# Patient Record
Sex: Male | Born: 1960 | Race: Black or African American | Hispanic: No | Marital: Single | Smoking: Former smoker
Health system: Southern US, Community
[De-identification: ages and names within clinical notes are randomized; demographics above are authoritative.]

## PROBLEM LIST (undated history)

## (undated) DIAGNOSIS — N186 End stage renal disease: Secondary | ICD-10-CM

## (undated) DIAGNOSIS — I739 Peripheral vascular disease, unspecified: Secondary | ICD-10-CM

## (undated) DIAGNOSIS — E119 Type 2 diabetes mellitus without complications: Secondary | ICD-10-CM

## (undated) DIAGNOSIS — I10 Essential (primary) hypertension: Secondary | ICD-10-CM

## (undated) DIAGNOSIS — Z992 Dependence on renal dialysis: Secondary | ICD-10-CM

## (undated) HISTORY — DX: Dependence on renal dialysis: Z99.2

## (undated) HISTORY — DX: Peripheral vascular disease, unspecified: I73.9

## (undated) HISTORY — DX: End stage renal disease: N18.6

## (undated) HISTORY — PX: PTCA: SHX146

---

## 2007-07-04 ENCOUNTER — Ambulatory Visit: Payer: Self-pay | Admitting: Family Medicine

## 2007-07-11 ENCOUNTER — Ambulatory Visit: Payer: Self-pay | Admitting: Family Medicine

## 2007-12-26 ENCOUNTER — Ambulatory Visit: Payer: Self-pay | Admitting: Family Medicine

## 2008-01-03 ENCOUNTER — Ambulatory Visit: Payer: Self-pay | Admitting: Family Medicine

## 2008-01-16 ENCOUNTER — Ambulatory Visit: Payer: Self-pay | Admitting: Family Medicine

## 2008-03-11 ENCOUNTER — Emergency Department (HOSPITAL_COMMUNITY): Admission: EM | Admit: 2008-03-11 | Discharge: 2008-03-11 | Payer: Self-pay | Admitting: Emergency Medicine

## 2009-04-30 ENCOUNTER — Ambulatory Visit: Payer: Self-pay | Admitting: Family Medicine

## 2009-11-28 ENCOUNTER — Ambulatory Visit: Payer: Self-pay | Admitting: Family Medicine

## 2011-09-06 LAB — POCT CARDIAC MARKERS: Troponin i, poc: <0.05

## 2011-09-06 LAB — COMPREHENSIVE METABOLIC PANEL
AST: 30
Albumin: 3.7
Alkaline Phosphatase: 48
Chloride: 102
GFR calc Af Amer: >60
Potassium: 3.9
Sodium: 137
Total Bilirubin: 0.9

## 2011-09-06 LAB — URINE CULTURE

## 2011-09-06 LAB — URINALYSIS, ROUTINE W REFLEX MICROSCOPIC
Bilirubin Urine: NEGATIVE
Nitrite: NEGATIVE
Specific Gravity, Urine: 1.019
pH: 6.5

## 2011-09-06 LAB — URINE MICROSCOPIC-ADD ON

## 2011-09-06 LAB — DIFFERENTIAL
Basophils Absolute: 0
Basophils Relative: 0
Eosinophils Relative: 0
Lymphocytes Relative: 20
Monocytes Absolute: 0.2

## 2011-09-06 LAB — CBC
RBC: 4.11 — ABNORMAL LOW
WBC: 11.8 — ABNORMAL HIGH

## 2011-09-06 LAB — CK: Total CK: 896 — ABNORMAL HIGH

## 2012-09-30 ENCOUNTER — Emergency Department (HOSPITAL_COMMUNITY)
Admission: EM | Admit: 2012-09-30 | Discharge: 2012-10-01 | Disposition: A | Discharge status: Home / Self Care | Payer: Self-pay | Attending: Emergency Medicine | Admitting: Emergency Medicine

## 2012-09-30 ENCOUNTER — Encounter (HOSPITAL_COMMUNITY): Payer: Self-pay

## 2012-09-30 DIAGNOSIS — I10 Essential (primary) hypertension: Secondary | ICD-10-CM | POA: Insufficient documentation

## 2012-09-30 DIAGNOSIS — M1711 Unilateral primary osteoarthritis, right knee: Secondary | ICD-10-CM

## 2012-09-30 DIAGNOSIS — F17200 Nicotine dependence, unspecified, uncomplicated: Secondary | ICD-10-CM | POA: Insufficient documentation

## 2012-09-30 DIAGNOSIS — Z79899 Other long term (current) drug therapy: Secondary | ICD-10-CM | POA: Insufficient documentation

## 2012-09-30 DIAGNOSIS — E119 Type 2 diabetes mellitus without complications: Secondary | ICD-10-CM | POA: Insufficient documentation

## 2012-09-30 DIAGNOSIS — M1710 Unilateral primary osteoarthritis, unspecified knee: Secondary | ICD-10-CM | POA: Insufficient documentation

## 2012-09-30 HISTORY — DX: Type 2 diabetes mellitus without complications: E11.9

## 2012-09-30 HISTORY — DX: Essential (primary) hypertension: I10

## 2012-09-30 LAB — GLUCOSE, CAPILLARY: Glucose-Capillary: 148 mg/dL — ABNORMAL HIGH (ref 70–99)

## 2012-09-30 NOTE — ED Notes (Signed)
Pt reports (R) knee pain x1 week, pt denies any injury, pt walking w/a limp in triage.

## 2012-10-01 ENCOUNTER — Emergency Department (HOSPITAL_COMMUNITY): Payer: Self-pay

## 2012-10-01 NOTE — ED Provider Notes (Signed)
History     CSN: [id number]  Arrival date & time 09/30/12  2339   None     Chief Complaint  Patient presents with  . Knee Pain    (Consider location/radiation/quality/duration/timing/severity/associated sxs/prior treatment) HPI History provided by pt.   Pt has had non-traumatic pain in right knee for the past week.  Aggravated by flexion and bearing weight.  Associated w/ edema; no paresthesias, fever, skin changes.  Denies having pain in any other joints.    Past Medical History  Diagnosis Date  . Renal disorder   . Hypertension   . Diabetes mellitus without complication     History reviewed. No pertinent past surgical history.  History reviewed. No pertinent family history.  History  Substance Use Topics  . Smoking status: Current Every Day Smoker  . Smokeless tobacco: Not on file  . Alcohol Use: No      Review of Systems  All other systems reviewed and are negative.    Allergies  Review of patient's allergies indicates no known allergies.  Home Medications   Current Outpatient Rx  Name Route Sig Dispense Refill  . AMLODIPINE BESYLATE 5 MG PO TABS Oral Take 5 mg by mouth daily.    . ASPIRIN EC 81 MG PO TBEC Oral Take 81 mg by mouth daily.    . ENALAPRIL MALEATE 10 MG PO TABS Oral Take 10 mg by mouth daily.    . ERGOCALCIFEROL 50000 UNITS PO CAPS Oral Take 50,000 Units by mouth once a week. Usually on Saturday    . GLYBURIDE 5 MG PO TABS Oral Take 5 mg by mouth daily with breakfast.    . RENA-VITE PO TABS Oral Take 1 tablet by mouth daily.    . TROLAMINE SALICYLATE 10 % EX CREA Topical Apply 1 application topically 3 (three) times daily as needed. arthriitis pain      BP 172/86  Pulse 112  Temp 98.4 F (36.9 C) (Oral)  Resp 18  SpO2 96%  Physical Exam  Nursing note and vitals reviewed. Constitutional: He is oriented to person, place, and time. He appears well-developed and well-nourished. No distress.  HENT:  Head: Normocephalic and atraumatic.    Eyes:       Normal appearance  Neck: Normal range of motion.  Pulmonary/Chest: Effort normal.  Musculoskeletal: Normal range of motion.       Right knee w/out edema, erythema or other skin changes and is not hot to the touch.  Tenderness medial and lateral to patella.  No edema or tenderness of calf.  Full but painful passive flexion.  2+ DP pulse and distal sensation intact.    Neurological: He is alert and oriented to person, place, and time.  Psychiatric: He has a normal mood and affect. His behavior is normal.    ED Course  Procedures (including critical care time)  Labs Reviewed  GLUCOSE, CAPILLARY - Abnormal; Notable for the following:    Glucose-Capillary 148 (*)     All other components within normal limits   Dg Knee 2 Views Right  10/01/2012  *RADIOLOGY REPORT*  Clinical Data: Right knee pain and swelling.  RIGHT KNEE - 1-2 VIEW  Comparison: None.  Findings: Degenerative changes in the right knee with mild medial compartment narrowing.  Osteophytes of the ligamentous insertions on the patella.  No evidence of acute fracture or subluxation.  No focal bone lesion or bone destruction.  Bone cortex and trabecular architecture appear intact.  No significant effusion.  Vascular calcifications.  IMPRESSION: Mild degenerative changes in the right knee.  No acute fractures.   Original Report Authenticated By: Neale Burly, M.D.      1. Arthritis of right knee       MDM  51yo M presents w/ non-traumatic R knee pain.  Xray neg  Other than mild degenerative changes, and no signs of joint infection on exam.  Ortho tech placed in knee sleeve and provided w/ crutches.  Recommended NSAID and RICE.  Referred to ortho for persistent/worsening sx.  Return precautions discussed.         Remer Macho, [place] 10/01/12 [phone]

## 2012-10-01 NOTE — ED Notes (Signed)
Patient currently sitting up in bed; no respiratory or acute distress noted.  Patient updated on plan of care; informed patient that we are currently waiting on disposition from EDP/PA.  Patient denies needs at this time; will continue to monitor.

## 2012-10-01 NOTE — ED Notes (Signed)
Patient currently resting quietly in bed; no respiratory or acute distress noted.  Patient updated on plan of care; informed patient that we are currently waiting for x-ray results to come back.  Patient has no other questions or concerns at this time; will continue to monitor.

## 2012-10-01 NOTE — ED Notes (Signed)
Ortho tech at bedside 

## 2012-10-01 NOTE — ED Notes (Signed)
Patient given copy of discharge paperwork; went over discharge instructions with patient.  Patient instructed to take pain medication (ibuprofen) as directed, to follow up with orthopedist/referral, and to return to the ED for new, worsening, or concerning symptoms.

## 2012-10-01 NOTE — ED Provider Notes (Signed)
Medical screening examination/treatment/procedure(s) were performed by non-physician practitioner and as supervising physician I was immediately available for consultation/collaboration.  Elyn Peers, MD 10/01/12 [phone]

## 2012-10-01 NOTE — ED Notes (Signed)
Patient complaining of right knee pain that started around one week ago; denies any recent injury to knee.  Patient denies swelling and redness.  Limited movement of right knee/leg due to pain.  Patient reports that movement of knee increases pain; currently rates pain 7/10 on the numerical pain scale.  Patient alert and oriented x4; PERRL present.  Will continue to monitor.

## 2012-10-01 NOTE — ED Notes (Signed)
Paged ortho for knee sleeve and crutches (patient states that he is 5'9").  Ortho on way.

## 2016-01-19 ENCOUNTER — Ambulatory Visit (INDEPENDENT_AMBULATORY_CARE_PROVIDER_SITE_OTHER): Payer: Medicaid Other | Admitting: Sports Medicine

## 2016-01-19 ENCOUNTER — Encounter: Payer: Self-pay | Admitting: Sports Medicine

## 2016-01-19 DIAGNOSIS — M79676 Pain in unspecified toe(s): Secondary | ICD-10-CM | POA: Diagnosis not present

## 2016-01-19 DIAGNOSIS — B351 Tinea unguium: Secondary | ICD-10-CM | POA: Diagnosis not present

## 2016-01-19 DIAGNOSIS — E119 Type 2 diabetes mellitus without complications: Secondary | ICD-10-CM

## 2016-01-19 NOTE — Patient Instructions (Addendum)
RECOMMEND O'KEEFFE'S HEALTHY FEET CREAM FOR DRY SKIN  Diabetes and Foot Care Diabetes may cause you to have problems because of poor blood supply (circulation) to your feet and legs. This may cause the skin on your feet to become thinner, break easier, and heal more slowly. Your skin may become dry, and the skin may peel and crack. You may also have nerve damage in your legs and feet causing decreased feeling in them. You may not notice minor injuries to your feet that could lead to infections or more serious problems. Taking care of your feet is one of the most important things you can do for yourself.  HOME CARE INSTRUCTIONS  Wear shoes at all times, even in the house. Do not go barefoot. Bare feet are easily injured.  Check your feet daily for blisters, cuts, and redness. If you cannot see the bottom of your feet, use a mirror or ask someone for help.  Wash your feet with warm water (do not use hot water) and mild soap. Then pat your feet and the areas between your toes until they are completely dry. Do not soak your feet as this can dry your skin.  Apply a moisturizing lotion or petroleum jelly (that does not contain alcohol and is unscented) to the skin on your feet and to dry, brittle toenails. Do not apply lotion between your toes.  Trim your toenails straight across. Do not dig under them or around the cuticle. File the edges of your nails with an emery board or nail file.  Do not cut corns or calluses or try to remove them with medicine.  Wear clean socks or stockings every day. Make sure they are not too tight. Do not wear knee-high stockings since they may decrease blood flow to your legs.  Wear shoes that fit properly and have enough cushioning. To break in new shoes, wear them for just a few hours a day. This prevents you from injuring your feet. Always look in your shoes before you put them on to be sure there are no objects inside.  Do not cross your legs. This may decrease the  blood flow to your feet.  If you find a minor scrape, cut, or break in the skin on your feet, keep it and the skin around it clean and dry. These areas may be cleansed with mild soap and water. Do not cleanse the area with peroxide, alcohol, or iodine.  When you remove an adhesive bandage, be sure not to damage the skin around it.  If you have a wound, look at it several times a day to make sure it is healing.  Do not use heating pads or hot water bottles. They may burn your skin. If you have lost feeling in your feet or legs, you may not know it is happening until it is too late.  Make sure your health care provider performs a complete foot exam at least annually or more often if you have foot problems. Report any cuts, sores, or bruises to your health care provider immediately. SEEK MEDICAL CARE IF:   You have an injury that is not healing.  You have cuts or breaks in the skin.  You have an ingrown nail.  You notice redness on your legs or feet.  You feel burning or tingling in your legs or feet.  You have pain or cramps in your legs and feet.  Your legs or feet are numb.  Your feet always feel cold. SEEK IMMEDIATE  MEDICAL CARE IF:   There is increasing redness, swelling, or pain in or around a wound.  There is a red line that goes up your leg.  Pus is coming from a wound.  You develop a fever or as directed by your health care provider.  You notice a bad smell coming from an ulcer or wound.   This information is not intended to replace advice given to you by your health care provider. Make sure you discuss any questions you have with your health care provider.   Document Released: 11/26/2000 Document Revised: 08/01/2013 Document Reviewed: 05/08/2013 Elsevier Interactive Patient Education [employer].

## 2016-01-19 NOTE — Progress Notes (Signed)
Patient ID: Perry Thomas, male   DOB: March 02, 1961, 55 y.o.   MRN: [id number] Subjective: Perry Thomas is a 55 y.o. male patient with history of type 2 diabetes who presents to office today complaining of long, painful nails  while  ambulating in shoes; unable to trim. Patient states that the glucose reading this morning was 98 mg/dl.  Patient denies any new changes in medication or new problems. Patient denies any new cramping, numbness, burning or tingling in the legs.  There are no active problems to display for this patient.  Current Outpatient Prescriptions on File Prior to Visit  Medication Sig Dispense Refill  . amLODipine (NORVASC) 5 MG tablet Take 5 mg by mouth daily.    Marland Kitchen aspirin EC 81 MG tablet Take 81 mg by mouth daily.    . enalapril (VASOTEC) 10 MG tablet Take 10 mg by mouth daily.    . ergocalciferol (VITAMIN D2) 50000 UNITS capsule Take 50,000 Units by mouth once a week. Usually on Saturday    . glyBURIDE (DIABETA) 5 MG tablet Take 5 mg by mouth daily with breakfast.    . multivitamin (RENA-VIT) TABS tablet Take 1 tablet by mouth daily.    Marland Kitchen trolamine salicylate (ASPERCREME) 10 % cream Apply 1 application topically 3 (three) times daily as needed. arthriitis pain     No current facility-administered medications on file prior to visit.   No Known Allergies   Objective: General: Patient is awake, alert, and oriented x 3 and in no acute distress.  Integument: Skin is warm, dry and supple bilateral. Nails are tender, long, thickened and  dystrophic with minimal subungual debris, 1-5 bilateral. No signs of infection. No open lesions or preulcerative lesions present bilateral. Remaining integument unremarkable.  Vasculature:  Dorsalis Pedis pulse 2/4 bilateral. Posterior Tibial pulse  1/4 bilateral.  Capillary fill time <3 sec 1-5 bilateral. Positive hair growth to the level of the digits. Temperature gradient within normal limits. No varicosities present bilateral. No edema  present bilateral.   Neurology: The patient has intact sensation measured with a 5.07/10g Semmes Weinstein Monofilament at all pedal sites bilateral . Vibratory sensation intact bilateral with tuning fork. No Babinski sign present bilateral.   Musculoskeletal: No gross pedal deformities noted bilateral. Muscular strength 5/5 in all lower extremity muscular groups bilateral without pain or limitation on range of motion . No tenderness with calf compression bilateral.  Assessment and Plan: Problem List Items Addressed This Visit    None    Visit Diagnoses    Pain due to onychomycosis of toenail    -  Primary    Diabetes mellitus without complication ([place])          -Examined patient. -Discussed and educated patient on diabetic foot care, especially with  regards to the vascular, neurological and musculoskeletal systems.  -Stressed the importance of good glycemic control and the detriment of not  controlling glucose levels in relation to the foot. -Mechanically debrided all nails 1-5 bilateral using sterile nail nipper and filed with dremel without incident  -Answered all patient questions -Patient to return in 3 months for at risk foot care -Patient advised to call the office if any problems or questions arise in the meantime.  Landis Martins, DPM

## 2016-01-30 ENCOUNTER — Ambulatory Visit (INDEPENDENT_AMBULATORY_CARE_PROVIDER_SITE_OTHER): Payer: Medicaid Other | Admitting: Internal Medicine

## 2016-01-30 ENCOUNTER — Encounter: Payer: Self-pay | Admitting: Internal Medicine

## 2016-01-30 VITALS — BP 136/67 | HR 84 | Temp 98.1°F | Ht 69.0 in | Wt 229.8 lb

## 2016-01-30 DIAGNOSIS — Z992 Dependence on renal dialysis: Secondary | ICD-10-CM

## 2016-01-30 DIAGNOSIS — E119 Type 2 diabetes mellitus without complications: Secondary | ICD-10-CM

## 2016-01-30 DIAGNOSIS — D649 Anemia, unspecified: Secondary | ICD-10-CM | POA: Diagnosis not present

## 2016-01-30 DIAGNOSIS — E1122 Type 2 diabetes mellitus with diabetic chronic kidney disease: Secondary | ICD-10-CM | POA: Diagnosis not present

## 2016-01-30 DIAGNOSIS — N186 End stage renal disease: Secondary | ICD-10-CM | POA: Diagnosis not present

## 2016-01-30 DIAGNOSIS — E1151 Type 2 diabetes mellitus with diabetic peripheral angiopathy without gangrene: Secondary | ICD-10-CM | POA: Diagnosis not present

## 2016-01-30 DIAGNOSIS — I152 Hypertension secondary to endocrine disorders: Secondary | ICD-10-CM

## 2016-01-30 DIAGNOSIS — I120 Hypertensive chronic kidney disease with stage 5 chronic kidney disease or end stage renal disease: Secondary | ICD-10-CM | POA: Diagnosis not present

## 2016-01-30 DIAGNOSIS — I739 Peripheral vascular disease, unspecified: Secondary | ICD-10-CM | POA: Insufficient documentation

## 2016-01-30 DIAGNOSIS — IMO0002 Reserved for concepts with insufficient information to code with codable children: Secondary | ICD-10-CM

## 2016-01-30 DIAGNOSIS — E1159 Type 2 diabetes mellitus with other circulatory complications: Secondary | ICD-10-CM

## 2016-01-30 DIAGNOSIS — E1165 Type 2 diabetes mellitus with hyperglycemia: Secondary | ICD-10-CM

## 2016-01-30 DIAGNOSIS — I10 Essential (primary) hypertension: Secondary | ICD-10-CM | POA: Insufficient documentation

## 2016-01-30 DIAGNOSIS — Z794 Long term (current) use of insulin: Secondary | ICD-10-CM

## 2016-01-30 HISTORY — DX: End stage renal disease: N18.6

## 2016-01-30 HISTORY — DX: Hypertension secondary to endocrine disorders: I15.2

## 2016-01-30 HISTORY — DX: Dependence on renal dialysis: Z99.2

## 2016-01-30 HISTORY — DX: Type 2 diabetes mellitus with other circulatory complications: E11.59

## 2016-01-30 HISTORY — DX: Type 2 diabetes mellitus without complications: E11.9

## 2016-01-30 LAB — POCT GLYCOSYLATED HEMOGLOBIN (HGB A1C): Hemoglobin A1C: 5.4

## 2016-01-30 LAB — GLUCOSE, CAPILLARY: Glucose-Capillary: 169 mg/dL — ABNORMAL HIGH (ref 65–99)

## 2016-01-30 MED ORDER — CLOPIDOGREL BISULFATE 75 MG PO TABS: 75.0000 mg | ORAL_TABLET | Freq: Every day | ORAL | Status: DC

## 2016-01-30 MED ORDER — CINACALCET HCL 30 MG PO TABS: 30.0000 mg | ORAL_TABLET | Freq: Every day | ORAL | Status: DC

## 2016-01-30 NOTE — Progress Notes (Signed)
   Subjective:   Patient ID: Perry Thomas male   DOB: 1961-07-08 55 y.o.   MRN: [id number]  HPI: [name] is a 55 y.o. male w/ PMHx of HTN ESRD in HD, and DM type II, presents to the clinic today for a new patient visit. Patient has been living in [place] for a little while now and is generally looked after by his kidney doctor. The patient was started on HD roughly ago, since that time he feels like he has been doing quite well. He has lost ~50 lbs over the past 1 year intentionally, has been exercising, and attempting to eat healthy foods. He was recently told he is no longer diabetic. He has no significant complaints. He used to have claudication a while back but states that he had a catheter procedure to help with this and since that time he has been taking Plavix. He does not report any stents being placed at that time. He goes to HD every TTS and tolerates HD very well. Since he started dialysis he has also not required any BP medications.   The patient does not drink alcohol, smoke, or use illicit drugs. He is not aware of his family history.   Past Medical History  Diagnosis Date  . Hypertension   . Diabetes mellitus without complication ([place])   . ESRD on dialysis [facility])     No family history on file.  Current Outpatient Prescriptions  Medication Sig Dispense Refill  . cinacalcet (SENSIPAR) 30 MG tablet Take 1 tablet (30 mg total) by mouth daily. 60 tablet   . clopidogrel (PLAVIX) 75 MG tablet Take 1 tablet (75 mg total) by mouth daily.    . multivitamin (RENA-VIT) TABS tablet Take 1 tablet by mouth daily.     No current facility-administered medications for this visit.    Review of Systems: General: Denies fever, chills, diaphoresis, appetite change and fatigue.  Respiratory: Denies SOB, DOE, cough, and wheezing.   Cardiovascular: Denies chest pain and palpitations.  Gastrointestinal: Denies nausea, vomiting, abdominal pain, and diarrhea.  Genitourinary: Denies  dysuria, increased frequency, and flank pain. Endocrine: Denies hot or cold intolerance, polyuria, and polydipsia. Musculoskeletal: Denies myalgias, back pain, joint swelling, arthralgias and gait problem.  Skin: Denies pallor, rash and wounds.  Neurological: Denies dizziness, seizures, syncope, weakness, lightheadedness, numbness and headaches.  Psychiatric/Behavioral: Denies mood changes, and sleep disturbances.  Objective:   Physical Exam: Filed Vitals:   01/30/16 1428  BP: 136/67  Pulse: 84  Temp: 98.1 F (36.7 C)  TempSrc: Oral  Height: 5\' 9"  (1.753 m)  Weight: 229 lb 12.8 oz (104.237 kg)  SpO2: 99%    General: Overweight [place] male, alert, cooperative, NAD. HEENT: PERRL, EOMI. Moist mucus membranes. Wears glasses.  Neck: Full range of motion without pain, supple, no lymphadenopathy or carotid bruits Lungs: Clear to ascultation bilaterally, normal work of respiration, no wheezes, rales, rhonchi Heart: RRR, no murmurs, gallops, or rubs Abdomen: Soft, non-tender, non-distended, BS + Extremities: No cyanosis, clubbing, or edema. LUE AVF with good thrill/bruit. Neurologic: Alert & oriented X3, cranial nerves II-XII intact, strength grossly intact, sensation intact to light touch   Assessment & Plan:   Please see problem based assessment and plan.

## 2016-01-30 NOTE — Patient Instructions (Signed)
1. Please return for follow up appointment in 6 weeks.   2. Please take all medications as previously prescribed.  I will call you if we need to make any changes to your medications.   3. If you have worsening of your symptoms or new symptoms arise, please call the clinic [phone]), or go to the ER immediately if symptoms are severe.

## 2016-01-31 LAB — CBC WITH DIFFERENTIAL/PLATELET
BASOS ABS: 0 10*3/uL (ref 0.0–0.2)
Basos: 1 %
EOS (ABSOLUTE): 0.2 10*3/uL (ref 0.0–0.4)
EOS: 3 %
HEMATOCRIT: 37.6 % (ref 37.5–51.0)
HEMOGLOBIN: 11.8 g/dL — AB (ref 12.6–17.7)
IMMATURE GRANS (ABS): 0 10*3/uL (ref 0.0–0.1)
IMMATURE GRANULOCYTES: 0 %
LYMPHS: 30 %
Lymphocytes Absolute: 1.7 10*3/uL (ref 0.7–3.1)
MCH: 30.4 pg (ref 26.6–33.0)
MCHC: 31.4 g/dL — AB (ref 31.5–35.7)
MCV: 97 fL (ref 79–97)
MONOCYTES: 7 %
Monocytes Absolute: 0.4 10*3/uL (ref 0.1–0.9)
NEUTROS PCT: 59 %
Neutrophils Absolute: 3.3 10*3/uL (ref 1.4–7.0)
PLATELETS: 246 10*3/uL (ref 150–379)
RBC: 3.88 x10E6/uL — AB (ref 4.14–5.80)
RDW: 14.2 % (ref 12.3–15.4)
WBC: 5.6 10*3/uL (ref 3.4–10.8)

## 2016-01-31 LAB — BMP8+ANION GAP
Anion Gap: 24 mmol/L — ABNORMAL HIGH (ref 10.0–18.0)
BUN / CREAT RATIO: 7 — AB (ref 9–20)
BUN: 65 mg/dL — AB (ref 6–24)
CALCIUM: 8.9 mg/dL (ref 8.7–10.2)
CHLORIDE: 92 mmol/L — AB (ref 96–106)
CO2: 26 mmol/L (ref 18–29)
CREATININE: 9.88 mg/dL — AB (ref 0.76–1.27)
GFR calc Af Amer: 6 mL/min/{1.73_m2} — ABNORMAL LOW (ref >59)
GFR calc non Af Amer: 5 mL/min/{1.73_m2} — ABNORMAL LOW (ref >59)
Glucose: 199 mg/dL — ABNORMAL HIGH (ref 65–99)
Potassium: 5 mmol/L (ref 3.5–5.2)
Sodium: 142 mmol/L (ref 134–144)

## 2016-01-31 LAB — MAGNESIUM: MAGNESIUM: 2.4 mg/dL — AB (ref 1.6–2.3)

## 2016-01-31 LAB — PHOSPHORUS: PHOSPHORUS: 4.6 mg/dL — AB (ref 2.5–4.5)

## 2016-02-02 NOTE — Assessment & Plan Note (Signed)
Goes to HD every TTS. Does not have any issues. CBC with mild normocytic anemia.  -Continue Sensipar, MV

## 2016-02-02 NOTE — Assessment & Plan Note (Signed)
Current HbA1c 5.4. No further intervention at this time.

## 2016-02-02 NOTE — Progress Notes (Signed)
Internal Medicine Clinic Attending  Case discussed with Dr. Jones at the time of the visit.  We reviewed the resident's history and exam and pertinent patient test results.  I agree with the assessment, diagnosis, and plan of care documented in the resident's note.  

## 2016-02-02 NOTE — Assessment & Plan Note (Signed)
Patient normotensive today, states he has not required BP medications since starting HD about 1 year ago.  -Continue to monitor periodically. -BMP with no unexpected abnormalities

## 2016-02-02 NOTE — Assessment & Plan Note (Signed)
No recent claudication. Sounds like he has had angioplasty in the past in both legs, since that time he has been doing well. Takes Plavix daily for this.  -Continue Plavix

## 2016-02-11 LAB — HM DIABETES EYE EXAM

## 2016-03-03 ENCOUNTER — Other Ambulatory Visit: Payer: Self-pay | Admitting: Internal Medicine

## 2016-03-03 ENCOUNTER — Other Ambulatory Visit: Payer: Self-pay | Admitting: *Deleted

## 2016-03-03 DIAGNOSIS — I739 Peripheral vascular disease, unspecified: Secondary | ICD-10-CM

## 2016-03-03 MED ORDER — CLOPIDOGREL BISULFATE 75 MG PO TABS: 75.0000 mg | ORAL_TABLET | Freq: Every day | ORAL | Status: DC

## 2016-03-03 NOTE — Telephone Encounter (Signed)
Last appt 2/17 ; next appt 3/31 with Dr Ronnald Ramp.

## 2016-03-04 LAB — HM DIABETES EYE EXAM

## 2016-03-10 ENCOUNTER — Telehealth: Payer: Self-pay | Admitting: Internal Medicine

## 2016-03-10 NOTE — Telephone Encounter (Signed)
APPT. REMINDER CALL, LMTCB °

## 2016-03-11 LAB — HM DIABETES EYE EXAM

## 2016-03-12 ENCOUNTER — Encounter: Payer: Self-pay | Admitting: Internal Medicine

## 2016-03-12 ENCOUNTER — Ambulatory Visit (INDEPENDENT_AMBULATORY_CARE_PROVIDER_SITE_OTHER): Payer: Medicaid Other | Admitting: Internal Medicine

## 2016-03-12 VITALS — BP 137/72 | HR 90 | Temp 98.3°F | Ht 69.0 in | Wt 230.6 lb

## 2016-03-12 DIAGNOSIS — E1165 Type 2 diabetes mellitus with hyperglycemia: Secondary | ICD-10-CM

## 2016-03-12 DIAGNOSIS — Z0000 Encounter for general adult medical examination without abnormal findings: Secondary | ICD-10-CM | POA: Insufficient documentation

## 2016-03-12 DIAGNOSIS — E1122 Type 2 diabetes mellitus with diabetic chronic kidney disease: Secondary | ICD-10-CM

## 2016-03-12 DIAGNOSIS — I120 Hypertensive chronic kidney disease with stage 5 chronic kidney disease or end stage renal disease: Secondary | ICD-10-CM | POA: Diagnosis not present

## 2016-03-12 DIAGNOSIS — Z992 Dependence on renal dialysis: Secondary | ICD-10-CM

## 2016-03-12 DIAGNOSIS — IMO0002 Reserved for concepts with insufficient information to code with codable children: Secondary | ICD-10-CM

## 2016-03-12 DIAGNOSIS — H0011 Chalazion right upper eyelid: Secondary | ICD-10-CM | POA: Diagnosis not present

## 2016-03-12 DIAGNOSIS — N186 End stage renal disease: Secondary | ICD-10-CM | POA: Diagnosis not present

## 2016-03-12 DIAGNOSIS — I10 Essential (primary) hypertension: Secondary | ICD-10-CM

## 2016-03-12 LAB — GLUCOSE, CAPILLARY: GLUCOSE-CAPILLARY: 114 mg/dL — AB (ref 65–99)

## 2016-03-12 NOTE — Assessment & Plan Note (Signed)
Patient follows with Podiatry, had flu shot this year, and states he had a colonoscopy (normal) in [place] within the past 2 years.  -Obtaining records from previous PCP in [facility]

## 2016-03-12 NOTE — Progress Notes (Signed)
Call to Dr. Jerald Kief office in [place], [place]. Y.  To ask if previous record release had been received.  Doctor does not accept faxes.  Record Release mailed to Dr. Serita Grit office.  Sander Nephew, RN 03/12/2016 11:48 AM

## 2016-03-12 NOTE — Assessment & Plan Note (Addendum)
Painless bump on the right upper eyelid. No discomfort, no drainage. Non-tender.  -Reassurance, continue to monitor -Follows with eye doctor, will make an appointment if it becomes an issue

## 2016-03-12 NOTE — Progress Notes (Signed)
   Subjective:   Patient ID: Perry Thomas male   DOB: 1961/08/27 55 y.o.   MRN: [id number]  HPI: [name] is a 55 y.o. male w/ PMHx of HTN ESRD in HD, and DM type II, presents to the clinic today for a follow up visit regarding his HTN and DM type II. Patient is doing well. Is only complaint is that of a small bump on the left upper eyelid that started about 2 weeks ago. Not painful, not associated with any drainage or discomfort. No current issues with HD, trying to exercise regularly, BP well controlled.   Past Medical History  Diagnosis Date  . Hypertension   . Diabetes mellitus without complication ([place])   . ESRD on dialysis [facility])      Current Outpatient Prescriptions  Medication Sig Dispense Refill  . cinacalcet (SENSIPAR) 30 MG tablet Take 1 tablet (30 mg total) by mouth daily. 60 tablet   . clopidogrel (PLAVIX) 75 MG tablet Take 1 tablet (75 mg total) by mouth daily. 30 tablet 2  . multivitamin (RENA-VIT) TABS tablet Take 1 tablet by mouth daily.     No current facility-administered medications for this visit.    Review of Systems: General: Denies fever, chills, diaphoresis, appetite change and fatigue.  Respiratory: Denies SOB, DOE, cough, and wheezing.   Cardiovascular: Denies chest pain and palpitations.  Gastrointestinal: Denies nausea, vomiting, abdominal pain, and diarrhea.  Genitourinary: Denies dysuria, increased frequency, and flank pain. Endocrine: Denies hot or cold intolerance, polyuria, and polydipsia. Musculoskeletal: Denies myalgias, back pain, joint swelling, arthralgias and gait problem.  Skin: Denies pallor, rash and wounds.  Neurological: Denies dizziness, seizures, syncope, weakness, lightheadedness, numbness and headaches.  Psychiatric/Behavioral: Denies mood changes, and sleep disturbances.  Objective:   Physical Exam: Filed Vitals:   03/12/16 1001  BP: 137/72  Pulse: 90  Temp: 98.3 F (36.8 C)  TempSrc: Oral  Height: 5\' 9"  (1.753 m)    Weight: 230 lb 9.6 oz (104.599 kg)  SpO2: 99%    General: Overweight [place] male, alert, cooperative, NAD. HEENT: PERRL, EOMI. Moist mucus membranes. Wears glasses.  Neck: Full range of motion without pain, supple, no lymphadenopathy or carotid bruits Lungs: Clear to ascultation bilaterally, normal work of respiration, no wheezes, rales, rhonchi Heart: RRR, no murmurs, gallops, or rubs Abdomen: Soft, non-tender, non-distended, BS + Extremities: No cyanosis, clubbing, or edema. LUE AVF with good thrill/bruit. Neurologic: Alert & oriented X3, cranial nerves II-XII intact, strength grossly intact, sensation intact to light touch   Assessment & Plan:   Please see problem based assessment and plan.

## 2016-03-12 NOTE — Assessment & Plan Note (Signed)
Well controlled, not currently on any medications. Discussed healthy diet, low sodium and exercise.

## 2016-03-12 NOTE — Assessment & Plan Note (Signed)
Patient follows with Dr. Carollee Leitz. No issues with HD aside from some mild cramps.  -HD per renal

## 2016-03-12 NOTE — Patient Instructions (Signed)
1. Please make a follow up appointment for 6 months.   2. Please take all medications as previously prescribed.  Make sure you start a regular exercise regimen.   3. If you have worsening of your symptoms or new symptoms arise, please call the clinic [phone]), or go to the ER immediately if symptoms are severe.  You have done a great job in taking all your medications. Please continue to do this.

## 2016-03-12 NOTE — Assessment & Plan Note (Signed)
Most recent HbA1c 5.4. Not currently on any medications. Diet controlled.  -Repeat in 6 months

## 2016-03-16 NOTE — Progress Notes (Signed)
Internal Medicine Clinic Attending  Case discussed with Dr. Jones at the time of the visit.  We reviewed the resident's history and exam and pertinent patient test results.  I agree with the assessment, diagnosis, and plan of care documented in the resident's note.  

## 2016-04-08 LAB — HM DIABETES EYE EXAM

## 2016-04-15 ENCOUNTER — Encounter: Payer: Self-pay | Admitting: *Deleted

## 2016-04-16 ENCOUNTER — Encounter: Payer: Self-pay | Admitting: Podiatry

## 2016-04-16 ENCOUNTER — Ambulatory Visit (INDEPENDENT_AMBULATORY_CARE_PROVIDER_SITE_OTHER): Payer: Medicaid Other | Admitting: Podiatry

## 2016-04-16 DIAGNOSIS — E119 Type 2 diabetes mellitus without complications: Secondary | ICD-10-CM

## 2016-04-16 DIAGNOSIS — M79676 Pain in unspecified toe(s): Secondary | ICD-10-CM

## 2016-04-16 DIAGNOSIS — B351 Tinea unguium: Secondary | ICD-10-CM

## 2016-04-16 NOTE — Progress Notes (Signed)
Patient ID: Perry Thomas, male   DOB: 08/03/1961, 55 y.o.   MRN: [id number] Complaint:  Visit Type: Patient returns to my office for continued preventative foot care services. Complaint: Patient states" my nails have grown long and thick and become painful to walk and wear shoes" Patient has been diagnosed with DM with no foot complications. Diet controlled. The patient presents for preventative foot care services. No changes to ROS  Podiatric Exam: Vascular: dorsalis pedis and posterior tibial pulses are palpable bilateral. Capillary return is immediate. Temperature gradient is WNL. Skin turgor WNL  Sensorium: Normal Semmes Weinstein monofilament test. Normal tactile sensation bilaterally. Nail Exam: Pt has thick disfigured discolored nails with subungual debris noted bilateral entire nail hallux through fifth toenails Ulcer Exam: There is no evidence of ulcer or pre-ulcerative changes or infection. Orthopedic Exam: Muscle tone and strength are WNL. No limitations in general ROM. No crepitus or effusions noted. Foot type and digits show no abnormalities. Bony prominences are unremarkable. Skin: No Porokeratosis. No infection or ulcers  Diagnosis:  Onychomycosis, , Pain in right toe, pain in left toes  Treatment & Plan Procedures and Treatment: Consent by patient was obtained for treatment procedures. The patient understood the discussion of treatment and procedures well. All questions were answered thoroughly reviewed. Debridement of mycotic and hypertrophic toenails, 1 through 5 bilateral and clearing of subungual debris. No ulceration, no infection noted. Recommended lamisil or lotrimin for thick skin in right arch. Return Visit-Office Procedure: Patient instructed to return to the office for a follow up visit 3 months for continued evaluation and treatment.    Shariece Viveiros DPM 

## 2016-04-26 ENCOUNTER — Encounter: Payer: Self-pay | Admitting: Internal Medicine

## 2016-04-26 ENCOUNTER — Ambulatory Visit: Payer: Medicaid Other | Admitting: Sports Medicine

## 2016-04-26 ENCOUNTER — Ambulatory Visit (INDEPENDENT_AMBULATORY_CARE_PROVIDER_SITE_OTHER): Payer: Medicaid Other | Admitting: Internal Medicine

## 2016-04-26 VITALS — BP 159/88 | HR 102 | Temp 98.2°F | Ht 69.0 in | Wt 231.9 lb

## 2016-04-26 DIAGNOSIS — J310 Chronic rhinitis: Secondary | ICD-10-CM | POA: Insufficient documentation

## 2016-04-26 DIAGNOSIS — J329 Chronic sinusitis, unspecified: Secondary | ICD-10-CM | POA: Insufficient documentation

## 2016-04-26 DIAGNOSIS — J309 Allergic rhinitis, unspecified: Secondary | ICD-10-CM

## 2016-04-26 MED ORDER — FLUTICASONE PROPIONATE 50 MCG/ACT NA SUSP: 2.0000 | Freq: Every day | NASAL | Status: DC

## 2016-04-26 MED ORDER — CETIRIZINE HCL 5 MG PO TABS: 5.0000 mg | ORAL_TABLET | Freq: Every day | ORAL | Status: DC

## 2016-04-26 NOTE — Patient Instructions (Signed)
General Instructions: - Start taking Cetirizine (Zyrtec) 5 mg daily  - Start using Flonase 2 sprays each nostril daily - Continue Mucinex and Saline nasal spray as needed - Try drinking hot tea to help with your sore throat. As you use these medicines this will help your post-nasal drip that is causing your sore throat.  Please bring your medicines with you each time you come to clinic.  Medicines may include prescription medications, over-the-counter medications, herbal remedies, eye drops, vitamins, or other pills.   Progress Toward Treatment Goals:  No flowsheet data found.  Self Care Goals & Plans:  Self Care Goal 04/26/2016  Manage my medications take my medicines as prescribed; bring my medications to every visit  Monitor my health keep track of my blood glucose; bring my glucose meter and log to each visit; keep track of my blood pressure  Eat healthy foods eat foods that are low in salt; eat more vegetables; eat baked foods instead of fried foods  Be physically active find an activity I enjoy  Meeting treatment goals -    No flowsheet data found.   Care Management & Community Referrals:  Referral 03/12/2016  Referrals made for care management support [facility] case management program

## 2016-04-26 NOTE — Assessment & Plan Note (Signed)
Recommended for him to start Cetirizine 5 mg daily and Flonase 2 sprays per nostril daily. Advised him to only use Flonase temporarily while symptoms are bad. Also recommended to continue the saline nasal spray. Told him to return to clinic in the next 2 weeks if his symptoms worsen or have not improved.

## 2016-04-26 NOTE — Progress Notes (Signed)
   Subjective:    Patient ID: Perry Thomas, male    DOB: [birth year], 55 y.o.   MRN: [id number]  HPI Perry Thomas is a 55yo man with PMHx of HTN, PVD, ESRD on HD, and type 2 diabetes who presents today for evaluation of sinus congestion.  Reports over the last 1 week he has experienced nasal congestion and sore throat. He describes associated watery eyes, sneezing. He states his dialysis nurse told him to try Mucinex and saline nasal spray. He notes the mucinex has not been helping, but the saline spray has been working. He denies sinus tenderness, cough, headache, and ear pain. He reports he does get seasonal allergies, but never this bad.    Review of Systems General: Denies fever, chills, night sweats, changes in weight, changes in appetite HEENT: Denies changes in vision CV: Denies CP, palpitations, SOB, orthopnea Pulm: Denies SOB, cough, wheezing GI: Denies abdominal pain, nausea, vomiting, diarrhea, constipation, melena, hematochezia GU: Denies dysuria, hematuria, frequency Msk: Denies muscle cramps, joint pains Neuro: Denies weakness, numbness, tingling Skin: Denies rashes, bruising Psych: Denies depression, anxiety, hallucinations    Objective:   Physical Exam General: alert, sitting up, pleasant, NAD HEENT: [place]/AT, EOMI, sclera anicteric, pharynx non-erythematous, right tympanic membrane with mild bulging, left tympanic membrane normal, nares appear normal, no sinus tenderness to palpation Neck: supple, no lymphadenopathy CV: RRR, no m/g/r Pulm: CTA bilaterally, breaths non-labored Neuro: alert and oriented x 3     Assessment & Plan:  Please refer to A&P documentation.

## 2016-04-27 LAB — HM DIABETES EYE EXAM

## 2016-04-27 NOTE — Progress Notes (Signed)
Internal Medicine Clinic Attending  Case discussed with Dr. Rivet at the time of the visit.  We reviewed the resident's history and exam and pertinent patient test results.  I agree with the assessment, diagnosis, and plan of care documented in the resident's note.  

## 2016-05-26 ENCOUNTER — Ambulatory Visit (INDEPENDENT_AMBULATORY_CARE_PROVIDER_SITE_OTHER): Payer: Medicaid Other | Admitting: Internal Medicine

## 2016-05-26 ENCOUNTER — Encounter: Payer: Self-pay | Admitting: Internal Medicine

## 2016-05-26 VITALS — BP 147/71 | HR 100 | Temp 98.8°F | Wt 231.3 lb

## 2016-05-26 DIAGNOSIS — J329 Chronic sinusitis, unspecified: Secondary | ICD-10-CM

## 2016-05-26 DIAGNOSIS — J310 Chronic rhinitis: Secondary | ICD-10-CM

## 2016-05-26 DIAGNOSIS — N186 End stage renal disease: Secondary | ICD-10-CM

## 2016-05-26 DIAGNOSIS — Z992 Dependence on renal dialysis: Secondary | ICD-10-CM | POA: Diagnosis not present

## 2016-05-26 MED ORDER — AMOXICILLIN-POT CLAVULANATE 500-125 MG PO TABS: 1.0000 | ORAL_TABLET | Freq: Every day | ORAL | Status: DC

## 2016-05-26 NOTE — Progress Notes (Signed)
   Patient ID: Perry Thomas male   DOB: 1961-12-09 55 y.o.   MRN: [id number]  Subjective:   HPI: [name] is a 54 y.o. with PMH of HTN, PVD, ESRD on HD, seasonal allergies and type 2 diabetes who presents to [facility] today for evaluation of sinus drainage.   Please see problem-based charting for status of medical issues pertinent to this visit.  Review of Systems: Pertinent items noted in HPI and remainder of comprehensive ROS otherwise negative.  Objective:  Physical Exam: Filed Vitals:   05/26/16 1429  BP: 147/71  Pulse: 100  Temp: 98.8 F (37.1 C)  TempSrc: Oral  Weight: 231 lb 4.8 oz (104.917 kg)  SpO2: 100%   Gen: Well-appearing, alert and oriented to person, place, and time HEENT: Oropharynx clear without erythema or exudate. There is mild bilateral maxillary sinus pressure with palpation. No frontal sinus pressure to palpation nasal mucosa hyperemic without apparent drainage or edema. Neck: No cervical LAD, no thyromegaly or nodules, no JVD noted. CV: Normal rate, regular rhythm, no murmurs, rubs, or gallops Pulmonary: Normal effort, CTA bilaterally, no wheezing, rales, or rhonchi Abdominal: Soft, non-tender, non-distended, without rebound, guarding, or masses Extremities: Distal pulses 2+ in upper and lower extremities bilaterally, no tenderness, erythema or edema Skin: No atypical appearing moles. No rashes  Assessment & Plan:  Please see problem-based charting for assessment and plan.  Blane Ohara, MD Resident Physician, PGY-1 Department of Internal Medicine [facility]

## 2016-05-26 NOTE — Patient Instructions (Signed)
Mr. Merrifield,  Continue to use the nasal saline spray and the Zyrtec for your sinus congestion. These problems sometimes take a few more weeks to clear up totally, so continue to do those things even though it's not helping right away. Also, you can take Tylenol scheduled to help with some of the inflammation in your sinuses that's causing your symptoms. The most important new thing I want you to try is called a Nettie pot. Get that at your local pharmacy. Use cold filtered water like we talked about today in the nostril that is in draining the thick smelly mucus, tilts her head over a sink, and poor water in. It will help wash out and drain your sinuses. I don't want you to use antibiotics if the Nettie pot gets rid of your symptoms because they have been shown to not significantly improved symptoms and have possible side effects. However, if this doesn't work, I will have a prescription sent in for you to finish a course of antibiotics. Call our clinic if you have any other questions.  Thanks,  Blane Ohara

## 2016-05-26 NOTE — Assessment & Plan Note (Signed)
Patient says that he is going to start exercising at the [facility] before a stress test needs to be ordered by his PCP and next visit since he is working on his candidacy for a kidney transplant. He says, per Dr. Vanita Panda, that his PCP also needs to do a prostate exam and order the stress test at his next follow-up in early August. -Provided [employer] locations for patient -At next visit with Dr. Posey Pronto, please perform and record a prostate exam and order a stress test

## 2016-05-26 NOTE — Assessment & Plan Note (Signed)
Patient was evaluated 1 month ago by Dr. Arcelia Jew for bilateral rhinorrhea. He has continued to use the saline spray bilaterally and the cetirizine 5 mg daily. He says that this has helped only mildly over the past month. However, he presents again today because for the past 2 weeks she has noted worsening unilateral rhinorrhea that is now turned from clear initially too thick and yellow and foul smelling. He denies any worsening sinus pressure or congestion, no pain, no subjective fevers, no sore throat, cough or other associated symptoms, simply the worsening drainage unilaterally. He says that a doctor gave him an antibiotic last time and that steroid of his symptoms very quickly when this happened in the past. On exam, he does not have marketed sinus tenderness unilaterally and the rest of exam is unremarkable. However, he has been treated conservatively for a 4 week trial and given his worsening unilateral symptoms, I am at least slightly concerned that he may have a bacterial sinusitis. Regardless, maximizing conservative therapy is much more beneficial than simply adding on antibiotics. -Instructed patient to start using a Neti pot today. Counseled patient on proper use. He says he will pick this up at his pharmacy today -Also start acetaminophen scheduled. Will withhold NSAID S at this time given that he is ESRD -Continue cetirizine and saline nasal spray -Counseled patient that if his symptoms do not improve with the above measures over the next few days, that he may fill a 5 day course of ESRD dosed amoxicillin clavulanate 500 mg by mouth daily after dialysis on dialysis days given that he is at high risk for complications given his diabetes and dialysis status

## 2016-05-27 NOTE — Progress Notes (Signed)
Internal Medicine Clinic Attending  Case discussed with Dr. Kennedy at the time of the visit.  We reviewed the resident's history and exam and pertinent patient test results.  I agree with the assessment, diagnosis, and plan of care documented in the resident's note.  

## 2016-06-07 ENCOUNTER — Other Ambulatory Visit: Payer: Self-pay | Admitting: *Deleted

## 2016-06-07 DIAGNOSIS — I739 Peripheral vascular disease, unspecified: Secondary | ICD-10-CM

## 2016-06-07 MED ORDER — CLOPIDOGREL BISULFATE 75 MG PO TABS: 75.0000 mg | ORAL_TABLET | Freq: Every day | ORAL | Status: DC

## 2016-06-07 NOTE — Telephone Encounter (Signed)
Sept appt (+/- one month) with PCP

## 2016-06-30 LAB — HM DIABETES EYE EXAM

## 2016-07-12 LAB — HM DIABETES EYE EXAM

## 2016-07-14 ENCOUNTER — Ambulatory Visit (INDEPENDENT_AMBULATORY_CARE_PROVIDER_SITE_OTHER): Payer: Medicaid Other | Admitting: Internal Medicine

## 2016-07-14 ENCOUNTER — Encounter (INDEPENDENT_AMBULATORY_CARE_PROVIDER_SITE_OTHER): Payer: Self-pay

## 2016-07-14 ENCOUNTER — Encounter: Payer: Self-pay | Admitting: Internal Medicine

## 2016-07-14 VITALS — BP 125/72 | HR 85 | Temp 98.4°F | Ht 69.0 in | Wt 238.3 lb

## 2016-07-14 DIAGNOSIS — E1165 Type 2 diabetes mellitus with hyperglycemia: Secondary | ICD-10-CM

## 2016-07-14 DIAGNOSIS — N186 End stage renal disease: Secondary | ICD-10-CM | POA: Diagnosis not present

## 2016-07-14 DIAGNOSIS — Z0000 Encounter for general adult medical examination without abnormal findings: Secondary | ICD-10-CM

## 2016-07-14 DIAGNOSIS — Z992 Dependence on renal dialysis: Secondary | ICD-10-CM

## 2016-07-14 DIAGNOSIS — E1122 Type 2 diabetes mellitus with diabetic chronic kidney disease: Secondary | ICD-10-CM | POA: Diagnosis not present

## 2016-07-14 DIAGNOSIS — J3489 Other specified disorders of nose and nasal sinuses: Secondary | ICD-10-CM

## 2016-07-14 DIAGNOSIS — J309 Allergic rhinitis, unspecified: Secondary | ICD-10-CM | POA: Insufficient documentation

## 2016-07-14 DIAGNOSIS — I10 Essential (primary) hypertension: Secondary | ICD-10-CM

## 2016-07-14 DIAGNOSIS — I120 Hypertensive chronic kidney disease with stage 5 chronic kidney disease or end stage renal disease: Secondary | ICD-10-CM

## 2016-07-14 DIAGNOSIS — IMO0002 Reserved for concepts with insufficient information to code with codable children: Secondary | ICD-10-CM

## 2016-07-14 LAB — POCT GLYCOSYLATED HEMOGLOBIN (HGB A1C): Hemoglobin A1C: 5.5

## 2016-07-14 LAB — GLUCOSE, CAPILLARY: Glucose-Capillary: 141 mg/dL — ABNORMAL HIGH (ref 65–99)

## 2016-07-14 NOTE — Assessment & Plan Note (Signed)
Patient reports some continued clear rhinorrhea from his left nares. He was previously taking Cetirizine and Flonase for allergic rhinitis, but he has stopped these. He also was treated for rhinosinusitis with renal dosed Augmentin for 5 day course on dialysis days which he completed with relief. No sign of recurrent infection. Likely continue viral rhinitis vs seasonal allergies. -Resume Flonase 2 sprays in nares daily, decrease to 1 spray when symptoms start to improve

## 2016-07-14 NOTE — Assessment & Plan Note (Signed)
Patient reports he had a normal colonoscopy in [place] 2 years ago. Also reports following with eye specialists every 6 months for eye exams. Scanned document under media tab 01/29/16 shows a negative HCV ab collected on 01/13/16. -Will try to obtain records for colonoscopy and eye exams

## 2016-07-14 NOTE — Patient Instructions (Addendum)
It was a pleasure to meet you [name].  We will try to obtain records from Dr. Posey Thomas in [place] as well as your eye doctors.  I will contact the transplant doctors for any other tests they are requesting including a stress test.  Your Hgb A1c today is 5.5, you are doing a great job with this. Your blood pressure is also in good control.  Please continue your diet and start the exercise program at the [facility] as planned.  You can try using Flonase daily for your nasal allergies.

## 2016-07-14 NOTE — Assessment & Plan Note (Signed)
BP Readings from Last 3 Encounters:  07/14/16 125/72  05/26/16 (!) 147/71  04/26/16 (!) 159/88   BP 125/72 today. Well controlled. -Continue diet/exercise -Continue with plan for [employer] exercise program for dialysis patients

## 2016-07-14 NOTE — Assessment & Plan Note (Signed)
ESRD on TTS HD, tolerating well. He is a transplant candidate and has seen Transplant Specialists at [facility]. He reports that they have asked for a cardiac stress test and prostate exam for pre-transplant workup and evaluation. Per review of care everywhere Transplant Consult note, he has been recommended the following be obtained in [place]:  1. Cardiac Stress Test to risk stratify CAD in pre-transplant. (he wants this done after August). 2. CT abd/pelvis w/o IV contrast vs angiogram to evaluate iliac vasculature calcification in renal transplant patient (will need to clarify whether angiogram is preferred). 3. Annual prostate screening. PSA was 6 at [facility]. Their note states he was referred to [facility] [place]. Will check to see if patient has appointment there.  We will try to clarify and obtain the above as needed. We will also try to obtain his records from his prior PCP in [place], including Colonoscopy results which he states he had 2 years ago with normal results.

## 2016-07-14 NOTE — Progress Notes (Signed)
CC: Diabetes and ESRD  HPI:  [name] is a 55 y.o. male with PMH of ESRD on TTS HD, diet controlled T2DM, HTN, and PVD who presents for follow up management of his T2DM, HTN, and workup for renal transplant candidacy. Please see problem based charting for status of patient's chronic medical issues.  ESRD on TTS HD: Tolerating dialysis well other than occasional cramping. Reports that he has not missed any sessions. Follows with Dr. Carollee Thomas. Started dialysis 2 years ago. He was told his ESRD was due to HTN and DM. He reports that he still makes urine, about a pint per day. He is a transplant candidate and has seen Transplant Specialists at [facility]. He reports that they have asked for a cardiac stress test and prostate exam for pre-transplant workup and evaluation. Per review of care everywhere Transplant Consult note, he has been recommended the following be obtained in [place]:  1. Cardiac Stress Test to risk stratify CAD in pre-transplant. (he wants this done after August). 2. CT abd/pelvis w/o IV contrast vs angiogram to evaluate iliac vasculature calcification in renal transplant patient (will need to clarify whether angiogram is preferred). 3. Annual prostate screening. PSA was 6 at [facility]. Their note states he was referred to [facility] [place]. Will check to see if patient has appointment there.  We will also try to obtain his records from his prior PCP in [place], including Colonoscopy results which he states he had 2 years ago with normal results.  T2DM: Last Hgb A1c was 5.4 on 01/1716. He is diet-controlled, not on any medications for this. He reports previously taking an oral medication which caused him to become hypoglycemic. He reports a history of retinal detachment in his left eye and follows with eye specialists every 6 months. He reports cataract surgery in his left eye on July 5th as well as laser treatment. Will try to obtain records from his eye specialists.  HTN: BP  125/72. He is not on antihypertensive medications. Well controlled with diet. He will try to start an exercise program at the [facility] soon as well.  Rhinorrhea: Patient reports some continued clear rhinorrhea from his left nares. He was previously taking Cetirizine and Flonase for allergic rhinitis, but he has stopped these. He also was treated for rhinosinusitis with renal dosed Augmentin for 5 day course on dialysis days which he completed with relief.    Past Medical History:  Diagnosis Date  . Diabetes mellitus without complication ([place])   . ESRD on dialysis ([place])   . Hypertension     Review of Systems:   Review of Systems  Constitutional: Negative for malaise/fatigue and weight loss.  HENT:       Clear rhinorrhea  Respiratory: Negative for cough and shortness of breath.   Cardiovascular: Negative for chest pain, palpitations and leg swelling.  Gastrointestinal: Negative for abdominal pain.  Genitourinary: Negative for dysuria, hematuria and urgency.       Still makes some urine  Neurological: Negative for headaches.     Physical Exam:  Vitals:   07/14/16 1318  BP: 125/72  Pulse: 85  Temp: 98.4 F (36.9 C)  TempSrc: Oral  SpO2: 98%  Weight: 238 lb 4.8 oz (108.1 kg)  Height: 5\' 9"  (1.753 m)   Physical Exam  Constitutional: He is oriented to person, place, and time. He appears well-developed and well-nourished. No distress.  HENT:  Head: Normocephalic and atraumatic.  Nose: No mucosal edema, rhinorrhea or sinus tenderness. No epistaxis.  Cardiovascular: Normal rate and regular rhythm.  Exam reveals no gallop and no friction rub.   No murmur heard. AVF LUE with palpable thrill  Pulmonary/Chest: Effort normal. No respiratory distress. He has no wheezes. He has no rales.  Genitourinary: Prostate normal. Prostate is not enlarged and not tender.  Genitourinary Comments: No irregular nodules felt on prostate exam.  Musculoskeletal: Normal range of motion. He exhibits no  edema or tenderness.  Neurological: He is alert and oriented to person, place, and time.  Skin: He is not diaphoretic.    Assessment & Plan:   See Encounters Tab for problem based charting.   Patient discussed with Dr. Beryle Beams

## 2016-07-14 NOTE — Assessment & Plan Note (Signed)
Hgb A1C today is 5.5. Has good control off medications, although A1c may be lower than true glycemic control in setting of dialysis. -Continue diet and exercise for diabetic control. -Will try to obtain eye exam records

## 2016-07-15 ENCOUNTER — Telehealth: Payer: Self-pay | Admitting: *Deleted

## 2016-07-15 NOTE — Progress Notes (Signed)
Medicine attending: Medical history, presenting problems, physical findings, and medications, reviewed with resident physician Dr Vishal Patel on the day of the patient visit and I concur with his evaluation and management plan. 

## 2016-07-16 ENCOUNTER — Encounter: Payer: Self-pay | Admitting: Podiatry

## 2016-07-16 ENCOUNTER — Ambulatory Visit (INDEPENDENT_AMBULATORY_CARE_PROVIDER_SITE_OTHER): Payer: Medicaid Other | Admitting: Podiatry

## 2016-07-16 DIAGNOSIS — B351 Tinea unguium: Secondary | ICD-10-CM | POA: Diagnosis not present

## 2016-07-16 DIAGNOSIS — M79676 Pain in unspecified toe(s): Secondary | ICD-10-CM

## 2016-07-16 DIAGNOSIS — E119 Type 2 diabetes mellitus without complications: Secondary | ICD-10-CM

## 2016-07-16 NOTE — Progress Notes (Signed)
Patient ID: Perry Thomas, male   DOB: 11/23/1961, 55 y.o.   MRN: [id number] Complaint:  Visit Type: Patient returns to my office for continued preventative foot care services. Complaint: Patient states" my nails have grown long and thick and become painful to walk and wear shoes" Patient has been diagnosed with DM with no foot complications. Diet controlled. The patient presents for preventative foot care services. No changes to ROS  Podiatric Exam: Vascular: dorsalis pedis and posterior tibial pulses are palpable bilateral. Capillary return is immediate. Temperature gradient is WNL. Skin turgor WNL  Sensorium: Normal Semmes Weinstein monofilament test. Normal tactile sensation bilaterally. Nail Exam: Pt has thick disfigured discolored nails with subungual debris noted bilateral entire nail hallux through fifth toenails Ulcer Exam: There is no evidence of ulcer or pre-ulcerative changes or infection. Orthopedic Exam: Muscle tone and strength are WNL. No limitations in general ROM. No crepitus or effusions noted. Foot type and digits show no abnormalities. Bony prominences are unremarkable. Skin: No Porokeratosis. No infection or ulcers  Diagnosis:  Onychomycosis, , Pain in right toe, pain in left toes  Treatment & Plan Procedures and Treatment: Consent by patient was obtained for treatment procedures. The patient understood the discussion of treatment and procedures well. All questions were answered thoroughly reviewed. Debridement of mycotic and hypertrophic toenails, 1 through 5 bilateral and clearing of subungual debris. No ulceration, no infection noted. Recommended lamisil or lotrimin for thick skin in right arch. Return Visit-Office Procedure: Patient instructed to return to the office for a follow up visit 3 months for continued evaluation and treatment.    Joshawa Dubin DPM 

## 2016-07-21 ENCOUNTER — Other Ambulatory Visit: Payer: Self-pay | Admitting: Internal Medicine

## 2016-07-21 DIAGNOSIS — Z992 Dependence on renal dialysis: Principal | ICD-10-CM

## 2016-07-21 DIAGNOSIS — N186 End stage renal disease: Secondary | ICD-10-CM

## 2016-07-21 NOTE — Progress Notes (Signed)
Patient is ESRD on HD and a transplant candidate seen at [facility]. Received records from [facility] transplant requesting the following for pretransplant evaluation:  Urology referral to [facility] [place]. PSA was 6.0 at [facility]  Will place referral. Will need to send Urology results/records to the [facility].

## 2016-07-26 ENCOUNTER — Encounter: Payer: Self-pay | Admitting: *Deleted

## 2016-08-06 ENCOUNTER — Encounter: Payer: Self-pay | Admitting: *Deleted

## 2016-08-18 ENCOUNTER — Encounter: Payer: Medicaid Other | Admitting: Internal Medicine

## 2016-10-15 ENCOUNTER — Ambulatory Visit (INDEPENDENT_AMBULATORY_CARE_PROVIDER_SITE_OTHER): Payer: Medicaid Other | Admitting: Podiatry

## 2016-10-15 ENCOUNTER — Encounter: Payer: Self-pay | Admitting: Podiatry

## 2016-10-15 VITALS — Ht 69.0 in | Wt 238.0 lb

## 2016-10-15 DIAGNOSIS — M79676 Pain in unspecified toe(s): Secondary | ICD-10-CM | POA: Diagnosis not present

## 2016-10-15 DIAGNOSIS — E119 Type 2 diabetes mellitus without complications: Secondary | ICD-10-CM

## 2016-10-15 DIAGNOSIS — B351 Tinea unguium: Secondary | ICD-10-CM | POA: Diagnosis not present

## 2016-10-15 NOTE — Progress Notes (Signed)
Patient ID: Perry Thomas, male   DOB: Jan 21, 1961, 55 y.o.   MRN: [id number] Complaint:  Visit Type: Patient returns to my office for continued preventative foot care services. Complaint: Patient states" my nails have grown long and thick and become painful to walk and wear shoes" Patient has been diagnosed with DM with no foot complications. Diet controlled. The patient presents for preventative foot care services. No changes to ROS  Podiatric Exam: Vascular: dorsalis pedis and posterior tibial pulses are palpable bilateral. Capillary return is immediate. Temperature gradient is WNL. Skin turgor WNL  Sensorium: Normal Semmes Weinstein monofilament test. Normal tactile sensation bilaterally. Nail Exam: Pt has thick disfigured discolored nails with subungual debris noted bilateral entire nail hallux through fifth toenails Ulcer Exam: There is no evidence of ulcer or pre-ulcerative changes or infection. Orthopedic Exam: Muscle tone and strength are WNL. No limitations in general ROM. No crepitus or effusions noted. Foot type and digits show no abnormalities. Bony prominences are unremarkable. Skin: No Porokeratosis. No infection or ulcers  Diagnosis:  Onychomycosis, , Pain in right toe, pain in left toes  Treatment & Plan Procedures and Treatment: Consent by patient was obtained for treatment procedures. The patient understood the discussion of treatment and procedures well. All questions were answered thoroughly reviewed. Debridement of mycotic and hypertrophic toenails, 1 through 5 bilateral and clearing of subungual debris. No ulceration, no infection noted. Recommended lamisil or lotrimin for thick skin in right arch. Return Visit-Office Procedure: Patient instructed to return to the office for a follow up visit 3 months for continued evaluation and treatment.    Gardiner Barefoot DPM

## 2016-10-27 NOTE — Addendum Note (Signed)
Addended by: Hulan Fray on: 10/27/2016 04:57 PM   Modules accepted: Orders

## 2016-12-22 LAB — HM DIABETES EYE EXAM

## 2017-01-14 ENCOUNTER — Ambulatory Visit: Payer: Medicaid Other | Admitting: Podiatry

## 2017-01-20 ENCOUNTER — Encounter: Payer: Self-pay | Admitting: Podiatry

## 2017-01-20 ENCOUNTER — Ambulatory Visit (INDEPENDENT_AMBULATORY_CARE_PROVIDER_SITE_OTHER): Payer: Medicaid Other | Admitting: Podiatry

## 2017-01-20 VITALS — Ht 69.0 in | Wt 238.0 lb

## 2017-01-20 DIAGNOSIS — M79676 Pain in unspecified toe(s): Secondary | ICD-10-CM | POA: Diagnosis not present

## 2017-01-20 DIAGNOSIS — B351 Tinea unguium: Secondary | ICD-10-CM | POA: Diagnosis not present

## 2017-01-20 DIAGNOSIS — E119 Type 2 diabetes mellitus without complications: Secondary | ICD-10-CM

## 2017-01-20 NOTE — Progress Notes (Addendum)
Patient ID: Perry Thomas, male   DOB: May 09, 1961, 56 y.o.   MRN: [id number] Complaint:  Visit Type: Patient returns to my office for continued preventative foot care services. Complaint: Patient states" my nails have grown long and thick and become painful to walk and wear shoes" Patient has been diagnosed with DM with no foot complications. Diet controlled. The patient presents for preventative foot care services. No changes to ROS  Podiatric Exam: Vascular: dorsalis pedis and posterior tibial pulses are palpable bilateral. Capillary return is immediate. Temperature gradient is WNL. Skin turgor WNL  Sensorium: Normal Semmes Weinstein monofilament test. Normal tactile sensation bilaterally. Nail Exam: Pt has thick disfigured discolored nails with subungual debris noted bilateral entire nail hallux through fifth toenails Ulcer Exam: There is no evidence of ulcer or pre-ulcerative changes or infection. Orthopedic Exam: Muscle tone and strength are WNL. No limitations in general ROM. No crepitus or effusions noted. Foot type and digits show no abnormalities. Bony prominences are unremarkable. Skin: No Porokeratosis. No infection or ulcers  Diagnosis:  Onychomycosis, , Pain in right toe, pain in left toes  Treatment & Plan Procedures and Treatment: Consent by patient was obtained for treatment procedures. The patient understood the discussion of treatment and procedures well. All questions were answered thoroughly reviewed. Debridement of mycotic and hypertrophic toenails, 1 through 5 bilateral and clearing of subungual debris. No ulceration, no infection noted. Return Visit-Office Procedure: Patient instructed to return to the office for a follow up visit 3 months for continued evaluation and treatment.    Gardiner Barefoot DPM

## 2017-01-21 ENCOUNTER — Ambulatory Visit: Payer: Medicaid Other | Admitting: Podiatry

## 2017-01-31 ENCOUNTER — Telehealth: Payer: Self-pay | Admitting: Internal Medicine

## 2017-01-31 NOTE — Telephone Encounter (Signed)
FYI only.  Rec'd fax from [facility].  Patient is sch to have a prostate Biopsy on 03/03/2017.  Patient needs to be off plavix X 7days prior.  [facility] needs a clearance for this procedure.  Spoke with patient and he is sch to come in on 02/08/2017 to be cleared for this procedure since he has not seen you since September of last year.  Placed request in your box until his appointment next week.

## 2017-02-08 ENCOUNTER — Encounter: Payer: Self-pay | Admitting: Internal Medicine

## 2017-02-08 ENCOUNTER — Ambulatory Visit (INDEPENDENT_AMBULATORY_CARE_PROVIDER_SITE_OTHER): Payer: Medicaid Other | Admitting: Internal Medicine

## 2017-02-08 VITALS — BP 136/72 | HR 84 | Temp 97.9°F | Ht 69.0 in | Wt 235.5 lb

## 2017-02-08 DIAGNOSIS — Z7902 Long term (current) use of antithrombotics/antiplatelets: Secondary | ICD-10-CM | POA: Diagnosis not present

## 2017-02-08 DIAGNOSIS — E1165 Type 2 diabetes mellitus with hyperglycemia: Principal | ICD-10-CM

## 2017-02-08 DIAGNOSIS — Z01810 Encounter for preprocedural cardiovascular examination: Secondary | ICD-10-CM | POA: Diagnosis not present

## 2017-02-08 DIAGNOSIS — Z01818 Encounter for other preprocedural examination: Secondary | ICD-10-CM

## 2017-02-08 DIAGNOSIS — J3489 Other specified disorders of nose and nasal sinuses: Secondary | ICD-10-CM | POA: Diagnosis not present

## 2017-02-08 DIAGNOSIS — I120 Hypertensive chronic kidney disease with stage 5 chronic kidney disease or end stage renal disease: Secondary | ICD-10-CM | POA: Diagnosis not present

## 2017-02-08 DIAGNOSIS — E1151 Type 2 diabetes mellitus with diabetic peripheral angiopathy without gangrene: Secondary | ICD-10-CM | POA: Diagnosis not present

## 2017-02-08 DIAGNOSIS — E1122 Type 2 diabetes mellitus with diabetic chronic kidney disease: Secondary | ICD-10-CM

## 2017-02-08 DIAGNOSIS — N186 End stage renal disease: Secondary | ICD-10-CM

## 2017-02-08 DIAGNOSIS — Z992 Dependence on renal dialysis: Secondary | ICD-10-CM

## 2017-02-08 DIAGNOSIS — R972 Elevated prostate specific antigen [PSA]: Secondary | ICD-10-CM

## 2017-02-08 DIAGNOSIS — Z87891 Personal history of nicotine dependence: Secondary | ICD-10-CM | POA: Diagnosis not present

## 2017-02-08 DIAGNOSIS — J302 Other seasonal allergic rhinitis: Secondary | ICD-10-CM

## 2017-02-08 DIAGNOSIS — IMO0002 Reserved for concepts with insufficient information to code with codable children: Secondary | ICD-10-CM

## 2017-02-08 LAB — GLUCOSE, CAPILLARY: GLUCOSE-CAPILLARY: 103 mg/dL — AB (ref 65–99)

## 2017-02-08 LAB — POCT GLYCOSYLATED HEMOGLOBIN (HGB A1C): Hemoglobin A1C: 5.3

## 2017-02-08 NOTE — Progress Notes (Signed)
   CC: follow up of diabetes, cardiac evaluation for surgical risk   HPI: [name] is a 56 y.o. with past medical history ESRD, hypertension, peripheral vascular disease and diabetes who presents to clinic for follow up of diabetes and for pre op cardiac evaluation for prostate biopsy.   He is currently working with the [place] transplant team and was found to have a PSA 6 in 05/2016. He was referred to urology for evaluation about 1 month ago, they scheduled him for prostate biopsy and referred him for preop eval but then found repeat PSA 2.0. He is scheduled for repeat PSA on 3/22 but has come in today for preop evaluation in case he is in need of prostate biopsy. 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 He takes plavix for peripheral vascular disease. He denies symptoms of chest pain, shortness of breath, orthopnea, or leg swelling. He is able to perform all ADLs without assistance, he could walk through the entire grocery store without having difficulty breathing or chest pain. He is a former smoker, quit smoking 8 years ago with a 17 pack year history. He has no history of sleep apnea.   He reports symptoms of sinus congestion with mild sore throat and productive cough in the morning. He denies myalgia or fevers.    Please see problem list for status of the pt's chronic medical problems.  Past Medical History:  Diagnosis Date  . Diabetes mellitus without complication ([place])   . ESRD on dialysis ([place])   . Hypertension     Review of Systems:  Please see each problem below for a pertinent review of systems.  Physical Exam:  Vitals:   02/08/17 0853  BP: 136/72  Pulse: 84  Temp: 97.9 F (36.6 C)  TempSrc: Oral  SpO2: 100%  Weight: 235 lb 8 oz (106.8 kg)  Height: 5\' 9"  (1.753 m)   Physical Exam  Constitutional: He appears well-developed and  well-nourished. No distress.  HENT:  Head: Normocephalic and atraumatic.  No sinus tenderness  Eyes: Conjunctivae are normal. No scleral icterus.  Cardiovascular: Normal rate and regular rhythm.   No murmur heard. Pulmonary/Chest: Effort normal. No respiratory distress. He has no wheezes. He has no rales.  Lymphadenopathy:    He has no cervical adenopathy.  Skin: He is not diaphoretic.   Assessment & Plan:   See Encounters Tab for problem based charting.  Preoperative surgical evaluation for prostate biopsy RCRI score is 1 (crt >2) correlates with 1.0% risk of cardiac death, non fatal MI, and non fatal cardiac arrest . He is at low risk for cardiovascular complications with this low risk prostate biopsy surgery. I have informed him to have his urologist request this office note if his PSA remains elevated and he needs to be taken for biopsy.  Diabetes  A1C 5.3 today with CBG 103 after breakfast. He is maintaining good glycemic control with diet and activity.  - continue management with diet and exercise   Sinus congestion  Symptoms of sinus congestion may be related to seasonal allergies vs.viral URI.   -trial of claritin q48 hours (renal dosing in this ESRD pt)   Patient seen with Dr. Lynnae January

## 2017-02-08 NOTE — Patient Instructions (Addendum)
It was a pleasure to meet you today [name],   We will have all of the documentation ready for the urologist if they decide that you need the prostate biopsy, just let this office know.   Please schedule a follow up appointment to see your primary care doctor in April

## 2017-02-09 DIAGNOSIS — Z01818 Encounter for other preprocedural examination: Secondary | ICD-10-CM | POA: Insufficient documentation

## 2017-02-09 NOTE — Assessment & Plan Note (Signed)
A1C 5.3 today with CBG 103 after breakfast. He is maintaining good glycemic control with diet and activity.  - continue management with diet and exercise

## 2017-02-09 NOTE — Assessment & Plan Note (Addendum)
He is currently working with the [place] transplant team and was found to have a PSA 6 in 05/2016. He was referred to urology for evaluation about 1 month ago, they scheduled him for prostate biopsy and referred him for preop eval but then found repeat PSA 2.0. He is scheduled for repeat PSA on 3/22 but has come in today for preop evaluation in case he is in need of prostate biopsy. 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 He takes plavix for peripheral vascular disease. He denies symptoms of chest pain, shortness of breath, orthopnea, or leg swelling. He is able to perform all ADLs without assistance, he could walk through the entire grocery store without having difficulty breathing or chest pain. He is a former smoker, quit smoking 8 years ago with a 17 pack year history. He has no history of sleep apnea.   RCRI score is 1 (crt >2) correlates with 1.0% risk of cardiac death, non fatal MI, and non fatal cardiac arrest . He is at low risk for cardiovascular complications with this low risk prostate biopsy surgery. I have informed him to have his urologist request this office note if his PSA remains elevated and he needs to be taken for biopsy.

## 2017-02-09 NOTE — Assessment & Plan Note (Signed)
He reports symptoms of sinus congestion with mild sore throat and productive cough in the morning. He denies myalgia or fevers.    Symptoms of sinus congestion may be related to seasonal allergies vs.viral URI.   -trial of claritin q48 hours (renal dosing in this ESRD pt)

## 2017-02-09 NOTE — Addendum Note (Signed)
Addended by: Meryl Dare on: 02/09/2017 10:28 PM   Modules accepted: Level of Service

## 2017-02-10 NOTE — Progress Notes (Signed)
Internal Medicine Clinic Attending  Case discussed with Dr. Molt at the time of the visit.  We reviewed the resident's history and exam and pertinent patient test results.  I agree with the assessment, diagnosis, and plan of care documented in the resident's note. 

## 2017-02-11 NOTE — Telephone Encounter (Signed)
He is taking Plavix for his peripheral vascular disease. It is okay for him to be off of Plavix for 7 days prior to his planned prostate biopsy.

## 2017-02-11 NOTE — Telephone Encounter (Signed)
Second phone call from [facility] patient had his visit on 02/08/2017  with Dr. Hetty Ely for his Clearance. Notes were faxed but, their office is still in need of knowing if the patient needs to be off Plavix X 7 days prior to his Biopsy.  Please advise

## 2017-02-15 NOTE — Telephone Encounter (Signed)
I will send this message to [facility] along with the office visit for the patient.

## 2017-03-30 ENCOUNTER — Encounter (INDEPENDENT_AMBULATORY_CARE_PROVIDER_SITE_OTHER): Payer: Self-pay

## 2017-03-30 ENCOUNTER — Ambulatory Visit (INDEPENDENT_AMBULATORY_CARE_PROVIDER_SITE_OTHER): Payer: Medicaid Other | Admitting: Internal Medicine

## 2017-03-30 ENCOUNTER — Encounter: Payer: Self-pay | Admitting: Internal Medicine

## 2017-03-30 VITALS — BP 121/71 | HR 101 | Temp 97.6°F | Wt 235.0 lb

## 2017-03-30 DIAGNOSIS — N186 End stage renal disease: Secondary | ICD-10-CM | POA: Diagnosis not present

## 2017-03-30 DIAGNOSIS — I152 Hypertension secondary to endocrine disorders: Secondary | ICD-10-CM | POA: Diagnosis not present

## 2017-03-30 DIAGNOSIS — R972 Elevated prostate specific antigen [PSA]: Secondary | ICD-10-CM

## 2017-03-30 DIAGNOSIS — Z992 Dependence on renal dialysis: Secondary | ICD-10-CM

## 2017-03-30 DIAGNOSIS — Z9862 Peripheral vascular angioplasty status: Secondary | ICD-10-CM | POA: Diagnosis not present

## 2017-03-30 DIAGNOSIS — I739 Peripheral vascular disease, unspecified: Secondary | ICD-10-CM

## 2017-03-30 DIAGNOSIS — E1151 Type 2 diabetes mellitus with diabetic peripheral angiopathy without gangrene: Secondary | ICD-10-CM | POA: Diagnosis not present

## 2017-03-30 DIAGNOSIS — Z87891 Personal history of nicotine dependence: Secondary | ICD-10-CM | POA: Diagnosis not present

## 2017-03-30 DIAGNOSIS — Z7902 Long term (current) use of antithrombotics/antiplatelets: Secondary | ICD-10-CM | POA: Diagnosis not present

## 2017-03-30 DIAGNOSIS — Z0000 Encounter for general adult medical examination without abnormal findings: Secondary | ICD-10-CM

## 2017-03-30 DIAGNOSIS — E1159 Type 2 diabetes mellitus with other circulatory complications: Secondary | ICD-10-CM

## 2017-03-30 DIAGNOSIS — E1122 Type 2 diabetes mellitus with diabetic chronic kidney disease: Secondary | ICD-10-CM

## 2017-03-30 DIAGNOSIS — I10 Essential (primary) hypertension: Principal | ICD-10-CM

## 2017-03-30 MED ORDER — CINACALCET HCL 30 MG PO TABS: 30.0000 mg | ORAL_TABLET | ORAL | Status: DC

## 2017-03-30 NOTE — Progress Notes (Signed)
CC: T2DM  HPI:  Perry Thomas is a 56 y.o. male with PMH as listed below including ESRD on MWF HD, diet controlled T2DM, HTN, and PVD (s/p L SFA angioplasty and R SFA angioplasty) who presents for follow up management of his T2DM and HTN. Please see problem based charting for status of patient's chronic medical issues.  T2DM:  Last Hgb A1c was 5.3 on 02/08/17. He is diet-controlled, not on any medications for this. He was previously on oral medications which reportedly made him hypoglycemic. 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  HTN: BP 121/72. He is not on antihypertensive medications. Well controlled with diet. He is planning on increasing the amount of exercise and is interested in joining the [facility]. He plans to start walking to dialysis once the weather is warmer as his HD center is a short distance from his home.  ESRD on MWF HD: He recently switched from TTS to a MWF dialysis schedule as the [place] location is near his home. He has been tolerating dialysis fairly well with occasional cramping sensation in his legs during sessions. His access is at his left wrist. He has been seen at [facility] for pre-transplant evaluation.  Elevated PSA: Patient had a mildly elevated PSA of 6.08 which was checked as part of his pre-transplant evaluation. He was seen by Urology who performed a biopsy of the prostate earlier this month with final pathology results pending. Patient does continue to make some urine while on dialysis but denies any urinary symptoms. He held his Plavix 2 days post-op which he has now resumed.  PVD s/p L & R SFA angioplasty: Patient is s/p L SFA angioplasty and R SFA angioplasty in 2016. He has been taking Plavix daily for this which he has now resumed after holding for his prostate biopsy. He denies any claudication symptoms, only has intermittent leg cramping during dialysis. He is not on a statin.  Healthcare  Maintenance: Patient reports having a colonoscopy about 3 years ago when he was living in [place]. Records were requested, but we have not received them. He thinks he had the pneumonia vaccine at his dialysis center.    Past Medical History:  Diagnosis Date  . Diabetes mellitus without complication ([place])   . ESRD on dialysis ([place])   . Hypertension     Review of Systems:   Review of Systems  Constitutional: Negative for chills and fever.  Respiratory: Negative for shortness of breath.   Cardiovascular: Negative for chest pain, palpitations, claudication and leg swelling.  Gastrointestinal: Negative for abdominal pain, nausea and vomiting.  Genitourinary: Negative for dysuria.  Musculoskeletal: Negative for falls.  Neurological: Negative for dizziness and loss of consciousness.     Physical Exam:  Vitals:   03/30/17 1335  BP: 121/71  Pulse: (!) 101  Temp: 97.6 F (36.4 C)  TempSrc: Oral  SpO2: 97%  Weight: 235 lb (106.6 kg)   Physical Exam  Constitutional: He is oriented to person, place, and time. He appears well-developed and well-nourished. No distress.  HENT:  Head: Normocephalic and atraumatic.  Eyes:  Muddy conjuctiva  Cardiovascular: Normal rate and regular rhythm.   AVF Left wrist with palpable thrill  Pulmonary/Chest: Effort normal. No respiratory distress. He has no wheezes. He has no rales.  Musculoskeletal: He exhibits no edema or tenderness.  Neurological: He is alert and oriented to person, place, and time.  Skin: Skin is warm. He is not  diaphoretic.  Psychiatric: He has a normal mood and affect.     Assessment & Plan:   See Encounters Tab for problem based charting.  Patient discussed with Dr. Lynnae January  Type 2 diabetes mellitus ([place]) His diabetes is well controlled. His A1c may be falsely lower than his actual value secondary to his dialysis, however I would not expect his true A1c to be above 7%. Will continue with current management off  glucose lowering therapy and continue to emphasize healthy eating patterns and exercise.  Hypertension associated with diabetes ([place]) BP Readings from Last 3 Encounters:  03/30/17 121/71  02/08/17 136/72  07/14/16 125/72   His BP is well-controlled off antihypertensives. Will continue to encourage healthy eating patterns and exercise.  ESRD (end stage renal disease) on dialysis [facility]) Patient tolerating HD well. He says all of his pre-transplant workup has been completing pending his prostate biopsy results. I encouraged him to follow up with the [place] transplant team to ensure his workup and candidacy for transplant are on schedule. He will continue HD MWF per Nephrology.  Elevated PSA, less than 10 ng/ml S/p prostate biopsy by Urology pending final path report. He will send a copy of his pathology report to [facility] and our clinic. He will follow up with Urology later this year.  Peripheral vascular disease ([place]) His PVD is well controlled at this time without claudication symptoms. He will continue his Plavix 75 mg daily. Will need to discuss adding on statin therapy on follow up.  Healthcare maintenance We will try to retrieve his colonoscopy report and verify whether or not he received the pneumonia vaccine.

## 2017-03-30 NOTE — Patient Instructions (Signed)
It was a pleasure to see you again [name].  You are doing a great job taking care of your diabetes and blood pressure.  I think getting into an exercise program at the [facility] is a great idea.  We will again try to get your colonoscopy records.  Please follow up with me in 6 months or sooner if needed.

## 2017-03-31 DIAGNOSIS — R972 Elevated prostate specific antigen [PSA]: Secondary | ICD-10-CM | POA: Insufficient documentation

## 2017-03-31 HISTORY — DX: Elevated prostate specific antigen (PSA): R97.20

## 2017-03-31 NOTE — Assessment & Plan Note (Signed)
His diabetes is well controlled. His A1c may be falsely lower than his actual value secondary to his dialysis, however I would not expect his true A1c to be above 7%. Will continue with current management off glucose lowering therapy and continue to emphasize healthy eating patterns and exercise.

## 2017-03-31 NOTE — Assessment & Plan Note (Signed)
S/p prostate biopsy by Urology pending final path report. He will send a copy of his pathology report to [facility] and our clinic. He will follow up with Urology later this year.

## 2017-03-31 NOTE — Assessment & Plan Note (Signed)
Patient tolerating HD well. He says all of his pre-transplant workup has been completing pending his prostate biopsy results. I encouraged him to follow up with the [place] transplant team to ensure his workup and candidacy for transplant are on schedule. He will continue HD MWF per Nephrology.

## 2017-03-31 NOTE — Assessment & Plan Note (Signed)
His PVD is well controlled at this time without claudication symptoms. He will continue his Plavix 75 mg daily. Will need to discuss adding on statin therapy on follow up.

## 2017-03-31 NOTE — Assessment & Plan Note (Signed)
We will try to retrieve his colonoscopy report and verify whether or not he received the pneumonia vaccine.

## 2017-03-31 NOTE — Assessment & Plan Note (Signed)
BP Readings from Last 3 Encounters:  03/30/17 121/71  02/08/17 136/72  07/14/16 125/72   His BP is well-controlled off antihypertensives. Will continue to encourage healthy eating patterns and exercise.

## 2017-03-31 NOTE — Progress Notes (Signed)
Internal Medicine Clinic Attending  Case discussed with Dr. Sherrye Payor at the time of the visit.  We reviewed the resident's history and exam and pertinent patient test results.  I agree with the assessment, diagnosis, and plan of care documented in the resident's note.

## 2017-04-19 ENCOUNTER — Encounter: Payer: Self-pay | Admitting: Podiatry

## 2017-04-19 ENCOUNTER — Ambulatory Visit (INDEPENDENT_AMBULATORY_CARE_PROVIDER_SITE_OTHER): Payer: Medicaid Other | Admitting: Podiatry

## 2017-04-19 DIAGNOSIS — B351 Tinea unguium: Secondary | ICD-10-CM

## 2017-04-19 DIAGNOSIS — M79676 Pain in unspecified toe(s): Secondary | ICD-10-CM | POA: Diagnosis not present

## 2017-04-19 DIAGNOSIS — E119 Type 2 diabetes mellitus without complications: Secondary | ICD-10-CM

## 2017-04-19 NOTE — Progress Notes (Signed)
Patient ID: Perry Thomas, male   DOB: [birth year], 56 y.o.   MRN: [id number] Complaint:  Visit Type: Patient returns to my office for continued preventative foot care services. Complaint: Patient states" my nails have grown long and thick and become painful to walk and wear shoes" Patient has been diagnosed with DM with no foot complications. Diet controlled. The patient presents for preventative foot care services. No changes to ROS  Podiatric Exam: Vascular: dorsalis pedis and posterior tibial pulses are palpable bilateral. Capillary return is immediate. Temperature gradient is WNL. Skin turgor WNL  Sensorium: Normal Semmes Weinstein monofilament test. Normal tactile sensation bilaterally. Nail Exam: Pt has thick disfigured discolored nails with subungual debris noted bilateral entire nail hallux through fifth toenails Ulcer Exam: There is no evidence of ulcer or pre-ulcerative changes or infection. Orthopedic Exam: Muscle tone and strength are WNL. No limitations in general ROM. No crepitus or effusions noted. Foot type and digits show no abnormalities. Bony prominences are unremarkable. Skin: No Porokeratosis. No infection or ulcers  Diagnosis:  Onychomycosis, , Pain in right toe, pain in left toes  Treatment & Plan Procedures and Treatment: Consent by patient was obtained for treatment procedures. The patient understood the discussion of treatment and procedures well. All questions were answered thoroughly reviewed. Debridement of mycotic and hypertrophic toenails, 1 through 5 bilateral and clearing of subungual debris. No ulceration, no infection noted. Return Visit-Office Procedure: Patient instructed to return to the office for a follow up visit 3 months for continued evaluation and treatment.    Gardiner Barefoot DPM

## 2017-04-21 ENCOUNTER — Encounter: Payer: Self-pay | Admitting: *Deleted

## 2017-06-01 ENCOUNTER — Other Ambulatory Visit: Payer: Self-pay | Admitting: Internal Medicine

## 2017-06-01 DIAGNOSIS — I739 Peripheral vascular disease, unspecified: Secondary | ICD-10-CM

## 2017-07-19 ENCOUNTER — Ambulatory Visit: Payer: Medicaid Other | Admitting: Podiatry

## 2017-08-23 ENCOUNTER — Encounter: Payer: Self-pay | Admitting: Podiatry

## 2017-08-23 ENCOUNTER — Ambulatory Visit (INDEPENDENT_AMBULATORY_CARE_PROVIDER_SITE_OTHER): Payer: Medicaid Other | Admitting: Podiatry

## 2017-08-23 DIAGNOSIS — E119 Type 2 diabetes mellitus without complications: Secondary | ICD-10-CM

## 2017-08-23 DIAGNOSIS — M79676 Pain in unspecified toe(s): Secondary | ICD-10-CM | POA: Diagnosis not present

## 2017-08-23 DIAGNOSIS — B351 Tinea unguium: Secondary | ICD-10-CM | POA: Diagnosis not present

## 2017-08-23 NOTE — Progress Notes (Signed)
Patient ID: Perry Thomas, male   DOB: 1961-06-09, 56 y.o.   MRN: [id number] Complaint:  Visit Type: Patient returns to my office for continued preventative foot care services. Complaint: Patient states" my nails have grown long and thick and become painful to walk and wear shoes" Patient has been diagnosed with DM with no foot complications. Diet controlled. The patient presents for preventative foot care services. No changes to ROS  Podiatric Exam: Vascular: dorsalis pedis and posterior tibial pulses are palpable bilateral. Capillary return is immediate. Temperature gradient is WNL. Skin turgor WNL  Sensorium: Normal Semmes Weinstein monofilament test. Normal tactile sensation bilaterally. Nail Exam: Pt has thick disfigured discolored nails with subungual debris noted bilateral entire nail hallux through fifth toenails Ulcer Exam: There is no evidence of ulcer or pre-ulcerative changes or infection. Orthopedic Exam: Muscle tone and strength are WNL. No limitations in general ROM. No crepitus or effusions noted. Foot type and digits show no abnormalities. Bony prominences are unremarkable. Skin: No Porokeratosis. No infection or ulcers  Diagnosis:  Onychomycosis, , Pain in right toe, pain in left toes  Treatment & Plan Procedures and Treatment: Consent by patient was obtained for treatment procedures. The patient understood the discussion of treatment and procedures well. All questions were answered thoroughly reviewed. Debridement of mycotic and hypertrophic toenails, 1 through 5 bilateral and clearing of subungual debris. No ulceration, no infection noted. Return Visit-Office Procedure: Patient instructed to return to the office for a follow up visit 4 months for continued evaluation and treatment.    Gardiner Barefoot DPM

## 2017-09-21 ENCOUNTER — Encounter: Payer: Self-pay | Admitting: Internal Medicine

## 2017-09-21 ENCOUNTER — Ambulatory Visit (INDEPENDENT_AMBULATORY_CARE_PROVIDER_SITE_OTHER): Payer: Medicaid Other | Admitting: Internal Medicine

## 2017-09-21 VITALS — BP 97/59 | HR 110 | Temp 98.2°F | Ht 69.0 in | Wt 238.0 lb

## 2017-09-21 DIAGNOSIS — I10 Essential (primary) hypertension: Secondary | ICD-10-CM

## 2017-09-21 DIAGNOSIS — I120 Hypertensive chronic kidney disease with stage 5 chronic kidney disease or end stage renal disease: Secondary | ICD-10-CM | POA: Diagnosis not present

## 2017-09-21 DIAGNOSIS — N186 End stage renal disease: Secondary | ICD-10-CM

## 2017-09-21 DIAGNOSIS — E1122 Type 2 diabetes mellitus with diabetic chronic kidney disease: Secondary | ICD-10-CM | POA: Diagnosis not present

## 2017-09-21 DIAGNOSIS — I152 Hypertension secondary to endocrine disorders: Secondary | ICD-10-CM

## 2017-09-21 DIAGNOSIS — Z992 Dependence on renal dialysis: Secondary | ICD-10-CM

## 2017-09-21 DIAGNOSIS — E1159 Type 2 diabetes mellitus with other circulatory complications: Secondary | ICD-10-CM | POA: Diagnosis not present

## 2017-09-21 DIAGNOSIS — Z0000 Encounter for general adult medical examination without abnormal findings: Secondary | ICD-10-CM

## 2017-09-21 LAB — GLUCOSE, CAPILLARY: GLUCOSE-CAPILLARY: 198 mg/dL — AB (ref 65–99)

## 2017-09-21 LAB — POCT GLYCOSYLATED HEMOGLOBIN (HGB A1C): Hemoglobin A1C: 5.6

## 2017-09-21 MED ORDER — CINACALCET HCL 30 MG PO TABS: 30.0000 mg | ORAL_TABLET | Freq: Every day | ORAL | Status: DC

## 2017-09-21 NOTE — Progress Notes (Signed)
CC: Diabetes  HPI:  PerryPerry Thomas is a 56 y.o.  male with PMH as listed below including ESRD on MWF HD, diet controlled T2DM, HTN, and PVD (s/p L SFA angioplasty and R SFA angioplasty) who presents for follow up management of his T2DM and HTN. Please see problem based charting for status of patient's chronic medical issues.  T2DM:  Last Hgb A1c was 5.3 on 02/08/17. He is not on medications for his diabetes as he is currently diet controlled. He says his appetite has increased recently and he wants to start exercising to help lose weight. 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  HTN: He is not on antihypertensive medications. Well controlled with diet. His BP this visit is 97/59. He did go to dialysis prior to coming to clinic. He denies any lightheadedness, dizziness, near-syncope, or LOC.   ESRD on MWF HD: He reports tolerating dialysis very well. His access is at his left wrist. He has been seen at [facility] for pre-transplant evaluation but has recently transferred his care to [place] Maintenance: Patient reports receiving his routine immunizations at dialysis.  Past Medical History:  Diagnosis Date  . Diabetes mellitus without complication ([place])   . ESRD on dialysis ([place])   . Hypertension    Review of Systems:   Review of Systems  Constitutional: Negative for chills, diaphoresis, fever and weight loss.  Respiratory: Negative for cough, shortness of breath and wheezing.   Cardiovascular: Negative for chest pain, palpitations and leg swelling.  Gastrointestinal: Negative for abdominal pain, nausea and vomiting.  Musculoskeletal: Negative for falls.  Neurological: Negative for dizziness and loss of consciousness.     Physical Exam:  Vitals:   09/21/17 1349  BP: (!) 97/59  Pulse: (!) 110  Temp: 98.2 F (36.8 C)  TempSrc: Oral  SpO2: 96%  Weight: 238 lb (108 kg)  Height: 5\' 9"  (1.753 m)   Physical  Exam  Constitutional: He is oriented to person, place, and time. He appears well-developed and well-nourished. No distress.  HENT:  Head: Normocephalic and atraumatic.  Cardiovascular: Normal rate and regular rhythm.   No murmur heard. AVF left wrist with palpable thrill  Pulmonary/Chest: Effort normal. No respiratory distress. He has no wheezes. He has no rales.  Abdominal: Soft. There is no tenderness.  Musculoskeletal: Normal range of motion. He exhibits no edema or tenderness.  Neurological: He is alert and oriented to person, place, and time.  Skin: Skin is warm. He is not diaphoretic.  Psychiatric: He has a normal mood and affect.    Assessment & Plan:   See Encounters Tab for problem based charting.  Patient discussed with Dr. Evette Doffing  Type 2 diabetes mellitus ([place]) Repeat A1c is 5.6. He remains well controlled. Encouraged continued dietary modifications and increasing exercise activity as tolerated.  Hypertension associated with diabetes ([place]) BP Readings from Last 3 Encounters:  09/21/17 (!) 97/59  03/30/17 121/71  02/08/17 136/72   BP is a little low just after dialysis, however he is asymptomatic. He is not on any antihypertensive medications. Will continue current management with lifestyle modifications including healthy eating patterns and increasing activity as tolerated.  ESRD (end stage renal disease) on dialysis [facility]) Patient reports adherence and tolerance of dialysis very well. He is now being evaluated at [facility] for possible renal transplant candidacy. He will continue HD MWF per nephrology.  Healthcare maintenance We will try to obtain his immunization records  from dialysis.

## 2017-09-21 NOTE — Patient Instructions (Addendum)
It was a pleasure to see you again [name].  Your A1c was 5.6 today, keep up the great work!  Please continue your current medications and continue to work on diet and exercise.  Please follow up with me in 6 months or sooner if needed.

## 2017-09-22 NOTE — Assessment & Plan Note (Signed)
We will try to obtain his immunization records from dialysis.

## 2017-09-22 NOTE — Assessment & Plan Note (Signed)
Repeat A1c is 5.6. He remains well controlled. Encouraged continued dietary modifications and increasing exercise activity as tolerated.

## 2017-09-22 NOTE — Assessment & Plan Note (Signed)
Patient reports adherence and tolerance of dialysis very well. He is now being evaluated at [facility] for possible renal transplant candidacy. He will continue HD MWF per nephrology.

## 2017-09-22 NOTE — Assessment & Plan Note (Signed)
BP Readings from Last 3 Encounters:  09/21/17 (!) 97/59  03/30/17 121/71  02/08/17 136/72   BP is a little low just after dialysis, however he is asymptomatic. He is not on any antihypertensive medications. Will continue current management with lifestyle modifications including healthy eating patterns and increasing activity as tolerated.

## 2017-09-23 NOTE — Progress Notes (Signed)
Internal Medicine Clinic Attending  Case discussed with Dr. Patel at the time of the visit.  We reviewed the resident's history and exam and pertinent patient test results.  I agree with the assessment, diagnosis, and plan of care documented in the resident's note.  

## 2017-09-28 ENCOUNTER — Encounter: Payer: Medicaid Other | Admitting: Internal Medicine

## 2017-10-06 ENCOUNTER — Encounter: Payer: Self-pay | Admitting: *Deleted

## 2017-10-06 ENCOUNTER — Encounter: Payer: Self-pay | Admitting: Neurology

## 2017-12-27 ENCOUNTER — Ambulatory Visit: Payer: Medicaid Other | Admitting: Podiatry

## 2017-12-27 ENCOUNTER — Encounter: Payer: Self-pay | Admitting: Podiatry

## 2017-12-27 DIAGNOSIS — B351 Tinea unguium: Secondary | ICD-10-CM

## 2017-12-27 DIAGNOSIS — M79676 Pain in unspecified toe(s): Secondary | ICD-10-CM | POA: Diagnosis not present

## 2017-12-27 DIAGNOSIS — E119 Type 2 diabetes mellitus without complications: Secondary | ICD-10-CM

## 2017-12-27 NOTE — Progress Notes (Signed)
Patient ID: Perry Thomas, male   DOB: [birth year], 56 y.o.   MRN: [id number] Complaint:  Visit Type: Patient returns to my office for continued preventative foot care services. Complaint: Patient states" my nails have grown long and thick and become painful to walk and wear shoes" Patient has been diagnosed with DM with no foot complications. Diet controlled. The patient presents for preventative foot care services. No changes to ROS.  Patient is taking plavix.  Podiatric Exam: Vascular: dorsalis pedis and posterior tibial pulses are palpable bilateral. Capillary return is immediate. Temperature gradient is WNL. Skin turgor WNL  Sensorium: Normal Semmes Weinstein monofilament test. Normal tactile sensation bilaterally. Nail Exam: Pt has thick disfigured discolored nails with subungual debris noted bilateral entire nail hallux through fifth toenails Ulcer Exam: There is no evidence of ulcer or pre-ulcerative changes or infection. Orthopedic Exam: Muscle tone and strength are WNL. No limitations in general ROM. No crepitus or effusions noted. Foot type and digits show no abnormalities. Bony prominences are unremarkable. Skin: No Porokeratosis. No infection or ulcers  Diagnosis:  Onychomycosis, , Pain in right toe, pain in left toes  Treatment & Plan Procedures and Treatment: Consent by patient was obtained for treatment procedures. The patient understood the discussion of treatment and procedures well. All questions were answered thoroughly reviewed. Debridement of mycotic and hypertrophic toenails, 1 through 5 bilateral and clearing of subungual debris. No ulceration, no infection noted. Return Visit-Office Procedure: Patient instructed to return to the office for a follow up visit 4 months for continued evaluation and treatment.    Gardiner Barefoot DPM

## 2018-01-05 ENCOUNTER — Encounter: Payer: Self-pay | Admitting: *Deleted

## 2018-01-05 LAB — HM DIABETES EYE EXAM

## 2018-01-16 ENCOUNTER — Ambulatory Visit: Payer: Medicaid Other | Admitting: Neurology

## 2018-02-16 ENCOUNTER — Telehealth: Payer: Self-pay | Admitting: Internal Medicine

## 2018-02-16 NOTE — Telephone Encounter (Signed)
Spoke w/ pt, he states he wants his blood type for possible transplant donors, explained that it is not available, ask him to speak w/ nephrologist, he will see him tomorrow for appt and will do so then

## 2018-02-16 NOTE — Telephone Encounter (Signed)
Patient is calling to check his blood type

## 2018-03-07 LAB — HM DIABETES EYE EXAM

## 2018-03-09 ENCOUNTER — Encounter: Payer: Self-pay | Admitting: *Deleted

## 2018-03-22 ENCOUNTER — Encounter: Payer: Medicaid Other | Admitting: Internal Medicine

## 2018-04-25 ENCOUNTER — Ambulatory Visit: Payer: Medicaid Other | Admitting: Podiatry

## 2018-04-25 ENCOUNTER — Encounter: Payer: Self-pay | Admitting: Podiatry

## 2018-04-25 DIAGNOSIS — E119 Type 2 diabetes mellitus without complications: Secondary | ICD-10-CM

## 2018-04-25 DIAGNOSIS — B351 Tinea unguium: Secondary | ICD-10-CM

## 2018-04-25 DIAGNOSIS — M79676 Pain in unspecified toe(s): Secondary | ICD-10-CM

## 2018-04-25 DIAGNOSIS — D689 Coagulation defect, unspecified: Secondary | ICD-10-CM

## 2018-04-25 DIAGNOSIS — Q828 Other specified congenital malformations of skin: Secondary | ICD-10-CM

## 2018-04-25 NOTE — Progress Notes (Signed)
Patient ID: Perry Thomas, male   DOB: June 01, 1961, 57 y.o.   MRN: [id number] Complaint:  Visit Type: Patient returns to my office for continued preventative foot care services. Complaint: Patient states" my nails have grown long and thick and become painful to walk and wear shoes" Patient has been diagnosed with DM with no foot complications. Diet controlled. The patient presents for preventative foot care services. No changes to ROS.  Patient is taking plavix.  Podiatric Exam: Vascular: dorsalis pedis and posterior tibial pulses are palpable bilateral. Capillary return is immediate. Temperature gradient is WNL. Skin turgor WNL  Sensorium: Normal Semmes Weinstein monofilament test. Normal tactile sensation bilaterally. Nail Exam: Pt has thick disfigured discolored nails with subungual debris noted bilateral entire nail hallux through fifth toenails Ulcer Exam: There is no evidence of ulcer or pre-ulcerative changes or infection. Orthopedic Exam: Muscle tone and strength are WNL. No limitations in general ROM. No crepitus or effusions noted. Foot type and digits show no abnormalities. Bony prominences are unremarkable. Skin:  Porokeratosis sub 5 left.. No infection or ulcers  Diagnosis:  Onychomycosis, , Pain in right toe, pain in left toes  Treatment & Plan Procedures and Treatment: Consent by patient was obtained for treatment procedures. The patient understood the discussion of treatment and procedures well. All questions were answered thoroughly reviewed. Debridement of mycotic and hypertrophic toenails, 1 through 5 bilateral and clearing of subungual debris. No ulceration, no infection noted. Return Visit-Office Procedure: Patient instructed to return to the office for a follow up visit 3 months for continued evaluation and treatment.    Gardiner Barefoot DPM

## 2018-05-03 ENCOUNTER — Ambulatory Visit (INDEPENDENT_AMBULATORY_CARE_PROVIDER_SITE_OTHER): Payer: Medicaid Other | Admitting: Internal Medicine

## 2018-05-03 ENCOUNTER — Encounter: Payer: Self-pay | Admitting: Internal Medicine

## 2018-05-03 ENCOUNTER — Other Ambulatory Visit: Payer: Self-pay

## 2018-05-03 VITALS — BP 105/72 | HR 87 | Temp 97.4°F | Ht 69.0 in | Wt 239.7 lb

## 2018-05-03 DIAGNOSIS — Z9862 Peripheral vascular angioplasty status: Secondary | ICD-10-CM

## 2018-05-03 DIAGNOSIS — Z7902 Long term (current) use of antithrombotics/antiplatelets: Secondary | ICD-10-CM

## 2018-05-03 DIAGNOSIS — E1151 Type 2 diabetes mellitus with diabetic peripheral angiopathy without gangrene: Secondary | ICD-10-CM | POA: Diagnosis not present

## 2018-05-03 DIAGNOSIS — R5383 Other fatigue: Secondary | ICD-10-CM | POA: Diagnosis not present

## 2018-05-03 DIAGNOSIS — I10 Essential (primary) hypertension: Secondary | ICD-10-CM

## 2018-05-03 DIAGNOSIS — N529 Male erectile dysfunction, unspecified: Secondary | ICD-10-CM | POA: Diagnosis not present

## 2018-05-03 DIAGNOSIS — I120 Hypertensive chronic kidney disease with stage 5 chronic kidney disease or end stage renal disease: Secondary | ICD-10-CM

## 2018-05-03 DIAGNOSIS — I152 Hypertension secondary to endocrine disorders: Secondary | ICD-10-CM

## 2018-05-03 DIAGNOSIS — Z79899 Other long term (current) drug therapy: Secondary | ICD-10-CM

## 2018-05-03 DIAGNOSIS — E1122 Type 2 diabetes mellitus with diabetic chronic kidney disease: Secondary | ICD-10-CM | POA: Diagnosis not present

## 2018-05-03 DIAGNOSIS — E1159 Type 2 diabetes mellitus with other circulatory complications: Secondary | ICD-10-CM

## 2018-05-03 DIAGNOSIS — Z992 Dependence on renal dialysis: Secondary | ICD-10-CM | POA: Diagnosis not present

## 2018-05-03 DIAGNOSIS — I739 Peripheral vascular disease, unspecified: Secondary | ICD-10-CM

## 2018-05-03 DIAGNOSIS — N186 End stage renal disease: Secondary | ICD-10-CM | POA: Diagnosis not present

## 2018-05-03 LAB — POCT GLYCOSYLATED HEMOGLOBIN (HGB A1C): HEMOGLOBIN A1C: 5.7 % — AB (ref 4.0–5.6)

## 2018-05-03 LAB — GLUCOSE, CAPILLARY: Glucose-Capillary: 194 mg/dL — ABNORMAL HIGH (ref 65–99)

## 2018-05-03 MED ORDER — ROSUVASTATIN CALCIUM 20 MG PO TABS: 20.0000 mg | ORAL_TABLET | Freq: Every day | ORAL | 3 refills | Status: DC

## 2018-05-03 NOTE — Patient Instructions (Addendum)
It was a pleasure to see you again [name].  We will start Rosuvastatin (Crestor) for your circulation and reduce risk of heart attack or stroke over the next 10 years.  We are checking blood work for possible cause of your low energy.  Please continue exercise as tolerated.  Please follow up with [place] in 6 months or sooner if needed.  Rosuvastatin Tablets What is this medicine? ROSUVASTATIN (roe SOO va sta tin) is known as a HMG-CoA reductase inhibitor or 'statin'. It lowers cholesterol and triglycerides in the blood. This drug may also reduce the risk of heart attack, stroke, or other health problems in patients with risk factors for heart disease. Diet and lifestyle changes are often used with this drug. This medicine may be used for other purposes; ask your health care provider or pharmacist if you have questions. COMMON BRAND NAME(S): Crestor What should I tell my health care provider before I take this medicine? They need to know if you have any of these conditions: -frequently drink alcoholic beverages -kidney disease -liver disease -muscle aches or weakness -other medical condition -an unusual or allergic reaction to rosuvastatin, other medicines, foods, dyes, or preservatives -pregnant or trying to get pregnant -breast-feeding How should I use this medicine? Take this medicine by mouth with a glass of water. Follow the directions on the prescription label. Do not cut, crush or chew this medicine. You can take this medicine with or without food. Take your doses at regular intervals. Do not take your medicine more often than directed. Talk to your pediatrician regarding the use of this medicine in children. While this drug may be prescribed for children as young as 4 years old for selected conditions, precautions do apply. Overdosage: If you think you have taken too much of this medicine contact a poison control center or emergency room at once. NOTE: This medicine is only for  you. Do not share this medicine with others. What if I miss a dose? If you miss a dose, take it as soon as you can. Do not take 2 doses within 12 hours of each other. If there are less than 12 hours until your next dose, take only that dose. Do not take double or extra doses. What may interact with this medicine? Do not take this medicine with any of the following medications: -herbal medicines like red yeast rice This medicine may also interact with the following medications: -alcohol -antacids containing aluminum hydroxide or magnesium hydroxide -cyclosporine -other medicines for high cholesterol -some medicines for HIV infection -warfarin This list may not describe all possible interactions. Give your health care provider a list of all the medicines, herbs, non-prescription drugs, or dietary supplements you use. Also tell them if you smoke, drink alcohol, or use illegal drugs. Some items may interact with your medicine. What should I watch for while using this medicine? Visit your doctor or health care professional for regular check-ups. You may need regular tests to make sure your liver is working properly. Tell your doctor or health care professional right away if you get any unexplained muscle pain, tenderness, or weakness, especially if you also have a fever and tiredness. Your doctor or health care professional may tell you to stop taking this medicine if you develop muscle problems. If your muscle problems do not go away after stopping this medicine, contact your health care professional. This medicine may affect blood sugar levels. If you have diabetes, check with your doctor or health care professional before you change  your diet or the dose of your diabetic medicine. Avoid taking antacids containing aluminum, calcium or magnesium within 2 hours of taking this medicine. This drug is only part of a total heart-health program. Your doctor or a dietician can suggest a low-cholesterol and  low-fat diet to help. Avoid alcohol and smoking, and keep a proper exercise schedule. Do not use this drug if you are pregnant or breast-feeding. Serious side effects to an unborn child or to an infant are possible. Talk to your doctor or pharmacist for more information. What side effects may I notice from receiving this medicine? Side effects that you should report to your doctor or health care professional as soon as possible: -allergic reactions like skin rash, itching or hives, swelling of the face, lips, or tongue -dark urine -fever -joint pain -muscle cramps, pain -redness, blistering, peeling or loosening of the skin, including inside the mouth -trouble passing urine or change in the amount of urine -unusually weak or tired -yellowing of the eyes or skin Side effects that usually do not require medical attention (report to your doctor or health care professional if they continue or are bothersome): -constipation -heartburn -nausea -stomach gas, pain, upset This list may not describe all possible side effects. Call your doctor for medical advice about side effects. You may report side effects to FDA at 1-800-FDA-1088. Where should I keep my medicine? Keep out of the reach of children. Store at room temperature between 20 and 25 degrees C (68 and 77 degrees F). Keep container tightly closed (protect from moisture). Throw away any unused medicine after the expiration date. NOTE: This sheet is a summary. It may not cover all possible information. If you have questions about this medicine, talk to your doctor, pharmacist, or health care provider.  2018 Elsevier/Gold Standard (2015-05-15 13:33:08)

## 2018-05-03 NOTE — Progress Notes (Signed)
CC: Diabetes  HPI:  Perry Thomas is a 57 y.o. with past medical history as listed below including ESRD on MWF HD, diet-controlled type 2 diabetes, hypertension, and PVD (status post left SFA angioplasty and right SFA angioplasty) who presents for follow-up management of his diabetes and hypertension.  Please see problem based charting for status patient's chronic medical issues.  Type 2 diabetes mellitus (HCC) Last A1c was 5.6.  He is not on any medications for his diabetes as it is diet controlled. A/P: Repeat A1c is 5.7 his visit remains well controlled.  He will continue with dietary modifications and exercise as tolerated.  Repeat A1c in 6 months.  Hypertension associated with diabetes [facility]) He has a history of hypertension that is now well controlled off of antihypertensive medications since starting dialysis. A/P: BP Readings from Last 3 Encounters:  05/03/18 105/72  09/21/17 (!) 97/59  03/30/17 121/71  Blood pressure is well controlled.  He denies any hypotension with dialysis.  We will continue to monitor.  ESRD (end stage renal disease) on dialysis [facility]) He is currently attending dialysis on Monday Wednesday Friday and reports tolerance.  He is being evaluated at [facility] for transplant and says that he is now on the renal transplant list. A/P: Currently stable.  Continue dialysis per nephrology.  Continue to follow with [facility] for potential transplant.  Peripheral vascular disease ([place]) He is currently taking Plavix 75 mg daily for his history of peripheral vascular disease status post left SFA angioplasty in 07/2015 and right SFA angioplasty 04/2015.  He denies any claudication. A/P: Currently stable.  We will continue Plavix 75 mg daily and start rosuvastatin 20 mg daily.   Low energy He reports feeling like he has low energy for the last few months.  He has begun exercising about 1 month ago and notices that he becomes fatigued after about 2 minutes of  activity which includes stationary biking, treadmill.  He denies any obvious bleeding.  He has no personal history of thyroid dysfunction.  He denies any depression. A/P: He has recently developed low energy which is more pronounced than starting an exercise routine.  This may be due to conditioning.  Lab work this visit does not show any anemia and TSH is within normal limits.  We will continue to monitor.  Can consider checking an a.m. testosterone level if this persists given his report of erectile dysfunction.  Erectile dysfunction Patient reports about a 2-year history of erectile dysfunction.  He reports having a normal libido but is unable to obtain or maintain an erection.  He says he does not experience morning erections.  He denies any symptoms of depression. A/P: Patient with reported erectile dysfunction.  He does have risk factors including diabetes and hypertension which are well controlled.  He also has history of peripheral vascular disease which can be contributing.  Of note he has had a prostate biopsy in April 2018 however he says his symptoms were occurring prior to that procedure.  His blood pressures have been borderline low, so we will withhold any medications at this time such as sildenafil.  He does state that he does not want to be on any medications for this issue at this time.  I recommended he continue to exercise as tolerated and we will add a high intensity statin given his history of peripheral vascular disease.   Past Medical History:  Diagnosis Date  . Diabetes mellitus without complication ([place])   . ESRD on dialysis ([place])   .  Hypertension    Review of Systems:   Review of Systems  Constitutional: Positive for malaise/fatigue. Negative for diaphoresis and fever.  Respiratory: Negative for shortness of breath.   Cardiovascular: Negative for chest pain and leg swelling.  Neurological: Negative for weakness.  Psychiatric/Behavioral: Negative for depression.      Physical Exam:  Vitals:   05/03/18 1409  BP: 105/72  Pulse: 87  Temp: (!) 97.4 F (36.3 C)  TempSrc: Oral  SpO2: 100%  Weight: 239 lb 11.2 oz (108.7 kg)  Height: 5\' 9"  (1.753 m)   Physical Exam  Constitutional: He is oriented to person, place, and time. He appears well-developed and well-nourished. No distress.  HENT:  Head: Normocephalic and atraumatic.  Cardiovascular: Normal rate and regular rhythm.  AVF L forearm with palpable thrill  Musculoskeletal: He exhibits no edema.  Neurological: He is alert and oriented to person, place, and time.  Skin: He is not diaphoretic.     Assessment & Plan:   See Encounters Tab for problem based charting.  Patient discussed with Dr. Dareen Piano

## 2018-05-04 LAB — CBC
HEMOGLOBIN: 13.8 g/dL (ref 13.0–17.7)
Hematocrit: 44 % (ref 37.5–51.0)
MCH: 31 pg (ref 26.6–33.0)
MCHC: 31.4 g/dL — AB (ref 31.5–35.7)
MCV: 99 fL — ABNORMAL HIGH (ref 79–97)
Platelets: 314 10*3/uL (ref 150–450)
RBC: 4.45 x10E6/uL (ref 4.14–5.80)
RDW: 13.6 % (ref 12.3–15.4)
WBC: 6.4 10*3/uL (ref 3.4–10.8)

## 2018-05-04 LAB — TSH: TSH: 0.962 u[IU]/mL (ref 0.450–4.500)

## 2018-05-04 NOTE — Assessment & Plan Note (Signed)
He is currently taking Plavix 75 mg daily for his history of peripheral vascular disease status post left SFA angioplasty in 07/2015 and right SFA angioplasty 04/2015.  He denies any claudication. A/P: Currently stable.  We will continue Plavix 75 mg daily and start rosuvastatin 20 mg daily.

## 2018-05-04 NOTE — Assessment & Plan Note (Signed)
He is currently attending dialysis on Monday Wednesday Friday and reports tolerance.  He is being evaluated at [facility] for transplant and says that he is now on the renal transplant list. A/P: Currently stable.  Continue dialysis per nephrology.  Continue to follow with [facility] for potential transplant.

## 2018-05-04 NOTE — Assessment & Plan Note (Signed)
Last A1c was 5.6.  He is not on any medications for his diabetes as it is diet controlled. A/P: Repeat A1c is 5.7 his visit remains well controlled.  He will continue with dietary modifications and exercise as tolerated.  Repeat A1c in 6 months.

## 2018-05-04 NOTE — Assessment & Plan Note (Signed)
He has a history of hypertension that is now well controlled off of antihypertensive medications since starting dialysis. A/P: BP Readings from Last 3 Encounters:  05/03/18 105/72  09/21/17 (!) 97/59  03/30/17 121/71  Blood pressure is well controlled.  He denies any hypotension with dialysis.  We will continue to monitor.

## 2018-05-04 NOTE — Assessment & Plan Note (Signed)
Patient reports about a 2-year history of erectile dysfunction.  He reports having a normal libido but is unable to obtain or maintain an erection.  He says he does not experience morning erections.  He denies any symptoms of depression. A/P: Patient with reported erectile dysfunction.  He does have risk factors including diabetes and hypertension which are well controlled.  He also has history of peripheral vascular disease which can be contributing.  Of note he has had a prostate biopsy in April 2018 however he says his symptoms were occurring prior to that procedure.  His blood pressures have been borderline low, so we will withhold any medications at this time such as sildenafil.  He does state that he does not want to be on any medications for this issue at this time.  I recommended he continue to exercise as tolerated and we will add a high intensity statin given his history of peripheral vascular disease.

## 2018-05-04 NOTE — Assessment & Plan Note (Signed)
He reports feeling like he has low energy for the last few months.  He has begun exercising about 1 month ago and notices that he becomes fatigued after about 2 minutes of activity which includes stationary biking, treadmill.  He denies any obvious bleeding.  He has no personal history of thyroid dysfunction.  He denies any depression. A/P: He has recently developed low energy which is more pronounced than starting an exercise routine.  This may be due to conditioning.  Lab work this visit does not show any anemia and TSH is within normal limits.  We will continue to monitor.  Can consider checking an a.m. testosterone level if this persists given his report of erectile dysfunction.

## 2018-05-05 NOTE — Progress Notes (Signed)
Internal Medicine Clinic Attending  Case discussed with Dr. Patel at the time of the visit.  We reviewed the resident's history and exam and pertinent patient test results.  I agree with the assessment, diagnosis, and plan of care documented in the resident's note.  

## 2018-05-15 ENCOUNTER — Ambulatory Visit (INDEPENDENT_AMBULATORY_CARE_PROVIDER_SITE_OTHER): Payer: Medicaid Other | Admitting: Neurology

## 2018-05-15 ENCOUNTER — Other Ambulatory Visit (INDEPENDENT_AMBULATORY_CARE_PROVIDER_SITE_OTHER): Payer: Medicaid Other

## 2018-05-15 ENCOUNTER — Encounter: Payer: Self-pay | Admitting: Neurology

## 2018-05-15 VITALS — BP 120/70 | HR 116 | Ht 69.0 in | Wt 237.1 lb

## 2018-05-15 DIAGNOSIS — I739 Peripheral vascular disease, unspecified: Secondary | ICD-10-CM | POA: Diagnosis not present

## 2018-05-15 DIAGNOSIS — G63 Polyneuropathy in diseases classified elsewhere: Secondary | ICD-10-CM

## 2018-05-15 NOTE — Progress Notes (Signed)
Perry Thomas   Date: 05/15/18  Perry Thomas MRN: [id number] DOB: 1961/01/28   Dear Dr. Moshe Thomas:  Thank you for your kind referral of Perry Thomas for consultation of bilateral leg pain. Although his history is well known to you, please allow [place] to reiterate it for the purpose of our medical record. The patient was accompanied to the clinic by self.    History of Present Illness: Perry Thomas is a 57 y.o. right-handed Perry Thomas male with diabetes mellitus, ESRD on hemodialysis (MWF), hypertension, PAD s/p bilateral SFA angioplasty presenting for evaluation of bilateral leg pain.    Starting around early 2018, he began experiencing bilateral calf pain which is always triggered by walking or climbing.  He can walk upto 5 minutes comfortably, but beyond this, he has to sit to rest or stand to have relief.  Pain is described as achy, throbbing, and sensation of weakness in the lower legs.  He denies similar symptoms in the thighs or associated low back pain.  He sometimes has right flank and low back pain which is achy, but no associated with his leg discomfort.  He has peripheral vascular disease and had angioplasty to bilateral SFA several years ago.  He has not had any surveillance imaging.   For the past few years, he has some numbness and tingling of the feet, worse on the left.  Numbness does not extend up his legs.  He does not use a cane and has not suffered any falls.     Out-side paper records, electronic medical record, and images have been reviewed where available and summarized as:  Lab Results  Component Value Date   HGBA1C 5.7 (A) 05/03/2018   Lab Results  Component Value Date   TSH 0.962 05/03/2018   CT head wo contrast 03/11/2008:  No acute intracranial abnormality  Past Medical History:  Diagnosis Date  . Diabetes mellitus without complication ([place])   . ESRD on dialysis ([place])   . Hypertension   . PAD  (peripheral artery disease) ([place])     Past Surgical History:  Procedure Laterality Date  . PTCA     peripheral artery disease     Medications:  Outpatient Encounter Medications as of 05/15/2018  Medication Sig  . calcium acetate (PHOSLO) 667 MG capsule TAKE 3 CAPSULES BY MOUTH THREE TIMES DAILY WITH MEALS  . cinacalcet (SENSIPAR) 30 MG tablet Take 1 tablet (30 mg total) by mouth daily with supper.  . clopidogrel (PLAVIX) 75 MG tablet TAKE 1 TABLET BY MOUTH DAILY.  . multivitamin (RENA-VIT) TABS tablet Take 1 tablet by mouth daily.  . rosuvastatin (CRESTOR) 20 MG tablet Take 1 tablet (20 mg total) by mouth at bedtime.  Marland Kitchen SIMBRINZA 1-0.2 % SUSP INSTILL 1 DROP INTO LEFT EYE TWICE A DAY  . [DISCONTINUED] cetirizine (ZYRTEC) 5 MG tablet Take 1 tablet (5 mg total) by mouth daily.  . [DISCONTINUED] fluticasone (FLONASE) 50 MCG/ACT nasal spray Place 2 sprays into both nostrils daily.   No facility-administered encounter medications on file as of 05/15/2018.      Allergies: No Known Allergies  Family History: Family History  Problem Relation Age of Onset  . Healthy Mother   . Liver cancer Father   . Kidney disease Sister     Social History: Social History   Tobacco Use  . Smoking status: Former Smoker    Last attempt to quit: 12/13/2009    Years since quitting: 8.4  . Smokeless tobacco:  Never Used  Substance Use Topics  . Alcohol use: No    Alcohol/week: 0.0 oz  . Drug use: No   Social History   Social History Narrative   Lives with niece in an apartment on the first floor.  On disability for dialysis.      Review of Systems:  CONSTITUTIONAL: No fevers, chills, night sweats, or weight loss.   EYES: No visual changes or eye pain ENT: No hearing changes.  No history of nose bleeds.   RESPIRATORY: No cough, wheezing and shortness of breath.   CARDIOVASCULAR: Negative for chest pain, and palpitations.   GI: Negative for abdominal discomfort, blood in stools or black stools.   No recent change in bowel habits.   GU:  No history of incontinence.   MUSCLOSKELETAL: +history of joint pain or swelling.  No myalgias.   SKIN: Negative for lesions, rash, and itching.  + left forearm fistula HEMATOLOGY/ONCOLOGY: Negative for prolonged bleeding, bruising easily, and swollen nodes.  No history of cancer.   ENDOCRINE: Negative for cold or heat intolerance, polydipsia or goiter.   PSYCH:  No depression or anxiety symptoms.   NEURO: As Above.   Vital Signs:  BP 120/70   Pulse (!) 116   Ht 5\' 9"  (1.753 m)   Wt 237 lb 2 oz (107.6 kg)   SpO2 97%   BMI 35.02 kg/m    General Medical Exam:   General:  Well appearing, comfortable.   Eyes/ENT: see cranial nerve examination.   Neck: No masses appreciated.  Full range of motion without tenderness.  No carotid bruits. Respiratory:  Clear to auscultation, good air entry bilaterally.   Cardiac:  Regular rate and rhythm, no murmur.   Extremities:  Left distal forearm fistula.   Skin:  No rashes or lesions.  Neurological Exam: MENTAL STATUS including orientation to time, place, person, recent and remote memory, attention span and concentration, language, and fund of knowledge is normal.  Speech is not dysarthric.  CRANIAL NERVES: II:  No visual field defects.  Unremarkable fundi.  Dense lens opacity on the right.   III-IV-VI: Pupils equal round and reactive to light.  Normal conjugate, extra-ocular eye movements in all directions of gaze.  No nystagmus.  No ptosis.   V:  Normal facial sensation.   VII:  Normal facial symmetry and movements.   VIII:  Normal hearing and vestibular function.   IX-X:  Normal palatal movement.   XI:  Normal shoulder shrug and head rotation.   XII:  Normal tongue strength and range of motion, no deviation or fasciculation.  MOTOR:  No atrophy, fasciculations or abnormal movements.  No pronator drift.  Tone is normal.    Right Upper Extremity:    Left Upper Extremity:    Deltoid  5/5   Deltoid   5/5   Biceps  5/5   Biceps  5/5   Triceps  5/5   Triceps  5/5   Wrist extensors  5/5   Wrist extensors  5/5   Wrist flexors  5/5   Wrist flexors  5/5   Finger extensors  5/5   Finger extensors  5/5   Finger flexors  5/5   Finger flexors  5/5   Dorsal interossei  5/5   Dorsal interossei  5/5   Abductor pollicis  5/5   Abductor pollicis  5/5   Tone (Ashworth scale)  0  Tone (Ashworth scale)  0   Right Lower Extremity:    Left Lower Extremity:  Hip flexors  5/5   Hip flexors  5/5   Hip extensors  5/5   Hip extensors  5/5   Knee flexors  5/5   Knee flexors  5/5   Knee extensors  5/5   Knee extensors  5/5   Dorsiflexors  5/5   Dorsiflexors  5/5   Plantarflexors  5/5   Plantarflexors  5/5   Toe extensors  5/5   Toe extensors  5/5   Toe flexors  5/5   Toe flexors  5/5   Tone (Ashworth scale)  0  Tone (Ashworth scale)  0   MSRs:  Right                                                                 Left brachioradialis 2+  brachioradialis 2+  biceps 2+  biceps 2+  triceps 2+  triceps 2+  patellar 1+  Patellar 1+  ankle jerk 0  ankle jerk 0  Hoffman no  Hoffman no  plantar response down  plantar response down   SENSORY:  Vibration is absent at the left ankle and great toe, intact on the right.  Temperature and pin prick is mildly reduced distally.  Romberg's sign absent.   COORDINATION/GAIT: Normal finger-to- nose-finger.  Intact rapid alternating movements bilaterally.  Able to rise from a chair without using arms.  Gait narrow based and stable.   IMPRESSION: Mr. Grounds is a delightful 57 year-old man referred for evaluation of bilateral leg pain.  His symptoms are symptoms are not classic for neurogenic claudication and more concerning for vascular claudication.  He would benefit from arterial studies of the legs and I will kindly defer to his PCP to help coordinate evaluation for this.  There are no signs of myelopathy on his exam making spinal stenosis less likely.   He also has  distal sensory loss, worse on the left, which is consistent with peripheral neuropathy most likely contributed by peripheral arterial disease, end-stage renal disease, and less likely diabetes, which is well-controlled. To be complete, I will check for treatable causes of neuropathy including vitamin B12, folate, and copper.    Thank you for allowing me to participate in patient's care.  If I can answer any additional questions, I would be pleased to do so.    Sincerely,    Donika K. Posey Pronto, DO

## 2018-05-15 NOTE — Patient Instructions (Signed)
Check labs  I will reach out to your primary care provider about checking your legs for arterial disease

## 2018-05-16 LAB — VITAMIN B12: VITAMIN B 12: 1104 pg/mL — AB (ref 211–911)

## 2018-05-16 LAB — FOLATE: Folate: >24.1 ng/mL (ref >5.9)

## 2018-05-17 ENCOUNTER — Other Ambulatory Visit: Payer: Self-pay | Admitting: *Deleted

## 2018-05-17 ENCOUNTER — Other Ambulatory Visit: Payer: Self-pay

## 2018-05-17 ENCOUNTER — Telehealth: Payer: Self-pay | Admitting: *Deleted

## 2018-05-17 DIAGNOSIS — I739 Peripheral vascular disease, unspecified: Secondary | ICD-10-CM

## 2018-05-17 DIAGNOSIS — G63 Polyneuropathy in diseases classified elsewhere: Secondary | ICD-10-CM

## 2018-05-17 LAB — COPPER, SERUM: Copper: 119 ug/dL (ref 70–175)

## 2018-05-17 NOTE — Telephone Encounter (Signed)
-----   Message from Alda Berthold, DO sent at 05/17/2018  8:10 AM EDT ----- Please notify patient lab are within normal limits and let him know that I think it's best that he see vascular specialist for his claudication and peripheral vascular disease, so they can better evaluate his symptoms of leg pain.  Refer to vascular surgery. Thank you.

## 2018-05-17 NOTE — Telephone Encounter (Signed)
Patient given results and instructions.  Referral sent to [facility].

## 2018-07-04 ENCOUNTER — Ambulatory Visit (INDEPENDENT_AMBULATORY_CARE_PROVIDER_SITE_OTHER): Payer: Medicaid Other | Admitting: Vascular Surgery

## 2018-07-04 ENCOUNTER — Encounter: Payer: Self-pay | Admitting: *Deleted

## 2018-07-04 ENCOUNTER — Ambulatory Visit (HOSPITAL_COMMUNITY)
Admission: RE | Admit: 2018-07-04 | Discharge: 2018-07-04 | Disposition: A | Discharge status: Home / Self Care | Payer: Medicaid Other | Source: Ambulatory Visit | Attending: Vascular Surgery | Admitting: Vascular Surgery

## 2018-07-04 ENCOUNTER — Encounter: Payer: Self-pay | Admitting: Vascular Surgery

## 2018-07-04 VITALS — BP 130/78 | HR 83 | Resp 20 | Ht 69.0 in | Wt 241.5 lb

## 2018-07-04 DIAGNOSIS — I739 Peripheral vascular disease, unspecified: Secondary | ICD-10-CM

## 2018-07-04 NOTE — Progress Notes (Signed)
[facility]  Patient name: Perry Thomas MRN: [id number] DOB: [birth year] Sex: male  REASON FOR CONSULT: Bilateral lower extremity claudication  HPI: Perry Thomas is a 57 y.o. male, who is her today for evaluation of bilateral lower extremity claudication.  He is a very gentleman who reports discomfort in his calf with prolonged walking.  He does do a great deal of walking for exercise and is mildly limited by this if he walks more than usual.  He reports that he can walk several miles before he has difficulty.  This is not limiting to him.  He does deny any prior lower extremity tissue loss reports that he heals up simple cuts and scrapes on his feet quite easily.  Apparently he had episode of acute renal failure while in [place] approximately 5 years ago and was quite ill and had initiation of hemodialysis that time.  He initially had a catheter and then had a left radiocephalic fistula created there and has had very nice result of this since that time.  Apparently he was having claudication symptoms at that time as well and underwent bilateral endovascular treatment of superficial femoral artery disease at [facility] in [place].  He reports that he had initial improvement in his claudication in his calves but now has had some recurrence which is not as severe as it had been prior to this.  He denies any cardiac disease and denies any history of stroke  Past Medical History:  Diagnosis Date  . Diabetes mellitus without complication ([place])   . ESRD on dialysis ([place])   . Hypertension   . PAD (peripheral artery disease) ([facility])     Family History  Problem Relation Age of Onset  . Healthy Mother   . Liver cancer Father   . Kidney disease Sister     SOCIAL HISTORY: Social History   Socioeconomic History  . Marital status: Single    Spouse name: Not on file  . Number of children: 1  . Years of  education: 18  . Highest education level: Associate degree: occupational, Hotel manager, or vocational program  Occupational History  . Occupation: disability    Comment: retired Arboriculturist  . Financial resource strain: Not on file  . Food insecurity:    Worry: Not on file    Inability: Not on file  . Transportation needs:    Medical: Not on file    Non-medical: Not on file  Tobacco Use  . Smoking status: Former Smoker    Last attempt to quit: 12/13/2009    Years since quitting: 8.5  . Smokeless tobacco: Never Used  Substance and Sexual Activity  . Alcohol use: No    Alcohol/week: 0.0 oz  . Drug use: No  . Sexual activity: Not on file  Lifestyle  . Physical activity:    Days per week: Not on file    Minutes per session: Not on file  . Stress: Not on file  Relationships  . Social connections:    Talks on phone: Not on file    Gets together: Not on file    Attends religious service: Not on file    Active member of club or organization: Not on file    Attends meetings of clubs or organizations: Not on file    Relationship status: Not on file  . Intimate partner violence:    Fear of current or ex partner: Not on file  Emotionally abused: Not on file    Physically abused: Not on file    Forced sexual activity: Not on file  Other Topics Concern  . Not on file  Social History Narrative   Lives with niece in an apartment on the first floor.  On disability for dialysis.      No Known Allergies  Current Outpatient Medications  Medication Sig Dispense Refill  . calcium acetate (PHOSLO) 667 MG capsule TAKE 3 CAPSULES BY MOUTH THREE TIMES DAILY WITH MEALS  3  . cinacalcet (SENSIPAR) 30 MG tablet Take 1 tablet (30 mg total) by mouth daily with supper. 60 tablet   . clopidogrel (PLAVIX) 75 MG tablet TAKE 1 TABLET BY MOUTH DAILY. 90 tablet 3  . multivitamin (RENA-VIT) TABS tablet Take 1 tablet by mouth daily.    . rosuvastatin (CRESTOR) 20 MG tablet Take 1 tablet (20  mg total) by mouth at bedtime. 90 tablet 3  . SIMBRINZA 1-0.2 % SUSP INSTILL 1 DROP INTO LEFT EYE TWICE A DAY  4   No current facility-administered medications for this visit.     REVIEW OF SYSTEMS:  [X]  denotes positive finding, [ ]  denotes negative finding Cardiac  Comments:  Chest pain or chest pressure:    Shortness of breath upon exertion:    Short of breath when lying flat:    Irregular heart rhythm:        Vascular    Pain in calf, thigh, or hip brought on by ambulation: x   Pain in feet at night that wakes you up from your sleep:  x   Blood clot in your veins:    Leg swelling:         Pulmonary    Oxygen at home:    Productive cough:     Wheezing:         Neurologic    Sudden weakness in arms or legs:     Sudden numbness in arms or legs:     Sudden onset of difficulty speaking or slurred speech:    Temporary loss of vision in one eye:     Problems with dizziness:         Gastrointestinal    Blood in stool:     Vomited blood:         Genitourinary    Burning when urinating:     Blood in urine:        Psychiatric    Major depression:         Hematologic    Bleeding problems:    Problems with blood clotting too easily:        Skin    Rashes or ulcers:        Constitutional    Fever or chills:      PHYSICAL EXAM: Vitals:   07/04/18 1310  BP: 130/78  Pulse: 83  Resp: 20  SpO2: 99%  Weight: 241 lb 8 oz (109.5 kg)  Height: 5\' 9"  (1.753 m)    GENERAL: The patient is a well-nourished male, in no acute distress. The vital signs are documented above. CARDIOVASCULAR: Carotid arteries without bruits bilaterally.  2+ right radial pulse.  He has a well-developed radiocephalic fistula in the left with an excellent thrill.  He has 2+ femoral pulses and 1+ right posterior tibial pulse in 1-2+ left dorsalis pedis pulse PULMONARY: There is good air exchange  ABDOMEN: Soft and non-tender  MUSCULOSKELETAL: There are no major deformities or cyanosis. NEUROLOGIC:  No focal  weakness or paresthesias are detected. SKIN: There are no ulcers or rashes noted. PSYCHIATRIC: The patient has a normal affect.  DATA:  Noninvasive vascular studies revealed calcified vessels which were noncompressible.  He did have biphasic pedal flow bilaterally  MEDICAL ISSUES: Long discussion with patient regarding his symptoms.  I explained that this is not limb threatening and he certainly is not limited regarding his ability to function.  He does enjoy walking program and does quite a great deal of walking.  I have encouraged him to continue this.  He does not smoke cigarettes.  I did caution him that if he developed progressive symptoms or any nonhealing ulceration that we would need to proceed with further evaluation and treatment.  He is comfortable with this discussion will see [place] again on an as-needed basis   Rosetta Posner, MD [facility] Tel [phone] Pager [phone]

## 2018-07-22 ENCOUNTER — Other Ambulatory Visit: Payer: Self-pay | Admitting: Internal Medicine

## 2018-07-22 DIAGNOSIS — I739 Peripheral vascular disease, unspecified: Secondary | ICD-10-CM

## 2018-07-25 NOTE — Telephone Encounter (Signed)
Pls sch Nov ish appt PCP DM F/U

## 2018-08-01 ENCOUNTER — Encounter: Payer: Self-pay | Admitting: Podiatry

## 2018-08-01 ENCOUNTER — Ambulatory Visit: Payer: Medicaid Other | Admitting: Podiatry

## 2018-08-01 DIAGNOSIS — B351 Tinea unguium: Secondary | ICD-10-CM

## 2018-08-01 DIAGNOSIS — M79676 Pain in unspecified toe(s): Secondary | ICD-10-CM | POA: Diagnosis not present

## 2018-08-01 DIAGNOSIS — D689 Coagulation defect, unspecified: Secondary | ICD-10-CM

## 2018-08-01 DIAGNOSIS — E119 Type 2 diabetes mellitus without complications: Secondary | ICD-10-CM

## 2018-08-01 DIAGNOSIS — Q828 Other specified congenital malformations of skin: Secondary | ICD-10-CM

## 2018-08-01 NOTE — Progress Notes (Signed)
Patient ID: Perry Thomas, male   DOB: [birth year], 57 y.o.   MRN: [id number] Complaint:  Visit Type: Patient returns to my office for continued preventative foot care services. Complaint: Patient states" my nails have grown long and thick and become painful to walk and wear shoes" Patient has been diagnosed with DM with no foot complications. Diet controlled. The patient presents for preventative foot care services. No changes to ROS.  Patient is taking plavix.  Podiatric Exam: Vascular: dorsalis pedis and posterior tibial pulses are palpable bilateral. Capillary return is immediate. Temperature gradient is WNL. Skin turgor WNL  Sensorium: Normal Semmes Weinstein monofilament test. Normal tactile sensation bilaterally. Nail Exam: Pt has thick disfigured discolored nails with subungual debris noted bilateral entire nail hallux through fifth toenails Ulcer Exam: There is no evidence of ulcer or pre-ulcerative changes or infection. Orthopedic Exam: Muscle tone and strength are WNL. No limitations in general ROM. No crepitus or effusions noted. Foot type and digits show no abnormalities. Bony prominences are unremarkable. Skin:  Porokeratosis sub 5 left.. No infection or ulcers  Diagnosis:  Onychomycosis, , Pain in right toe, pain in left toes  Treatment & Plan Procedures and Treatment: Consent by patient was obtained for treatment procedures. The patient understood the discussion of treatment and procedures well. All questions were answered thoroughly reviewed. Debridement of mycotic and hypertrophic toenails, 1 through 5 bilateral and clearing of subungual debris. No ulceration, no infection noted. Return Visit-Office Procedure: Patient instructed to return to the office for a follow up visit 10 weeks  for continued evaluation and treatment.    Gardiner Barefoot DPM

## 2018-09-20 ENCOUNTER — Ambulatory Visit (INDEPENDENT_AMBULATORY_CARE_PROVIDER_SITE_OTHER): Payer: Medicaid Other | Admitting: Internal Medicine

## 2018-09-20 ENCOUNTER — Other Ambulatory Visit: Payer: Self-pay

## 2018-09-20 VITALS — BP 114/63 | HR 90 | Temp 98.7°F | Ht 69.0 in | Wt 239.8 lb

## 2018-09-20 DIAGNOSIS — E1122 Type 2 diabetes mellitus with diabetic chronic kidney disease: Secondary | ICD-10-CM | POA: Diagnosis not present

## 2018-09-20 DIAGNOSIS — J0191 Acute recurrent sinusitis, unspecified: Secondary | ICD-10-CM | POA: Diagnosis present

## 2018-09-20 DIAGNOSIS — B9689 Other specified bacterial agents as the cause of diseases classified elsewhere: Secondary | ICD-10-CM

## 2018-09-20 DIAGNOSIS — Z992 Dependence on renal dialysis: Secondary | ICD-10-CM

## 2018-09-20 DIAGNOSIS — N186 End stage renal disease: Secondary | ICD-10-CM

## 2018-09-20 DIAGNOSIS — J019 Acute sinusitis, unspecified: Secondary | ICD-10-CM | POA: Insufficient documentation

## 2018-09-20 MED ORDER — AMOXICILLIN-POT CLAVULANATE 500-125 MG PO TABS: ORAL_TABLET | ORAL | 0 refills | Status: DC

## 2018-09-20 NOTE — Assessment & Plan Note (Signed)
Sinusitis: Foul odor x 8 days associated with nasal discharge of green/yellow that smells terrible, rare cough 1-2 daily with rare mucus produced, throat was sore initially but now improved. Denied fever, chills, headaches, nausea, vomiting, diarrhea, ear aches, visual changes, rhinorrhea (initially was thin) or chest pain. Diabetic with ESRD with symptoms greater than 7 days and at risk for not returning to the clinic until 10/15 if at all due to transportation issues and his dialysis schedule.  Patient stated that this is not similar to his prior history of allergic sinusitis and is most like his prior bacterial sinusitis that resolved with antibiotics several years prior. As such, we will treat for bacterial sinusitis   Plan Augmentin dosed for ESRD He will take a 500/125 tablet daily on non-dialysis days, then take one tablet during and one after dialysis on dialysis days for seven days. He is aware and could read back the recommended schedule and dosing.

## 2018-09-20 NOTE — Progress Notes (Signed)
   CC: nasal drainage  HPI:[name] is a 57 y.o. male who presents for evaluation of nasal drainage. Please see individual problem based A/P for details.  Sinusitis: Foul odor x 8 days associated with nasal discharge of green/yellow that smells terrible, rare cough 1-2 daily with rare mucus produced, throat was sore initially but now improved. Denied fever, chills, headaches, nausea, vomiting, diarrhea, ear aches, visual changes, rhinorrhea (initially was thin) or chest pain. Diabetic with ESRD with symptoms greater than 7 days and at risk for not returning to the clinic until 10/15 if at all due to transportation issues and his dialysis schedule.  Patient stated that this is not similar to his prior history of allergic sinusitis and is most like his prior bacterial sinusitis that resolved with antibiotics several years prior. As such, we will treat for bacterial sinusitis   Plan Augmentin dosed for ESRD He will take a 500/125 tablet daily on non-dialysis days, then take one tablet during and one after dialysis on dialysis days for seven days. He is aware and could read back the recommended schedule and dosing.   PHQ-9 Score of three for generalized fatigue but stated "I love life and enjoy every bit of it". Will monitor   Past Medical History:  Diagnosis Date  . Diabetes mellitus without complication ([place])   . ESRD on dialysis ([place])   . Hypertension   . PAD (peripheral artery disease) ([place])    Review of Systems:  ROS negative except as per HPI.  Physical Exam: Vitals:   09/20/18 1526  BP: 114/63  Pulse: 90  Temp: 98.7 F (37.1 C)  TempSrc: Oral  SpO2: 99%  Weight: 239 lb 12.8 oz (108.8 kg)  Height: 5\' 9"  (1.753 m)   General: A/O x 4, in on apparent distress, afebrile, nondiaphoretic  Eyes: Cornea clear not injected, no discharge, PERRL Head: No trauma noted, no pain on palpation of the sinuses externally  Ears: Minimal cerumen noted bilaterally, denied pain on exam,  left tympanic membrane partially occluded by cerumen, tympanic membranes absent observable fluid level with cone of light present  Nose: Visible purulent mucus bilaterally Mouth: No plaques, erythema noted Neck: No lymphadenopathy palpated or tenderness to palpation Cardio: RRR, no mrgs Pulmonary: CTA bilaterally  Assessment & Plan:   See Encounters Tab for problem based charting.  Patient discussed with Dr. Eppie Gibson

## 2018-09-20 NOTE — Patient Instructions (Signed)
FOLLOW-UP INSTRUCTIONS When: if symptoms worsen or fail to improve  I have added Augmentin for you today. You will need to take one tablet daily on non-dialysis days. Also, take one tablet in the middle and one tablet at the end of each dialysis treatment on dialysis days.   As always if your symptoms worsen, fail to improve, or your develop other concerning symptoms, please notify our office or visit the local ER if we are unavailable. Symptoms including severe headache, visual changes, fever or other, should not be ignored and should encourage you to visit the ED if we are unavailable.   Thank you for your visit to the [facility] today.

## 2018-09-21 NOTE — Progress Notes (Signed)
Case discussed with Dr. Berline Lopes at the time of the visit.  We reviewed the resident's history and exam and pertinent patient test results.  I agree with the assessment, diagnosis, and plan of care documented in the resident's note.

## 2018-10-10 ENCOUNTER — Ambulatory Visit: Payer: Medicaid Other | Admitting: Podiatry

## 2018-10-10 ENCOUNTER — Encounter: Payer: Self-pay | Admitting: Podiatry

## 2018-10-10 DIAGNOSIS — B351 Tinea unguium: Secondary | ICD-10-CM | POA: Diagnosis not present

## 2018-10-10 DIAGNOSIS — M79676 Pain in unspecified toe(s): Secondary | ICD-10-CM

## 2018-10-10 DIAGNOSIS — Q828 Other specified congenital malformations of skin: Secondary | ICD-10-CM | POA: Diagnosis not present

## 2018-10-10 DIAGNOSIS — D689 Coagulation defect, unspecified: Secondary | ICD-10-CM

## 2018-10-10 DIAGNOSIS — E119 Type 2 diabetes mellitus without complications: Secondary | ICD-10-CM

## 2018-10-10 NOTE — Progress Notes (Signed)
Patient ID: Perry Thomas, male   DOB: 18-Jul-1961, 57 y.o.   MRN: [id number] Complaint:  Visit Type: Patient returns to my office for continued preventative foot care services. Complaint: Patient states" my nails have grown long and thick and become painful to walk and wear shoes" Patient has been diagnosed with DM with no foot complications. Diet controlled. The patient presents for preventative foot care services. No changes to ROS.  Patient is taking plavix.  Podiatric Exam: Vascular: dorsalis pedis and posterior tibial pulses are palpable bilateral. Capillary return is immediate. Temperature gradient is WNL. Skin turgor WNL  Sensorium: Normal Semmes Weinstein monofilament test. Normal tactile sensation bilaterally. Nail Exam: Pt has thick disfigured discolored nails with subungual debris noted bilateral entire nail hallux through fifth toenails Ulcer Exam: There is no evidence of ulcer or pre-ulcerative changes or infection. Orthopedic Exam: Muscle tone and strength are WNL. No limitations in general ROM. No crepitus or effusions noted. Foot type and digits show no abnormalities. Bony prominences are unremarkable. Skin:  Porokeratosis sub 5 left.. No infection or ulcers  Diagnosis:  Onychomycosis, , Pain in right toe, pain in left toes  Treatment & Plan Procedures and Treatment: Consent by patient was obtained for treatment procedures. The patient understood the discussion of treatment and procedures well. All questions were answered thoroughly reviewed. Debridement of mycotic and hypertrophic toenails, 1 through 5 bilateral and clearing of subungual debris. No ulceration, no infection noted. Return Visit-Office Procedure: Patient instructed to return to the office for a follow up visit 10 weeks  for continued evaluation and treatment.    Gardiner Barefoot DPM

## 2018-10-18 ENCOUNTER — Encounter: Payer: Medicaid Other | Admitting: Internal Medicine

## 2019-01-02 ENCOUNTER — Ambulatory Visit: Payer: Medicaid Other | Admitting: Podiatry

## 2019-01-08 ENCOUNTER — Ambulatory Visit: Payer: Medicaid Other | Admitting: Internal Medicine

## 2019-01-08 ENCOUNTER — Other Ambulatory Visit: Payer: Self-pay

## 2019-01-08 VITALS — BP 126/66 | HR 103 | Temp 98.2°F | Ht 69.0 in | Wt 238.0 lb

## 2019-01-08 DIAGNOSIS — B9689 Other specified bacterial agents as the cause of diseases classified elsewhere: Secondary | ICD-10-CM

## 2019-01-08 DIAGNOSIS — H109 Unspecified conjunctivitis: Secondary | ICD-10-CM | POA: Insufficient documentation

## 2019-01-08 DIAGNOSIS — J029 Acute pharyngitis, unspecified: Secondary | ICD-10-CM

## 2019-01-08 MED ORDER — BACITRACIN-POLYMYXIN B 500-10000 UNIT/GM OP OINT: TOPICAL_OINTMENT | Freq: Four times a day (QID) | OPHTHALMIC | 0 refills | Status: AC

## 2019-01-08 NOTE — Progress Notes (Signed)
   CC: Sore throat  HPI:  Perry Thomas is a 58 y.o. male with past medical history listed below, presented to clinic today due to sore throat and bilateral eye discharge.  Please refer to problem based charting for further details and assessment plan.  Past Medical History:  Diagnosis Date  . Diabetes mellitus without complication ([place])   . ESRD on dialysis ([place])   . Hypertension   . PAD (peripheral artery disease) ([place])    Review of Systems:   No fever, no chills, no body ache. No chest pain, no shortness of breath Occasional cough   Physical Exam:  Vitals:   01/08/19 1313  BP: 126/66  Pulse: (!) 103  Temp: 98.2 F (36.8 C)  TempSrc: Oral  SpO2: 98%  Weight: 238 lb (108 kg)  Height: 5\' 9"  (1.753 m)  Vital signs reviewed Generalized: Pleasant gentleman, sitting on the chair in no acute distress Head and neck: Extraocular movements are normal.  Conjunctiva is erythematous, white discharge is present.  Pharyngeal exam: No anterior/posterior cervical lymphadenopathy or tenderness.  No submandibular lymphadenopathy. Pharyngeal exam: Erythema is present.  No exudate.  No PND. CV: RRR, no murmur Lung exam: CTA bilaterally, no wheeze, no rale Abdomen: Soft and nontender to palpation Extremities: Left arm fistula intact and has bruit No lower extremity edema.  Pulses are normal bilaterally   Assessment & Plan:   See Encounters Tab for problem based charting.  Patient discussed with Dr. Evette Doffing

## 2019-01-08 NOTE — Assessment & Plan Note (Signed)
Patient presented with 1 week history of bilateral white eye discharge.  Also reports some erythema on her eyes.  Sometimes has blurred vision in the morning due to discharge but otherwise no acute visual changes. Presentation and physical exam suggestive of bacterial conjunctivitis.  Vision test is at baseline. -Bacitracin- polymyxin ophthalmic ointment every 6 hours until improving -Follow-up in clinic in a week if no improvement

## 2019-01-08 NOTE — Assessment & Plan Note (Addendum)
Patient reports 1 week history of sore throat. No fever no chills. Occasional cough. No sick contact. No exudate on exam, no cervical lymphadenopathy or tenderness.  No evidence of streptococcal pharyngitis. -1/0 score on Centor criteria    likely viralIs but not suggestive of flu.  No antibiotic required.  Recommended symptomatic therapy.  Follow-up in clinic in the next 1 to 2 weeks if worsening of symptoms.

## 2019-01-08 NOTE — Patient Instructions (Signed)
Thank you for allowing [place] to provide your care today. Today you were seen due to sore throat and eye discharge. Your sore throat seem to be due to viral infection.  Please try to drink warm liquid and eat soup.  You can also use over-the-counter cold medications. But your eyes symptoms are due to bacterial infection.  Prescribed you antibiotic ointment please use that as instructed.   Today we not make any changes to your medications.    Please follow-up in clinic if your symptoms worsen in next 1 to 2 weeks.  Should you have any questions or concerns please call the internal medicine clinic at [phone].

## 2019-01-09 NOTE — Progress Notes (Signed)
Internal Medicine Clinic Attending  Case discussed with Dr. Masoudi  at the time of the visit.  We reviewed the resident's history and exam and pertinent patient test results.  I agree with the assessment, diagnosis, and plan of care documented in the resident's note.  

## 2019-01-11 ENCOUNTER — Encounter: Payer: Self-pay | Admitting: *Deleted

## 2019-01-11 LAB — HM DIABETES EYE EXAM

## 2019-02-06 ENCOUNTER — Ambulatory Visit: Payer: Medicaid Other | Admitting: Podiatry

## 2019-03-06 ENCOUNTER — Telehealth: Payer: Self-pay | Admitting: Internal Medicine

## 2019-03-06 ENCOUNTER — Encounter: Payer: Medicaid Other | Admitting: Internal Medicine

## 2019-03-06 ENCOUNTER — Telehealth (INDEPENDENT_AMBULATORY_CARE_PROVIDER_SITE_OTHER): Payer: Medicaid Other | Admitting: Internal Medicine

## 2019-03-06 ENCOUNTER — Other Ambulatory Visit: Payer: Self-pay

## 2019-03-06 DIAGNOSIS — N186 End stage renal disease: Secondary | ICD-10-CM

## 2019-03-06 DIAGNOSIS — J302 Other seasonal allergic rhinitis: Secondary | ICD-10-CM

## 2019-03-06 DIAGNOSIS — I739 Peripheral vascular disease, unspecified: Secondary | ICD-10-CM | POA: Diagnosis not present

## 2019-03-06 DIAGNOSIS — Z992 Dependence on renal dialysis: Secondary | ICD-10-CM

## 2019-03-06 NOTE — Telephone Encounter (Signed)
Attempted to call Perry Thomas on his mobile phone number to set up virtual visit. He did not pick up. Voice mail not set up.

## 2019-03-13 ENCOUNTER — Encounter: Payer: Medicaid Other | Admitting: Internal Medicine

## 2019-03-13 ENCOUNTER — Other Ambulatory Visit: Payer: Self-pay

## 2019-03-13 NOTE — Assessment & Plan Note (Signed)
Presents with complaints of rhinitis and sore throat. States these symptoms are chronic. Denies any fever, dry cough, sick contact, malaise. Discussed importance of hand hygiene and social distancing in light of recent pandemic. Perry Thomas states he limits his travel mostly to his dialysis center.  - Recommend OTC Claritin as needed

## 2019-03-13 NOTE — Progress Notes (Signed)
  This is a telephone encounter between Perry Thomas and Perry Thomas on 03/13/2019 for rhinitis. The visit was conducted with the patient located at home and Perry Thomas at [facility]. The patient's identity was confirmed using their DOB and current address. The patient has consented to being evaluated through a telephone encounter and understands the associated risks (an examination cannot be done and the patient may need to come in for an appointment) / benefits (allows the patient to remain at home, decreasing exposure to coronavirus). I personally spent 16 minutes on medical discussion.   CC: Runny nose  HPI: Perry Thomas is a 58 y.o. w/ PMH of ESRD on HD, HTN, PVD and diet-controlled T2DM presenting to telephone visit for management of his chronic conditions. He states he has been staying safe and besides attending his dialysis center, has been staying home mostly with social distancing.  Requesting refill on his clopidogrel but otherwise all of his medications are doing well. Discussed last clinic visit for conjunctivitis and states he had resolution of symptoms after taking eye ointment as prescribed. No other complaints at this time.  Past Medical History:  Diagnosis Date  . Diabetes mellitus without complication ([place])   . ESRD on dialysis ([place])   . Hypertension   . PAD (peripheral artery disease) ([place])    Review of Systems: Review of Systems  Constitutional: Negative for chills, fever and malaise/fatigue.  HENT: Positive for sore throat. Negative for congestion, hearing loss and sinus pain.        Rhinitis  Eyes: Negative for pain, discharge and redness.  Respiratory: Negative for cough, shortness of breath and wheezing.   Cardiovascular: Negative for chest pain and palpitations.  Gastrointestinal: Negative for constipation, diarrhea, nausea and vomiting.    Assessment & Plan:   Peripheral vascular disease ([place]) Pt requires refills on medications with associated diagnosis above.   Reviewed disease process and find this medication to be necessary, will not change dose or alter current therapy. No current claudication symptoms.  - C/w plavix 75mg  daily, rosuvastatin 20mg  daily  Rhinitis, allergic Presents with complaints of rhinitis and sore throat. States these symptoms are chronic. Denies any fever, dry cough, sick contact, malaise. Discussed importance of hand hygiene and social distancing in light of recent pandemic. Perry Thomas states he limits his travel mostly to his dialysis center.  - Recommend OTC Claritin as needed  ESRD (end stage renal disease) on dialysis [facility]) Continuing to attend dialysis MWF without difficulty. Denies any dyspnea, lower extremity swelling, numbness, tingling, chest pain, palpitations. States he is undergoing additional testing at [facility] due to 'some water they found on my kidneys' before proceeding with work-up for transplant. Currently denies any dysuria, urgency, frequency. Continues to make minimal urine.  - C/w dialysis and f/u with [facility] transplant team.    Patient discussed with Dr. Evette Doffing   -Gilberto Better, PGY1

## 2019-03-13 NOTE — Assessment & Plan Note (Signed)
Continuing to attend dialysis MWF without difficulty. Denies any dyspnea, lower extremity swelling, numbness, tingling, chest pain, palpitations. States he is undergoing additional testing at [facility] due to 'some water they found on my kidneys' before proceeding with work-up for transplant. Currently denies any dysuria, urgency, frequency. Continues to make minimal urine.  - C/w dialysis and f/u with [facility] transplant team.

## 2019-03-13 NOTE — Assessment & Plan Note (Addendum)
Pt requires refills on medications with associated diagnosis above.  Reviewed disease process and find this medication to be necessary, will not change dose or alter current therapy. No current claudication symptoms.  - C/w plavix 75mg  daily, rosuvastatin 20mg  daily

## 2019-03-14 NOTE — Progress Notes (Signed)
Internal Medicine Clinic Attending ° °Case discussed with Dr. Lee at the time of the visit.  We reviewed the resident’s history and exam and pertinent patient test results.  I agree with the assessment, diagnosis, and plan of care documented in the resident’s note.  °

## 2019-05-11 ENCOUNTER — Other Ambulatory Visit: Payer: Self-pay

## 2019-05-11 ENCOUNTER — Encounter: Payer: Self-pay | Admitting: Podiatry

## 2019-05-11 ENCOUNTER — Ambulatory Visit: Payer: Medicaid Other | Admitting: Podiatry

## 2019-05-11 VITALS — Temp 97.5°F

## 2019-05-11 DIAGNOSIS — N186 End stage renal disease: Secondary | ICD-10-CM | POA: Diagnosis not present

## 2019-05-11 DIAGNOSIS — E119 Type 2 diabetes mellitus without complications: Secondary | ICD-10-CM | POA: Diagnosis not present

## 2019-05-11 DIAGNOSIS — Q828 Other specified congenital malformations of skin: Secondary | ICD-10-CM | POA: Diagnosis not present

## 2019-05-11 DIAGNOSIS — M79676 Pain in unspecified toe(s): Secondary | ICD-10-CM | POA: Diagnosis not present

## 2019-05-11 DIAGNOSIS — D689 Coagulation defect, unspecified: Secondary | ICD-10-CM | POA: Diagnosis not present

## 2019-05-11 DIAGNOSIS — B351 Tinea unguium: Secondary | ICD-10-CM

## 2019-05-11 NOTE — Progress Notes (Signed)
Patient ID: Perry Thomas, male   DOB: 02/21/1961, 58 y.o.   MRN: [id number] Complaint:  Visit Type: Patient returns to my office for continued preventative foot care services. Complaint: Patient states" my nails have grown long and thick and become painful to walk and wear shoes" Patient has been diagnosed with DM with no foot complications. Diet controlled. The patient presents for preventative foot care services. No changes to ROS.  Patient is taking plavix.  Podiatric Exam: Vascular: dorsalis pedis and posterior tibial pulses are palpable bilateral. Capillary return is immediate. Temperature gradient is WNL. Skin turgor WNL  Sensorium: Normal Semmes Weinstein monofilament test. Normal tactile sensation bilaterally. Nail Exam: Pt has thick disfigured discolored nails with subungual debris noted bilateral entire nail hallux through fifth toenails Ulcer Exam: There is no evidence of ulcer or pre-ulcerative changes or infection. Orthopedic Exam: Muscle tone and strength are WNL. No limitations in general ROM. No crepitus or effusions noted. Foot type and digits show no abnormalities. Bony prominences are unremarkable. Skin:  Porokeratosis sub 5 left.. No infection or ulcers  Diagnosis:  Onychomycosis, , Pain in right toe, pain in left toes  Treatment & Plan Procedures and Treatment: Consent by patient was obtained for treatment procedures. The patient understood the discussion of treatment and procedures well. All questions were answered thoroughly reviewed. Debridement of mycotic and hypertrophic toenails, 1 through 5 bilateral and clearing of subungual debris. No ulceration, no infection noted. Return Visit-Office Procedure: Patient instructed to return to the office for a follow up visit 10 weeks  for continued evaluation and treatment.    Gardiner Barefoot DPM

## 2019-05-12 ENCOUNTER — Ambulatory Visit: Payer: Medicaid Other | Admitting: Podiatry

## 2019-05-22 ENCOUNTER — Encounter: Payer: Medicaid Other | Admitting: Internal Medicine

## 2019-05-29 ENCOUNTER — Other Ambulatory Visit (HOSPITAL_COMMUNITY): Payer: Medicaid Other

## 2019-05-29 ENCOUNTER — Ambulatory Visit (HOSPITAL_COMMUNITY)
Admission: RE | Admit: 2019-05-29 | Discharge: 2019-05-29 | Disposition: A | Discharge status: Home / Self Care | Payer: Medicaid Other | Source: Ambulatory Visit | Attending: Family Medicine | Admitting: Family Medicine

## 2019-05-29 ENCOUNTER — Ambulatory Visit: Payer: Medicaid Other | Admitting: Internal Medicine

## 2019-05-29 ENCOUNTER — Encounter: Payer: Self-pay | Admitting: Internal Medicine

## 2019-05-29 ENCOUNTER — Other Ambulatory Visit: Payer: Self-pay

## 2019-05-29 VITALS — BP 126/72 | HR 90 | Temp 97.8°F | Ht 69.0 in | Wt 238.2 lb

## 2019-05-29 DIAGNOSIS — R0789 Other chest pain: Secondary | ICD-10-CM | POA: Insufficient documentation

## 2019-05-29 DIAGNOSIS — Z992 Dependence on renal dialysis: Secondary | ICD-10-CM

## 2019-05-29 DIAGNOSIS — N186 End stage renal disease: Secondary | ICD-10-CM

## 2019-05-29 DIAGNOSIS — E1122 Type 2 diabetes mellitus with diabetic chronic kidney disease: Secondary | ICD-10-CM | POA: Diagnosis not present

## 2019-05-29 DIAGNOSIS — I120 Hypertensive chronic kidney disease with stage 5 chronic kidney disease or end stage renal disease: Secondary | ICD-10-CM

## 2019-05-29 DIAGNOSIS — E1159 Type 2 diabetes mellitus with other circulatory complications: Secondary | ICD-10-CM

## 2019-05-29 DIAGNOSIS — Z7902 Long term (current) use of antithrombotics/antiplatelets: Secondary | ICD-10-CM

## 2019-05-29 DIAGNOSIS — I152 Hypertension secondary to endocrine disorders: Secondary | ICD-10-CM

## 2019-05-29 DIAGNOSIS — Z79899 Other long term (current) drug therapy: Secondary | ICD-10-CM | POA: Diagnosis not present

## 2019-05-29 DIAGNOSIS — E1151 Type 2 diabetes mellitus with diabetic peripheral angiopathy without gangrene: Secondary | ICD-10-CM | POA: Diagnosis not present

## 2019-05-29 DIAGNOSIS — I739 Peripheral vascular disease, unspecified: Secondary | ICD-10-CM

## 2019-05-29 LAB — GLUCOSE, CAPILLARY: Glucose-Capillary: 170 mg/dL — ABNORMAL HIGH (ref 70–99)

## 2019-05-29 LAB — POCT GLYCOSYLATED HEMOGLOBIN (HGB A1C): Hemoglobin A1C: 5.2 % (ref 4.0–5.6)

## 2019-05-29 MED ORDER — ROSUVASTATIN CALCIUM 20 MG PO TABS: 20.0000 mg | ORAL_TABLET | Freq: Every day | ORAL | 3 refills | Status: DC

## 2019-05-29 MED ORDER — CLOPIDOGREL BISULFATE 75 MG PO TABS: 75.0000 mg | ORAL_TABLET | Freq: Every day | ORAL | 3 refills | Status: DC

## 2019-05-29 NOTE — Progress Notes (Signed)
CC: Chest pain  HPI: Perry Thomas is a 58 y.o. M w/ PMH of ESRD, HTN, T2DM and PAD presenting to the clinic with complaint of chest pain. He describes 3/10 mid-sternal chest pain (stabbing sensation) without radiation exacerbated by eating food as well as palpation. This has been intermittent over the last 2-3 Weeks. He denies any exacerbation with exercise and states he did not take any medications for relif. He states he was recently seen at [facility] for an echocardiogram as part of his pre-transplant evaluation for his ESRD but was unable to mention the pain to his cardiologist as he did not meet him at this time. He states his pain feels musculoskeletal but he wanted to have a doctor evaluate just in case. Otherwise he has no acute complaints at this time and is requesting refills on his home medications.  Past Medical History:  Diagnosis Date  . Diabetes mellitus without complication ([place])   . ESRD on dialysis ([place])   . Hypertension   . PAD (peripheral artery disease) ([place])    Review of Systems: Review of Systems  Constitutional: Negative for chills, fever and malaise/fatigue.  Eyes: Negative for blurred vision.  Respiratory: Negative for shortness of breath.   Cardiovascular: Positive for chest pain. Negative for palpitations, orthopnea and leg swelling.  Gastrointestinal: Negative for diarrhea, nausea and vomiting.     Physical Exam: Vitals:   05/29/19 1518 05/29/19 1548  BP: (!) 147/69 126/72  Pulse: 90   Temp: 97.8 F (36.6 C)   TempSrc: Oral   SpO2: 99%   Weight: 238 lb 3.2 oz (108 kg)   Height: 5\' 9"  (1.753 m)     Physical Exam  Constitutional: He is oriented to person, place, and time. He appears well-developed and well-nourished.  HENT:  Mouth/Throat: Oropharynx is clear and moist.  Neck: Normal range of motion. Neck supple.  Cardiovascular: Normal rate, regular rhythm, normal heart sounds and intact distal pulses.  No murmur heard. Left arm AVF w/ good  bruit  Respiratory: Effort normal and breath sounds normal. He has no wheezes. He has no rales. He exhibits tenderness (mid-sternal chest wall tenderness to palpation).  GI: Soft. Bowel sounds are normal. There is no abdominal tenderness.  Musculoskeletal: Normal range of motion.        General: No edema.  Neurological: He is alert and oriented to person, place, and time.  Lower extremity peripheral sensation to light touch intact  Skin: Skin is warm and dry.    Assessment & Plan:   Atypical chest pain Presents with atypical chest pain. 0/3 on diamond classification for anginal pain. More likely to be musculoskeletal or reflux disease. Tenderness to palpation on exam. Low suspicion for anginal pain especially in light of recent echocardiogram at [facility] showing no wall motion abnormality. However he has multiple risk factors including peripheral artery disease, hx of diabetes and ESRD. Will check an EKG this visit.  - EKG today - personally reviewed normal sinus, left axis deviation, single peaked T in V2 lead but no other ischemic changes. No prior EKG to compare but no STEMI.  - Recommend Tylenol for musculoskeletal pain  ESRD (end stage renal disease) on dialysis [facility]) Continuing to attend dialysis MWF without difficulty. Progressing with pre-transplant work-up for [facility] with recent cardiac work-up including echocardiogram. AVF with good bruit on exam today.  - C/w dialysis and f/u with [facility] transplant  Peripheral vascular disease ([place]) Pt requires refills on medications with associated diagnosis  above.  Reviewed disease process and find this medication to be necessary, will not change dose or alter current therapy. Denies any peripheral pain or weakness.  - C/w plavix 75mg  daily, rosuvastatin 20mg  daily  Type 2 diabetes mellitus ([place]) Lab Results  Component Value Date   HGBA1C 5.2 05/29/2019   Currently not on diabetic medications. He states after his ESRD, he has  not required any further medications for diabetes. Diabetic foot exam performed today without abnormal findings. Denies polyuria, polyphagia, polydipsia. Will change to annual hemoglobin a1c checks.  Hypertension associated with diabetes ([place]) BP Readings from Last 3 Encounters:  05/29/19 126/72  01/08/19 126/66  09/20/18 114/63   At goal while on dialysis. Not on any anti-hypertensive meds. Denies any headache, blurry vision, palpitations.  - C/w monitor    Patient discussed with Dr. Angelia Mould   -Gilberto Better, PGY1

## 2019-05-29 NOTE — Patient Instructions (Signed)
Thank you for allowing [place] to provide your care today. Today we discussed your chest pain    I have ordered hemoglobin a1c labs for you. I will call if any are abnormal.    Today we made no changes to your medications.    Please follow-up in 12 months.    Should you have any questions or concerns please call the internal medicine clinic at [phone].     Chest Wall Pain Chest wall pain is pain in or around the bones and muscles of your chest. Chest wall pain may be caused by:  An injury.  Coughing a lot.  Using your chest and arm muscles too much. Sometimes, the cause may not be known. This pain may take a few weeks or longer to get better. Follow these instructions at home: Managing pain, stiffness, and swelling If told, put ice on the painful area:  Put ice in a plastic bag.  Place a towel between your skin and the bag.  Leave the ice on for 20 minutes, 2-3 times a day.  Activity  Rest as told by your doctor.  Avoid doing things that cause pain. This includes lifting heavy items.  Ask your doctor what activities are safe for you. General instructions   Take over-the-counter and prescription medicines only as told by your doctor.  Do not use any products that contain nicotine or tobacco, such as cigarettes, e-cigarettes, and chewing tobacco. If you need help quitting, ask your doctor.  Keep all follow-up visits as told by your doctor. This is important. Contact a doctor if:  You have a fever.  Your chest pain gets worse.  You have new symptoms. Get help right away if:  You feel sick to your stomach (nauseous) or you throw up (vomit).  You feel sweaty or light-headed.  You have a cough with mucus from your lungs (sputum) or you cough up blood.  You are short of breath. These symptoms may be an emergency. Do not wait to see if the symptoms will go away. Get medical help right away. Call your local emergency services (911 in the [place] Do not drive  yourself to the hospital. Summary  Chest wall pain is pain in or around the bones and muscles of your chest.  It may be treated with ice, rest, and medicines. Your condition may also get better if you avoid doing things that cause pain.  Contact a doctor if you have a fever, chest pain that gets worse, or new symptoms.  Get help right away if you feel light-headed or you get short of breath. These symptoms may be an emergency. This information is not intended to replace advice given to you by your health care provider. Make sure you discuss any questions you have with your health care provider. Document Released: 05/17/2008 Document Revised: 06/01/2018 Document Reviewed: 06/01/2018 Elsevier Interactive Patient Education  2019 [employer].

## 2019-05-30 DIAGNOSIS — R0789 Other chest pain: Secondary | ICD-10-CM | POA: Insufficient documentation

## 2019-05-30 HISTORY — DX: Other chest pain: R07.89

## 2019-05-30 NOTE — Assessment & Plan Note (Signed)
Pt requires refills on medications with associated diagnosis above.  Reviewed disease process and find this medication to be necessary, will not change dose or alter current therapy. Denies any peripheral pain or weakness.  - C/w plavix 75mg  daily, rosuvastatin 20mg  daily

## 2019-05-30 NOTE — Assessment & Plan Note (Signed)
Continuing to attend dialysis MWF without difficulty. Progressing with pre-transplant work-up for [facility] with recent cardiac work-up including echocardiogram. AVF with good bruit on exam today.  - C/w dialysis and f/u with [facility] transplant

## 2019-05-30 NOTE — Assessment & Plan Note (Signed)
Presents with atypical chest pain. 0/3 on diamond classification for anginal pain. More likely to be musculoskeletal or reflux disease. Tenderness to palpation on exam. Low suspicion for anginal pain especially in light of recent echocardiogram at [facility] showing no wall motion abnormality. However he has multiple risk factors including peripheral artery disease, hx of diabetes and ESRD. Will check an EKG this visit.  - EKG today - personally reviewed normal sinus, left axis deviation, single peaked T in V2 lead but no other ischemic changes. No prior EKG to compare but no STEMI.  - Recommend Tylenol for musculoskeletal pain

## 2019-05-31 ENCOUNTER — Encounter: Payer: Self-pay | Admitting: Internal Medicine

## 2019-05-31 NOTE — Assessment & Plan Note (Signed)
Lab Results  Component Value Date   HGBA1C 5.2 05/29/2019   Currently not on diabetic medications. He states after his ESRD, he has not required any further medications for diabetes. Diabetic foot exam performed today without abnormal findings. Denies polyuria, polyphagia, polydipsia. Will change to annual hemoglobin a1c checks.

## 2019-05-31 NOTE — Assessment & Plan Note (Addendum)
BP Readings from Last 3 Encounters:  05/29/19 126/72  01/08/19 126/66  09/20/18 114/63   At goal while on dialysis. Not on any anti-hypertensive meds. Denies any headache, blurry vision, palpitations.  - C/w monitor

## 2019-06-04 NOTE — Progress Notes (Signed)
Internal Medicine Clinic Attending ° °Case discussed with Dr. Lee at the time of the visit.  We reviewed the resident’s history and exam and pertinent patient test results.  I agree with the assessment, diagnosis, and plan of care documented in the resident’s note.  °

## 2019-06-07 ENCOUNTER — Other Ambulatory Visit: Payer: Self-pay

## 2019-06-07 ENCOUNTER — Encounter: Payer: Self-pay | Admitting: Internal Medicine

## 2019-06-07 ENCOUNTER — Telehealth: Payer: Self-pay | Admitting: Internal Medicine

## 2019-06-07 ENCOUNTER — Ambulatory Visit (INDEPENDENT_AMBULATORY_CARE_PROVIDER_SITE_OTHER): Payer: Medicaid Other | Admitting: Internal Medicine

## 2019-06-07 DIAGNOSIS — Z20828 Contact with and (suspected) exposure to other viral communicable diseases: Secondary | ICD-10-CM | POA: Diagnosis not present

## 2019-06-07 DIAGNOSIS — Z20822 Contact with and (suspected) exposure to covid-19: Secondary | ICD-10-CM

## 2019-06-07 NOTE — Telephone Encounter (Signed)
NURSING TRIAGE NOTE FOR RESPIRATORY SYMPTOMS Pt states he spoke w/ a friend sat for appr. 78mins. Pt had on mask, friend did not have a mask on, pt had mask in place. Friend is a truck driver and stated his driving partner tested +positive+, friend was tested Sunday and it is +positive+. Pt is worried that he may have COVID now, wants to be tested  Do you have a fever?"no"   Do you have a cough? "no"  Do you have shortness of breath more than normal? "no"  Do you have chest pain?"no"  Are you able to eat and drink normally? "yes"  Have you seen a physician for these symptoms?"no"  Pt is on dialysis and has not informed the staff, he will do so now    Action  Keep hands clean, surfaces clean Do not go out, if you must wear a mask Drink as much as you are allowed by your kidney md Rest as much as possible Will call with advice from md  Sending to pcp, attending  I informed patient to expect a phone call from a physician soon and I sent request to front desk to schedule a virtual office appt for patient and arrive the patient.

## 2019-06-07 NOTE — Assessment & Plan Note (Signed)
Perry Thomas was talking to a friend 2 days ago that found out he tested positive for COVID-19 this morning. Patient was wearing a mask at the time, but his friend was not.  Patient is on dialysis and would like to be tested. He is currently asymptomatic.  Information provided for community testing center. He endorsed understanding and will call the provided number to set up an appointment.

## 2019-06-07 NOTE — Telephone Encounter (Signed)
Agree with telehealth visit.  Thank you.

## 2019-06-07 NOTE — Telephone Encounter (Signed)
Patient has been around friend who tested positive on Monday and would like a nurse to call back.

## 2019-06-07 NOTE — Progress Notes (Signed)
  [facility] Telephone Encounter Continuity Care Appointment  HPI:   This telephone encounter was created for [name] on 06/07/2019 for the following purpose/cc concern for COVID-19 exposure.   Past Medical History:  Past Medical History:  Diagnosis Date  . Diabetes mellitus without complication ([place])   . ESRD on dialysis ([place])   . Hypertension   . Hypertension associated with diabetes ([place]) 01/30/2016  . PAD (peripheral artery disease) ([place])   . Type 2 diabetes mellitus ([place]) 01/30/2016      ROS:   No fevers, chills, cough, chest pain, shortness of breath, abdominal pain, n/v, diarrhea.    Assessment / Plan / Recommendations:   Please see A&P under problem oriented charting for assessment of the patient's acute and chronic medical conditions.   As always, pt is advised that if symptoms worsen or new symptoms arise, they should go to an urgent care facility or to to ER for further evaluation.   Consent and Medical Decision Making:   Patient discussed with Dr. Angelia Mould  This is a telephone encounter between Perry Thomas and Perry Thomas on 06/07/2019 for possible COVID-19 exposure. The visit was conducted with the patient located at home and Perry Thomas at [facility]. The patient's identity was confirmed using their DOB and current address. The patient has consented to being evaluated through a telephone encounter and understands the associated risks (an examination cannot be done and the patient may need to come in for an appointment) / benefits (allows the patient to remain at home, decreasing exposure to coronavirus). I personally spent 10 minutes on medical discussion.

## 2019-06-08 NOTE — Progress Notes (Signed)
Internal Medicine Clinic Attending  Case discussed with Dr. Koleen Distance soon after the resident saw the patient.  We reviewed the resident's history and exam and pertinent patient test results.  I agree with the assessment, diagnosis, and plan of care documented in the resident's note.

## 2019-07-24 ENCOUNTER — Ambulatory Visit: Payer: Medicaid Other | Admitting: Podiatry

## 2019-10-26 ENCOUNTER — Other Ambulatory Visit: Payer: Self-pay

## 2019-10-26 ENCOUNTER — Ambulatory Visit (INDEPENDENT_AMBULATORY_CARE_PROVIDER_SITE_OTHER): Payer: Medicaid Other | Admitting: Podiatry

## 2019-10-26 ENCOUNTER — Encounter: Payer: Self-pay | Admitting: Podiatry

## 2019-10-26 DIAGNOSIS — B351 Tinea unguium: Secondary | ICD-10-CM

## 2019-10-26 DIAGNOSIS — E119 Type 2 diabetes mellitus without complications: Secondary | ICD-10-CM

## 2019-10-26 DIAGNOSIS — Q828 Other specified congenital malformations of skin: Secondary | ICD-10-CM

## 2019-10-26 DIAGNOSIS — M79676 Pain in unspecified toe(s): Secondary | ICD-10-CM

## 2019-10-26 DIAGNOSIS — D689 Coagulation defect, unspecified: Secondary | ICD-10-CM

## 2019-10-26 NOTE — Progress Notes (Signed)
Patient ID: Perry Thomas, male   DOB: 1961/03/14, 58 y.o.   MRN: [id number] Complaint:  Visit Type: Patient returns to my office for continued preventative foot care services. Complaint: Patient states" my nails have grown long and thick and become painful to walk and wear shoes" Patient has been diagnosed with DM with no foot complications. Diet controlled. The patient presents for preventative foot care services. No changes to ROS.  Patient is taking plavix.  Podiatric Exam: Vascular: dorsalis pedis and posterior tibial pulses are palpable bilateral. Capillary return is immediate. Temperature gradient is WNL. Skin turgor WNL  Sensorium: Normal Semmes Weinstein monofilament test. Normal tactile sensation bilaterally. Nail Exam: Pt has thick disfigured discolored nails with subungual debris noted bilateral entire nail hallux through fifth toenails Ulcer Exam: There is no evidence of ulcer or pre-ulcerative changes or infection. Orthopedic Exam: Muscle tone and strength are WNL. No limitations in general ROM. No crepitus or effusions noted. Foot type and digits show no abnormalities. Bony prominences are unremarkable. Skin:  Porokeratosis sub 5 left.. No infection or ulcers  Diagnosis:  Onychomycosis, , Pain in right toe, pain in left toes  Debride callus left sub 5th left  Treatment & Plan Procedures and Treatment: Consent by patient was obtained for treatment procedures. The patient understood the discussion of treatment and procedures well. All questions were answered thoroughly reviewed. Debridement of mycotic and hypertrophic toenails, 1 through 5 bilateral and clearing of subungual debris. No ulceration, no infection noted. Debride callus Return Visit-Office Procedure: Patient instructed to return to the office for a follow up visit 10 weeks  for continued evaluation and treatment.    Gardiner Barefoot DPM

## 2020-01-04 ENCOUNTER — Other Ambulatory Visit: Payer: Self-pay

## 2020-01-04 ENCOUNTER — Encounter: Payer: Self-pay | Admitting: Podiatry

## 2020-01-04 ENCOUNTER — Ambulatory Visit: Payer: Medicaid Other | Admitting: Podiatry

## 2020-01-04 DIAGNOSIS — B351 Tinea unguium: Secondary | ICD-10-CM | POA: Diagnosis not present

## 2020-01-04 DIAGNOSIS — D689 Coagulation defect, unspecified: Secondary | ICD-10-CM

## 2020-01-04 DIAGNOSIS — M79676 Pain in unspecified toe(s): Secondary | ICD-10-CM | POA: Diagnosis not present

## 2020-01-04 DIAGNOSIS — E119 Type 2 diabetes mellitus without complications: Secondary | ICD-10-CM

## 2020-01-04 DIAGNOSIS — Q828 Other specified congenital malformations of skin: Secondary | ICD-10-CM

## 2020-01-04 NOTE — Progress Notes (Signed)
Patient ID: Perry Thomas, male   DOB: Sep 27, 1961, 59 y.o.   MRN: [id number] Complaint:  Visit Type: Patient returns to my office for continued preventative foot care services. Complaint: Patient states" my nails have grown long and thick and become painful to walk and wear shoes" Patient has been diagnosed with DM with no foot complications. Diet controlled. The patient presents for preventative foot care services. No changes to ROS.  Patient is taking plavix.  Podiatric Exam: Vascular: dorsalis pedis and posterior tibial pulses are palpable bilateral. Capillary return is immediate. Temperature gradient is WNL. Skin turgor WNL  Sensorium: Normal Semmes Weinstein monofilament test. Normal tactile sensation bilaterally. Nail Exam: Pt has thick disfigured discolored nails with subungual debris noted bilateral entire nail hallux through fifth toenails Ulcer Exam: There is no evidence of ulcer or pre-ulcerative changes or infection. Orthopedic Exam: Muscle tone and strength are WNL. No limitations in general ROM. No crepitus or effusions noted. Foot type and digits show no abnormalities. Bony prominences are unremarkable. Skin:  Porokeratosis sub 5 left.. No infection or ulcers  Diagnosis:  Onychomycosis, , Pain in right toe, pain in left toes  Debride callus left sub 5th left  Treatment & Plan Procedures and Treatment: Consent by patient was obtained for treatment procedures. The patient understood the discussion of treatment and procedures well. All questions were answered thoroughly reviewed. Debridement of mycotic and hypertrophic toenails, 1 through 5 bilateral and clearing of subungual debris. No ulceration, no infection noted. Debride callus Return Visit-Office Procedure: Patient instructed to return to the office for a follow up visit 10 weeks  for continued evaluation and treatment.    Gardiner Barefoot DPM

## 2020-03-18 ENCOUNTER — Ambulatory Visit: Payer: Medicaid Other | Admitting: Podiatry

## 2020-03-18 ENCOUNTER — Other Ambulatory Visit: Payer: Self-pay

## 2020-03-18 ENCOUNTER — Encounter: Payer: Self-pay | Admitting: Podiatry

## 2020-03-18 VITALS — Temp 95.7°F

## 2020-03-18 DIAGNOSIS — B351 Tinea unguium: Secondary | ICD-10-CM

## 2020-03-18 DIAGNOSIS — D689 Coagulation defect, unspecified: Secondary | ICD-10-CM

## 2020-03-18 DIAGNOSIS — Q828 Other specified congenital malformations of skin: Secondary | ICD-10-CM

## 2020-03-18 DIAGNOSIS — E119 Type 2 diabetes mellitus without complications: Secondary | ICD-10-CM

## 2020-03-18 DIAGNOSIS — N186 End stage renal disease: Secondary | ICD-10-CM

## 2020-03-18 DIAGNOSIS — M79676 Pain in unspecified toe(s): Secondary | ICD-10-CM

## 2020-03-18 NOTE — Progress Notes (Signed)
Patient ID: Perry Thomas, male   DOB: 24-May-1961, 59 y.o.   MRN: [id number] Complaint:  Visit Type: Patient returns to my office for continued preventative foot care services. Complaint: Patient states" my nails have grown long and thick and become painful to walk and wear shoes" Patient has been diagnosed with DM with no foot complications.  Patient has history of kidney transplant.. The patient presents for preventative foot care services. No changes to ROS.  Patient is taking plavix whci causes coagulation defect.    Podiatric Exam: Vascular: dorsalis pedis and posterior tibial pulses are palpable bilateral. Capillary return is immediate. Temperature gradient is WNL. Skin turgor WNL  Sensorium: Normal Semmes Weinstein monofilament test. Normal tactile sensation bilaterally. Nail Exam: Pt has thick disfigured discolored nails with subungual debris noted bilateral entire nail hallux through fifth toenails Ulcer Exam: There is no evidence of ulcer or pre-ulcerative changes or infection. Orthopedic Exam: Muscle tone and strength are WNL. No limitations in general ROM. No crepitus or effusions noted. Foot type and digits show no abnormalities. Bony prominences are unremarkable. Skin:  Porokeratosis sub 5 left.. No infection or ulcers  Diagnosis:  Onychomycosis, , Pain in right toe, pain in left toes  Debride callus left sub 5th left  Treatment & Plan Procedures and Treatment: Consent by patient was obtained for treatment procedures. The patient understood the discussion of treatment and procedures well. All questions were answered thoroughly reviewed. Debridement of mycotic and hypertrophic toenails, 1 through 5 bilateral and clearing of subungual debris. No ulceration, no infection noted. Debride callus using # 15 blade. Return Visit-Office Procedure: Patient instructed to return to the office for a follow up visit 10 weeks  for continued evaluation and treatment.    Gardiner Barefoot DPM

## 2020-05-28 ENCOUNTER — Other Ambulatory Visit: Payer: Self-pay

## 2020-05-28 ENCOUNTER — Encounter: Payer: Self-pay | Admitting: Podiatry

## 2020-05-28 ENCOUNTER — Ambulatory Visit: Payer: Medicaid Other | Admitting: Podiatry

## 2020-05-28 DIAGNOSIS — N186 End stage renal disease: Secondary | ICD-10-CM

## 2020-05-28 DIAGNOSIS — E119 Type 2 diabetes mellitus without complications: Secondary | ICD-10-CM | POA: Diagnosis not present

## 2020-05-28 DIAGNOSIS — B351 Tinea unguium: Secondary | ICD-10-CM

## 2020-05-28 DIAGNOSIS — D689 Coagulation defect, unspecified: Secondary | ICD-10-CM

## 2020-05-28 DIAGNOSIS — Q828 Other specified congenital malformations of skin: Secondary | ICD-10-CM

## 2020-05-28 DIAGNOSIS — M79676 Pain in unspecified toe(s): Secondary | ICD-10-CM

## 2020-05-28 NOTE — Progress Notes (Signed)
Patient ID: Perry Thomas, male   DOB: 1961-07-03, 59 y.o.   MRN: [id number] Complaint:  Visit Type: Patient returns to my office for continued preventative foot care services. Complaint: Patient states" my nails have grown long and thick and become painful to walk and wear shoes" Patient has been diagnosed with DM with no foot complications.  Patient has history of kidney transplant.. The patient presents for preventative foot care services. No changes to ROS.  Patient is taking plavix which causes coagulation defect.    Podiatric Exam: Vascular: dorsalis pedis and posterior tibial pulses are palpable bilateral. Capillary return is immediate. Temperature gradient is WNL. Skin turgor WNL  Sensorium: Normal Semmes Weinstein monofilament test. Normal tactile sensation bilaterally. Nail Exam: Pt has thick disfigured discolored nails with subungual debris noted bilateral entire nail hallux through fifth toenails Ulcer Exam: There is no evidence of ulcer or pre-ulcerative changes or infection. Orthopedic Exam: Muscle tone and strength are WNL. No limitations in general ROM. No crepitus or effusions noted. Foot type and digits show no abnormalities. Bony prominences are unremarkable. Skin:  Porokeratosis sub 5 left.. No infection or ulcers  Diagnosis:  Onychomycosis, , Pain in right toe, pain in left toes  Debride callus left sub 5th left  Treatment & Plan Procedures and Treatment: Consent by patient was obtained for treatment procedures. The patient understood the discussion of treatment and procedures well. All questions were answered thoroughly reviewed. Debridement of mycotic and hypertrophic toenails, 1 through 5 bilateral and clearing of subungual debris. No ulceration, no infection noted. Debride callus using # 15 blade. Return Visit-Office Procedure: Patient instructed to return to the office for a follow up visit 10 weeks  for continued evaluation and treatment.    Gardiner Barefoot DPM

## 2020-05-31 ENCOUNTER — Emergency Department (HOSPITAL_COMMUNITY)
Admission: EM | Admit: 2020-05-31 | Discharge: 2020-05-31 | Disposition: A | Discharge status: Home / Self Care | Payer: Medicaid Other | Attending: Emergency Medicine | Admitting: Emergency Medicine

## 2020-05-31 ENCOUNTER — Encounter (HOSPITAL_COMMUNITY): Payer: Self-pay | Admitting: Emergency Medicine

## 2020-05-31 ENCOUNTER — Other Ambulatory Visit: Payer: Self-pay

## 2020-05-31 DIAGNOSIS — R339 Retention of urine, unspecified: Secondary | ICD-10-CM | POA: Diagnosis present

## 2020-05-31 DIAGNOSIS — N9989 Other postprocedural complications and disorders of genitourinary system: Secondary | ICD-10-CM | POA: Diagnosis not present

## 2020-05-31 DIAGNOSIS — T4145XA Adverse effect of unspecified anesthetic, initial encounter: Secondary | ICD-10-CM | POA: Diagnosis not present

## 2020-05-31 DIAGNOSIS — Z7901 Long term (current) use of anticoagulants: Secondary | ICD-10-CM | POA: Insufficient documentation

## 2020-05-31 DIAGNOSIS — E1122 Type 2 diabetes mellitus with diabetic chronic kidney disease: Secondary | ICD-10-CM | POA: Diagnosis not present

## 2020-05-31 DIAGNOSIS — Z794 Long term (current) use of insulin: Secondary | ICD-10-CM | POA: Diagnosis not present

## 2020-05-31 DIAGNOSIS — Z79899 Other long term (current) drug therapy: Secondary | ICD-10-CM | POA: Diagnosis not present

## 2020-05-31 DIAGNOSIS — Z87891 Personal history of nicotine dependence: Secondary | ICD-10-CM | POA: Insufficient documentation

## 2020-05-31 DIAGNOSIS — Z992 Dependence on renal dialysis: Secondary | ICD-10-CM | POA: Insufficient documentation

## 2020-05-31 DIAGNOSIS — I120 Hypertensive chronic kidney disease with stage 5 chronic kidney disease or end stage renal disease: Secondary | ICD-10-CM | POA: Diagnosis not present

## 2020-05-31 DIAGNOSIS — N186 End stage renal disease: Secondary | ICD-10-CM | POA: Insufficient documentation

## 2020-05-31 DIAGNOSIS — T8859XA Other complications of anesthesia, initial encounter: Secondary | ICD-10-CM

## 2020-05-31 DIAGNOSIS — R338 Other retention of urine: Secondary | ICD-10-CM | POA: Diagnosis not present

## 2020-05-31 LAB — URINALYSIS, ROUTINE W REFLEX MICROSCOPIC
Bilirubin Urine: NEGATIVE
Glucose, UA: NEGATIVE mg/dL
Hgb urine dipstick: NEGATIVE
Ketones, ur: NEGATIVE mg/dL
Leukocytes,Ua: NEGATIVE
Nitrite: NEGATIVE
Protein, ur: NEGATIVE mg/dL
Specific Gravity, Urine: 1.013 (ref 1.005–1.030)
pH: 6 (ref 5.0–8.0)

## 2020-05-31 NOTE — ED Provider Notes (Signed)
[facility] EMERGENCY DEPARTMENT Provider Note   CSN: [id number] Arrival date & time: 05/31/20  0056     History Chief Complaint  Patient presents with  . Dysuria    Perry Thomas is a 59 y.o. male.  59 yo M with a cc of urinary retention.  Patient states that he had an endoscopic sinus procedure that was done yesterday and since then has been having difficulty urinating.  He states that he is able to bear down and release repetitively and is able to get some output from his bladder.  He thinks this is actually improved over the time that he has been here.  Has been able to pee a bit more freely.  Denies fevers or flank pain.   Dysuria Presenting symptoms: no dysuria, no penile discharge and no penile pain   Context comment:  After surgery Relieved by:  Nothing Worsened by:  Nothing Ineffective treatments:  None tried Associated symptoms: no abdominal pain, no diarrhea, no fever, no flank pain, no penile swelling, no scrotal swelling and no vomiting        Past Medical History:  Diagnosis Date  . Diabetes mellitus without complication ([place])   . ESRD on dialysis ([place])   . Hypertension   . Hypertension associated with diabetes ([place]) 01/30/2016  . PAD (peripheral artery disease) ([place])   . Type 2 diabetes mellitus ([place]) 01/30/2016    Patient Active Problem List   Diagnosis Date Noted  . Exposure to COVID-19 virus 06/07/2019  . Atypical chest pain 05/30/2019  . Erectile dysfunction 05/03/2018  . Elevated PSA, less than 10 ng/ml 03/31/2017  . Rhinitis, allergic 07/14/2016  . Healthcare maintenance 03/12/2016  . ESRD (end stage renal disease) on dialysis ([place]) 01/30/2016  . Peripheral vascular disease ([place]) 01/30/2016    Past Surgical History:  Procedure Laterality Date  . PTCA     peripheral artery disease       Family History  Problem Relation Age of Onset  . Healthy Mother   . Liver cancer Father   . Kidney disease Sister     Social  History   Tobacco Use  . Smoking status: Former Smoker    Quit date: 12/13/2009    Years since quitting: 10.4  . Smokeless tobacco: Never Used  Vaping Use  . Vaping Use: Never used  Substance Use Topics  . Alcohol use: No    Alcohol/week: 0.0 standard drinks  . Drug use: No    Home Medications Prior to Admission medications   Medication Sig Start Date End Date Taking? Authorizing Provider  amLODipine (NORVASC) 10 MG tablet Take by mouth. 08/06/19   [provider]  aspirin 81 MG EC tablet Take by mouth. 09/05/19   [provider]  carvedilol (COREG) 12.5 MG tablet Take by mouth. 03/06/20   [provider]  clopidogrel (PLAVIX) 75 MG tablet Take 1 tablet (75 mg total) by mouth daily. 05/29/19   Mosetta Anis, MD  Continuous Blood Gluc Sensor (DEXCOM G6 SENSOR) MISC Apply 1 sensor to the skin every 10 days for continuous glucose monitoring. 05/15/20   [provider]  Continuous Blood Gluc Transmit (DEXCOM G6 TRANSMITTER) MISC USE AS DIRECTED FOR CONTINUOUS GLUCOSE MONITORING. REUSE TRANSMITTER FOR 90 DAYS THEN DISCARD AND REPLACE. 04/28/20   [provider]  DUREZOL 0.05 % EMUL INSTILL 1 DROP INTO LEFT EYE 2 TIMES WEEKLY 09/26/18   [provider]  Exenatide ER 2 MG/0.85ML AUIJ Inject into the skin. 05/15/20  [provider]  furosemide (LASIX) 80 MG tablet Take 1/2 tablet (40 mg) daily as needed; 03/17/20   [provider]  insulin glargine (LANTUS SOLOSTAR) 100 UNIT/ML Solostar Pen Inject into the skin. 05/15/20   [provider]  insulin lispro (HUMALOG) 100 UNIT/ML KwikPen Take 6 units with breakfast, 5 units with lunch and dinner 08/29/19   [provider]  Lancets (ONETOUCH ULTRASOFT) lancets Use to check blood sugar before breakfast and before supper. E11.65 05/15/20   [provider]  mycophenolate (MYFORTIC) 180 MG EC tablet Take by mouth. 12/04/19   [provider]  pantoprazole  (PROTONIX) 40 MG tablet Take by mouth. 01/29/20   [provider]  prednisoLONE acetate (PRED FORTE) 1 % ophthalmic suspension Apply 3 drops to each nostril twice daily through 03/13/20 then decrease use to once daily. 03/14/20   [provider]  predniSONE (DELTASONE) 5 MG tablet Take by mouth. 04/03/20   [provider]  [place] 1-0.2 % SUSP INSTILL 1 DROP INTO LEFT EYE TWICE A DAY 09/06/17   [provider]  tacrolimus (PROGRAF) 1 MG capsule Take by mouth. 05/01/20   [provider]    Allergies    Patient has no known allergies.  Review of Systems   Review of Systems  Constitutional: Negative for chills and fever.  HENT: Negative for congestion and facial swelling.   Eyes: Negative for discharge and visual disturbance.  Respiratory: Negative for shortness of breath.   Cardiovascular: Negative for chest pain and palpitations.  Gastrointestinal: Negative for abdominal pain, diarrhea and vomiting.  Genitourinary: Positive for difficulty urinating. Negative for discharge, dysuria, flank pain, penile pain, penile swelling, scrotal swelling and testicular pain.  Musculoskeletal: Negative for arthralgias and myalgias.  Skin: Negative for color change and rash.  Neurological: Negative for tremors, syncope and headaches.  Psychiatric/Behavioral: Negative for confusion and dysphoric mood.    Physical Exam Updated Vital Signs BP 128/72 (BP Location: Right Arm)   Pulse 80   Temp 97.8 F (36.6 C) (Oral)   Resp 18   SpO2 99%   Physical Exam Vitals and nursing note reviewed.  Constitutional:      Appearance: He is well-developed.  HENT:     Head: Normocephalic and atraumatic.     Comments: Packing to his nose bilaterally Eyes:     Pupils: Pupils are equal, round, and reactive to light.  Neck:     Vascular: No JVD.  Cardiovascular:     Rate and Rhythm: Normal rate and regular rhythm.     Heart sounds: No murmur heard.  No friction rub. No  gallop.   Pulmonary:     Effort: No respiratory distress.     Breath sounds: No wheezing.  Abdominal:     General: There is no distension.     Tenderness: There is no abdominal tenderness. There is no guarding or rebound.  Musculoskeletal:        General: Normal range of motion.     Cervical back: Normal range of motion and neck supple.  Skin:    Coloration: Skin is not pale.     Findings: No rash.  Neurological:     Mental Status: He is alert and oriented to person, place, and time.  Psychiatric:        Behavior: Behavior normal.     ED Results / Procedures / Treatments   Labs (all labs ordered are listed, but only abnormal results are displayed) Labs Reviewed  URINALYSIS, ROUTINE W  REFLEX MICROSCOPIC    EKG None  Radiology No results found.  Procedures Procedures (including critical care time) Emergency Focused Ultrasound Exam Limited ultrasound of the Bladder.   Performed and interpreted by Dr. Tyrone Nine Indication: evaluation for urinary retention Transverse and Sagittal views of the bladder are obtained and calculations are performed to determine an estimated bladder volume for signs of post-renal obstruction.  Findings: Bladder is 270 ccfull Interpretation: with evidence of outlet obstruction Images archived electronically.  CPT Codes: [id number] and [phone]  Emergency Focused Ultrasound Exam Limited ultrasound of the Bladder.   Performed and interpreted by Dr. Tyrone Nine Indication: evaluation for urinary retention Transverse and Sagittal views of the bladder are obtained and calculations are performed to determine an estimated bladder volume for signs of post-renal obstruction.  Findings: Bladder is 175cc full Interpretation: no evidence of outlet obstruction Images archived electronically.  CPT Codes: [phone] and [phone]   Medications Ordered in ED Medications - No data to display  ED Course  I have reviewed the triage vital signs and the nursing  notes.  Pertinent labs & imaging results that were available during my care of the patient were reviewed by me and considered in my medical decision making (see chart for details).    MDM Rules/Calculators/A&P                          59 yo M with a chief complaint of urinary retention after having surgery yesterday.  Probably a side effect of anesthesia.  He has had progressive improvement since then.  Will obtain a bladder scan here.  UA is negative for infection.  Bedside ultrasound with greater than 200 cc of urine in the bladder.  The patient does feel like he has to urinate and so he went to the bathroom and came back and now has less than 200 cc.  Since his symptoms have progressively improved I suspect this will likely continue to improve.  We will have him follow-up with his PCP ear nose and throat doctor.  9:15 AM:  I have discussed the diagnosis/risks/treatment options with the patient and believe the pt to be eligible for discharge home to follow-up with PCP. We also discussed returning to the ED immediately if new or worsening sx occur. We discussed the sx which are most concerning (e.g., worsening difficulty urinating, abdominal pain) that necessitate immediate return. Medications administered to the patient during their visit and any new prescriptions provided to the patient are listed below.  Medications given during this visit Medications - No data to display   The patient appears reasonably screen and/or stabilized for discharge and I doubt any other medical condition or other [facility] requiring further screening, evaluation, or treatment in the ED at this time prior to discharge.   Final Clinical Impression(s) / ED Diagnoses Final diagnoses:  Complication of anesthesia, initial encounter  Postoperative urinary retention    Rx / DC Orders ED Discharge Orders    None       Deno Etienne, DO 05/31/20 0915

## 2020-05-31 NOTE — Discharge Instructions (Signed)
Most likely this is a reaction to the anesthesia and should progressively get better.  Please return if you feel that you are unable to urinate or your symptoms worsen or if you have lower abdominal pain.  Please follow-up with your family doctor and your ear nose and throat doctor.

## 2020-05-31 NOTE — ED Triage Notes (Signed)
Pt presents to ED POv. Pt c/o of dysuria. Pt state he is unable to urinate completely since his sinus surgery today. Called oncall MD and told to come here.Pt had kidney transplant 1y ago.

## 2020-08-25 ENCOUNTER — Other Ambulatory Visit: Payer: Self-pay

## 2020-08-25 ENCOUNTER — Encounter (INDEPENDENT_AMBULATORY_CARE_PROVIDER_SITE_OTHER): Payer: Self-pay | Admitting: Ophthalmology

## 2020-08-25 ENCOUNTER — Ambulatory Visit (INDEPENDENT_AMBULATORY_CARE_PROVIDER_SITE_OTHER): Payer: Medicaid Other | Admitting: Ophthalmology

## 2020-08-25 DIAGNOSIS — H2511 Age-related nuclear cataract, right eye: Secondary | ICD-10-CM

## 2020-08-25 DIAGNOSIS — E113551 Type 2 diabetes mellitus with stable proliferative diabetic retinopathy, right eye: Secondary | ICD-10-CM

## 2020-08-25 DIAGNOSIS — H401132 Primary open-angle glaucoma, bilateral, moderate stage: Secondary | ICD-10-CM

## 2020-08-25 DIAGNOSIS — H33052 Total retinal detachment, left eye: Secondary | ICD-10-CM | POA: Diagnosis not present

## 2020-08-25 DIAGNOSIS — H35371 Puckering of macula, right eye: Secondary | ICD-10-CM

## 2020-08-25 DIAGNOSIS — H25041 Posterior subcapsular polar age-related cataract, right eye: Secondary | ICD-10-CM | POA: Insufficient documentation

## 2020-08-25 DIAGNOSIS — E113512 Type 2 diabetes mellitus with proliferative diabetic retinopathy with macular edema, left eye: Secondary | ICD-10-CM

## 2020-08-25 HISTORY — DX: Age-related nuclear cataract, right eye: H25.11

## 2020-08-25 HISTORY — DX: Posterior subcapsular polar age-related cataract, right eye: H25.041

## 2020-08-25 NOTE — Patient Instructions (Addendum)
Cataract  A cataract is a buildup of protein that causes the lens of your eye to become cloudy. The lens is normally clear. It is the part of the eye that is behind your iris and pupil. The lens focuses light on the retina, which lets you see clearly. When a lens becomes cloudy, your vision may become blurry. The clouding can range from a tiny dot to complete cloudiness. As some cataracts develop, they can make it harder for you to see things that are far away. (You become more nearsighted.) Other cataracts increase glare. Cataracts can worsen over time, and sometimes the pupil can look white. As cataracts get worse, they cloud more of the lens, making it difficult to see. Cataracts can affect one eye or both eyes. What are the causes? This condition may be caused by age-related eye changes. The lens of the eye is mostly made up of water and protein. Normally, this protein is arranged in a way that keeps the lens clear. Cataracts develop when protein begins to clump together over time. This buildup of protein clouds the lens and lets less light pass through to the retina, which causes blurry vision. What increases the risk? You are more likely to develop this condition if you:  Are 16 years of age or older.  Have diabetes.  Have high blood pressure.  Take certain medicines, such as steroids or hormone replacement therapy.  Have had an eye injury.  Have or have had eye inflammation.  Have a family history of cataracts.  Smoke.  Drink alcohol heavily.  Are frequently exposed to sun or very strong light without eye protection.  Are obese.  Have been exposed to large amounts of radiation, lead, or other toxic substances.  Have had eye surgery. What are the signs or symptoms? The main symptom of a cataract is blurry vision. Your vision may change or get worse over time. Other symptoms include:  Increased glare.  Seeing a bright ring or halo around light.  Poor night  vision.  Double or "shadow" vision in one eye or both eyes.  Having trouble seeing, even while wearing contact lenses or glasses.  Seeing colors that appear faded.  Having trouble telling the difference between blue and purple.  Needing frequent changes to your prescription glasses or contacts. How is this diagnosed? This condition is diagnosed with a medical history and eye exam.  You should see an eye specialist (optometrist or ophthalmologist).  Your health care provider may enlarge (dilate) your pupils with eye drops to see the back of your eye more clearly and look for signs of cataracts or other eye damage. You may also have tests, including:  A visual acuity test. This uses a chart to determine the smallest letters that you can see from a specific distance.  A slit-lamp exam. This uses a microscope to examine small sections of your eye for abnormalities.  Tonometry. This test measures the pressure of the fluid inside your eye.  Glare testing. This test shines a light in your eye while you view letters to see whether the bright light affects your vision. How is this treated? Treatment depends on the stage of your cataract. You may:  Wear eyeglasses or use stronger light. This is for an early-stage cataract.  Have surgery if the condition is severely affecting your vision. This is needed for late-stage cataract.  Stop or change certain medicines. This is recommended if your health care provider thinks your cataract may be linked to your  medicines. Follow these instructions at home: Lifestyle  Use stronger or brighter lighting.  Consider using a magnifying glass for reading or other activities.  Become familiar with your surroundings. Having poor vision can put you at greater risk for tripping, falling, or bumping into things.  Wear sunglasses and a hat if you are sensitive to bright light or are having problems with glare.  Do not use any products that contain  nicotine or tobacco, such as cigarettes, e-cigarettes, and chewing tobacco. If you need help quitting, ask your health care provider. General instructions  If you are prescribed new eyeglasses, wear them as told by your health care provider.  Take over-the-counter and prescription medicines only as told by your health care provider. Do not change your medicines unless told by your health care provider.  Do not drive or use heavy machinery if your vision is blurry, particularly at night.  Keep your blood sugar under control if you have diabetes.  Keep all follow-up visits as told by your health care provider. This is important. Contact a health care provider if:  Your symptoms get worse.  Your vision affects your ability to perform daily activities.  You have new symptoms.  You have a fever. Get help right away if:  You have sudden vision loss.  You have redness, swelling, or increasing pain in your eye.  You develop a headache and sensitivity to light. Summary  A cataract is a buildup of protein that causes the lens of your eye to become cloudy. Cataracts are very common, especially as people age.  Mild cataracts cause mild visual symptoms, while more severe cataracts can cause a significant decrease in quality of life.  Mild cataracts can often be treated with a prescription for new glasses or contact lenses, while surgery is often recommended for more severe cataracts.  Contact a health care provider if your symptoms get worse, your vision affects your ability to do daily activities, or you have a fever.  Get help right away if you have sudden vision loss, redness, swelling, or increasing pain in the eye, or you develop a headache or sensitivity to light. This information is not intended to replace advice given to you by your health care provider. Make sure you discuss any questions you have with your health care provider. Document Revised: 05/29/2018 Document Reviewed:  05/29/2018 Elsevier Patient Education  [place]. Diabetic Retinopathy Diabetic retinopathy is a disease of the retina. The retina is a light-sensitive membrane at the back of the eye. Retinopathy is a complication of diabetes (diabetes mellitus) and a common cause of bad eyesight (visual impairment). It can eventually cause blindness. Early detection and treatment of diabetic retinopathy is important in keeping your eyes healthy and preventing further damage to them. What are the causes? Diabetic retinopathy is caused by blood sugar (glucose) levels that are too high for an extended period of time. High blood glucose over an extended period of time can:  Damage small blood vessels in the retina, allowing blood to leak through the vessel walls.  Cause new, abnormal blood vessels to grow on the retina. This can scar the retina in the advanced stage of diabetic retinopathy. What increases the risk? You are more likely to develop this condition if:  You have had diabetes for a long time.  You have poorly controlled blood glucose.  You have high blood pressure. What are the signs or symptoms? In the early stages of diabetic retinopathy, there are often no symptoms. As  the condition gets worse, symptoms may include:  Blurred vision. This is usually caused by swelling due to abnormal blood glucose levels. The blurriness may go away when blood glucose levels return to normal.  Moving specks or dark spots (floaters) in your vision. These can be caused by a small amount of bleeding (hemorrhage) from retinal blood vessels.  Missing parts of your field of vision, such as vision at the sides of the eyes. This can be caused by larger retinal hemorrhages.  Difficulty reading.  Double vision.  Pain in one or both eyes.  Feeling pressure in one or both eyes.  Trouble seeing straight lines. Straight lines may not look straight.  Redness of the eyes that does not go away. How is this  diagnosed? This condition may be diagnosed with an eye exam in which your eye care specialist puts drops in your eyes that enlarge (dilate) your pupils. This lets your health care provider examine your retina and check for changes in your retinal blood vessels. How is this treated? This condition may be treated by:  Keeping your blood glucose and blood pressure within a target range.  Using a type of laser beam to seal your retinal blood vessels. This stops them from bleeding and decreases pressure in your eye.  Getting shots of medicine in the eye to reduce swelling of the center of the retina (macula). You may be given: ? Anti-VEGF medicine. This medicine can help slow vision loss, and may even improve vision. ? Steroid medicine. Follow these instructions at home:   Follow your diabetes management plan as directed by your health care provider. This may include exercising regularly and eating a healthy diet.  Keep your blood glucose level and your blood pressure in your target range, as directed by your health care provider.  Check your blood glucose as often as directed.  Take over the counter and prescription medicines only as told by your health care provider. This includes insulin and oral diabetes medicine.  Get your eyes checked at least once every year. An eye specialist can usually see diabetic retinopathy developing long before it starts to cause problems. In many cases, it can be treated to prevent complications from occurring.  Do not use any products that contain nicotine or tobacco, such as cigarettes and e-cigarettes. If you need help quitting, ask your health care provider.  Keep all follow-up visits as told by your health care provider. This is important. Contact a health care provider if:  You notice gradual blurring or other changes in your vision over time.  You notice that your glasses or contact lenses do not make things look as sharp as they once did.  You  have trouble reading or seeing details at a distance with either eye.  You notice a change in your vision or notice that parts of your field of vision appear missing or hazy.  You suddenly see moving specks or dark spots in the field of vision of either eye. Get help right away if:  You have sudden pain or pressure in one or both eyes.  You suddenly lose vision or a curtain or veil seems to come across your eyes.  You have a sudden burst of floaters in your vision. Summary  Diabetic retinopathy is a disease of the retina. The retina is a light-sensitive membrane at the back of the eye. Retinopathy is a complication of diabetes.  Get your eyes checked at least once every year. An eye specialist can usually  see diabetic retinopathy developing long before it starts to cause problems. In many cases, it can be treated to prevent complications from occurring.  Keep your blood glucose and your blood pressure in target range. Follow your diabetes management plan as directed by your health care provider.  Protect your eyes. Wear sunglasses and eye protection when needed. This information is not intended to replace advice given to you by your health care provider. Make sure you discuss any questions you have with your health care provider. Document Revised: 01/11/2018 Document Reviewed: 01/03/2017 Elsevier Patient Education  2020 [employer].

## 2020-08-25 NOTE — Assessment & Plan Note (Signed)
OD, minor in the past, not active

## 2020-08-25 NOTE — Assessment & Plan Note (Signed)
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  Will send to [facility] eye care  Patient reports no longer in network with Dr. Kathlen Mody

## 2020-08-25 NOTE — Progress Notes (Signed)
08/25/2020     CHIEF COMPLAINT Patient presents for Retina Follow Up   HISTORY OF PRESENT ILLNESS: Perry Thomas is a 59 y.o. male who presents to the clinic today for:   HPI    Retina Follow Up    Patient presents with  Diabetic Retinopathy.  In both eyes.  Severity is moderate.  Duration of 9 months.  Since onset it is stable.  I, the attending physician,  performed the HPI with the patient and updated documentation appropriately.          Comments    9 Month PDR f\u OU. OCT  Pt states OD vision has decreased. Pt states this started a few months ago. Pt states he has seen cobwebs and blood in OD vision. Pt c/o OD being foggy. Pt using Simbrinza OU BGL: 139 this AM, currently 157 A1C: unknown       Last edited by [name] on 08/25/2020  8:26 AM. (History)      Referring physician: No referring provider defined for this encounter.  HISTORICAL INFORMATION:   Selected notes from the MEDICAL RECORD NUMBER    Lab Results  Component Value Date   HGBA1C 5.2 05/29/2019     CURRENT MEDICATIONS: Current Outpatient Medications (Ophthalmic Drugs)  Medication Sig  . DUREZOL 0.05 % EMUL INSTILL 1 DROP INTO LEFT EYE 2 TIMES WEEKLY (Patient not taking: Reported on 08/25/2020)  . prednisoLONE acetate (PRED FORTE) 1 % ophthalmic suspension Apply 3 drops to each nostril twice daily through 03/13/20 then decrease use to once daily. (Patient not taking: Reported on 08/25/2020)  . SIMBRINZA 1-0.2 % SUSP INSTILL 1 DROP INTO LEFT EYE TWICE A DAY   No current facility-administered medications for this visit. (Ophthalmic Drugs)   Current Outpatient Medications (Other)  Medication Sig  . amLODipine (NORVASC) 10 MG tablet Take by mouth.  Marland Kitchen aspirin 81 MG EC tablet Take by mouth.  . carvedilol (COREG) 12.5 MG tablet Take by mouth.  . clopidogrel (PLAVIX) 75 MG tablet Take 1 tablet (75 mg total) by mouth daily.  . Continuous Blood Gluc Sensor (DEXCOM G6 SENSOR) MISC Apply 1 sensor to  the skin every 10 days for continuous glucose monitoring.  . Continuous Blood Gluc Transmit (DEXCOM G6 TRANSMITTER) MISC USE AS DIRECTED FOR CONTINUOUS GLUCOSE MONITORING. REUSE TRANSMITTER FOR 90 DAYS THEN DISCARD AND REPLACE.  Marland Kitchen Exenatide ER 2 MG/0.85ML AUIJ Inject into the skin.  . furosemide (LASIX) 80 MG tablet Take 1/2 tablet (40 mg) daily as needed;  . insulin glargine (LANTUS SOLOSTAR) 100 UNIT/ML Solostar Pen Inject into the skin.  Marland Kitchen insulin lispro (HUMALOG) 100 UNIT/ML KwikPen Take 6 units with breakfast, 5 units with lunch and dinner  . Lancets (ONETOUCH ULTRASOFT) lancets Use to check blood sugar before breakfast and before supper. E11.65  . mycophenolate (MYFORTIC) 180 MG EC tablet Take by mouth.  . pantoprazole (PROTONIX) 40 MG tablet Take by mouth.  . predniSONE (DELTASONE) 5 MG tablet Take by mouth.  . tacrolimus (PROGRAF) 1 MG capsule Take by mouth.   No current facility-administered medications for this visit. (Other)      REVIEW OF SYSTEMS: ROS    Positive for: Endocrine   Last edited by [name] on 08/25/2020  8:24 AM. (History)       ALLERGIES No Known Allergies  PAST MEDICAL HISTORY Past Medical History:  Diagnosis Date  . Diabetes mellitus without complication ([place])   . ESRD on dialysis ([place])   . Hypertension   .  Hypertension associated with diabetes ([place]) 01/30/2016  . PAD (peripheral artery disease) ([place])   . Type 2 diabetes mellitus ([place]) 01/30/2016   Past Surgical History:  Procedure Laterality Date  . PTCA     peripheral artery disease    FAMILY HISTORY Family History  Problem Relation Age of Onset  . Healthy Mother   . Liver cancer Father   . Kidney disease Sister     SOCIAL HISTORY Social History   Tobacco Use  . Smoking status: Former Smoker    Quit date: 12/13/2009    Years since quitting: 10.7  . Smokeless tobacco: Never Used  Vaping Use  . Vaping Use: Never used  Substance Use Topics  . Alcohol use: No     Alcohol/week: 0.0 standard drinks  . Drug use: No         OPHTHALMIC EXAM:  Base Eye Exam    Visual Acuity (Snellen - Linear)      Right Left   Dist cc 20/40 -1 20/60   Dist ph cc 20/30 -1 20/50   Correction: Glasses       Tonometry (Tonopen, 8:32 AM)      Right Left   Pressure 10 13       Pupils      Pupils Dark Light Shape React APD   Right PERRL 4 3 Round Slow None   Left PERRL 5 5 Round Minimal None       Visual Fields (Counting fingers)      Left Right    Full Full       Neuro/Psych    Oriented x3: Yes   Mood/Affect: Normal       Dilation    Both eyes: 1.0% Mydriacyl, 2.5% Phenylephrine @ 8:32 AM        Slit Lamp and Fundus Exam    External Exam      Right Left   External Normal Normal       Slit Lamp Exam      Right Left   Lids/Lashes Normal Normal   Conjunctiva/Sclera White and quiet White and quiet   Cornea Clear Clear   Anterior Chamber Deep and quiet Deep and quiet   Iris Round and reactive Round and reactive   Lens 2+ Nuclear sclerosis, 2+ Posterior subcapsular cataract Posterior chamber intraocular lens, Centered posterior chamber intraocular lens   Anterior Vitreous Normal Small remnant of oil in the anterior hyaloid       Fundus Exam      Right Left   Posterior Vitreous Normal Clear posteriorly   Disc Normal Normal   C/D Ratio 0.75 0.75   Macula no macular thickening no macular thickening   Vessels PDR-quiet PDR-quiet   Periphery Good PRP peripherally, some room superiorly for laser, yet no active N/V Good PRP peripherally          IMAGING AND PROCEDURES  Imaging and Procedures for 08/25/20  OCT, Retina - OU - Both Eyes       Right Eye Quality was good. Scan locations included subfoveal. Central Foveal Thickness: 331. Progression has been stable. Findings include normal foveal contour.   Left Eye Quality was good. Scan locations included subfoveal. Central Foveal Thickness: 311. Progression has worsened. Findings  include abnormal foveal contour.   Notes OS, new onset CSME compared to 2 years previous, will need to institute therapy with anticoagulant medications                ASSESSMENT/PLAN:  Nuclear sclerotic cataract  of right eye Cataract right eye worsening, affecting his activities of daily living, additionally with new PSC changes  Posterior subcapsular age-related cataract, right eye New onset PSC right eye, centrally located, needs cataract referral.  Will send to [facility] eye care  Patient reports no longer in network with Dr. Kathlen Mody  Right epiretinal membrane OD, minor in the past, not active      ICD-10-CM   1. 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 Nuclear sclerotic cataract of right eye  H25.11   4. Primary open angle glaucoma of both eyes, moderate stage  H40.1132   5. Right epiretinal membrane  H35.371   6. Posterior subcapsular age-related cataract, right eye  H25.041   7. 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  No active proliferative diabetic retinopathy in either eye.  2.  CSME OS is present by OCT, will need treatment to prevent progression OS  3.  Refer for cataract surgery in the right eye.  Ophthalmic Meds Ordered this visit:  No orders of the defined types were placed in this encounter.      Return in about 2 weeks (around 09/08/2020), or , Will need to proceed with cataract extraction intraocular lens placement right eye, for dilate, OS, AVASTIN OCT, will refer for cataract surgery right eye.  Patient Instructions  Cataract  A cataract is a buildup of protein that causes the lens of your eye to become cloudy. The lens is normally clear. It is the part of the eye  that is behind your iris and pupil. The lens focuses light on the retina, which lets you see clearly. When a lens becomes cloudy, your vision may become blurry. The clouding can range from a tiny dot to complete cloudiness. As some cataracts develop, they can make it harder for you to see things that are far away. (You become more nearsighted.) Other cataracts increase glare. Cataracts can worsen over time, and sometimes the pupil can look white. As cataracts get worse, they cloud more of the lens, making it difficult to see. Cataracts can affect one eye or both eyes. What are the causes? This condition may be caused by age-related eye changes. The lens of the eye is mostly made up of water and protein. Normally, this protein is arranged in a way that keeps the lens clear. Cataracts develop when protein begins to clump together over time. This buildup of protein clouds the lens and lets less light pass through to the retina, which causes blurry vision. What increases the risk? You are more likely to develop this condition if you:  Are 36 years of age or older.  Have diabetes.  Have high blood pressure.  Take certain medicines, such as steroids or hormone replacement therapy.  Have had an eye injury.  Have or have had eye inflammation.  Have a family history of cataracts.  Smoke.  Drink alcohol heavily.  Are frequently exposed to sun or very strong light without eye protection.  Are obese.  Have been exposed to large amounts of radiation, lead, or other toxic substances.  Have had eye surgery. What are the signs or symptoms? The main symptom of a cataract is blurry vision. Your vision may change or get worse  over time. Other symptoms include:  Increased glare.  Seeing a bright ring or halo around light.  Poor night vision.  Double or "shadow" vision in one eye or both eyes.  Having trouble seeing, even while wearing contact lenses or glasses.  Seeing colors that appear  faded.  Having trouble telling the difference between blue and purple.  Needing frequent changes to your prescription glasses or contacts. How is this diagnosed? This condition is diagnosed with a medical history and eye exam.  You should see an eye specialist (optometrist or ophthalmologist).  Your health care provider may enlarge (dilate) your pupils with eye drops to see the back of your eye more clearly and look for signs of cataracts or other eye damage. You may also have tests, including:  A visual acuity test. This uses a chart to determine the smallest letters that you can see from a specific distance.  A slit-lamp exam. This uses a microscope to examine small sections of your eye for abnormalities.  Tonometry. This test measures the pressure of the fluid inside your eye.  Glare testing. This test shines a light in your eye while you view letters to see whether the bright light affects your vision. How is this treated? Treatment depends on the stage of your cataract. You may:  Wear eyeglasses or use stronger light. This is for an early-stage cataract.  Have surgery if the condition is severely affecting your vision. This is needed for late-stage cataract.  Stop or change certain medicines. This is recommended if your health care provider thinks your cataract may be linked to your medicines. Follow these instructions at home: Lifestyle  Use stronger or brighter lighting.  Consider using a magnifying glass for reading or other activities.  Become familiar with your surroundings. Having poor vision can put you at greater risk for tripping, falling, or bumping into things.  Wear sunglasses and a hat if you are sensitive to bright light or are having problems with glare.  Do not use any products that contain nicotine or tobacco, such as cigarettes, e-cigarettes, and chewing tobacco. If you need help quitting, ask your health care provider. General instructions  If you are  prescribed new eyeglasses, wear them as told by your health care provider.  Take over-the-counter and prescription medicines only as told by your health care provider. Do not change your medicines unless told by your health care provider.  Do not drive or use heavy machinery if your vision is blurry, particularly at night.  Keep your blood sugar under control if you have diabetes.  Keep all follow-up visits as told by your health care provider. This is important. Contact a health care provider if:  Your symptoms get worse.  Your vision affects your ability to perform daily activities.  You have new symptoms.  You have a fever. Get help right away if:  You have sudden vision loss.  You have redness, swelling, or increasing pain in your eye.  You develop a headache and sensitivity to light. Summary  A cataract is a buildup of protein that causes the lens of your eye to become cloudy. Cataracts are very common, especially as people age.  Mild cataracts cause mild visual symptoms, while more severe cataracts can cause a significant decrease in quality of life.  Mild cataracts can often be treated with a prescription for new glasses or contact lenses, while surgery is often recommended for more severe cataracts.  Contact a health care provider if your symptoms get worse,  your vision affects your ability to do daily activities, or you have a fever.  Get help right away if you have sudden vision loss, redness, swelling, or increasing pain in the eye, or you develop a headache or sensitivity to light. This information is not intended to replace advice given to you by your health care provider. Make sure you discuss any questions you have with your health care provider. Document Revised: 05/29/2018 Document Reviewed: 05/29/2018 Elsevier Patient Education  [place]. Diabetic Retinopathy Diabetic retinopathy is a disease of the retina. The retina is a light-sensitive membrane  at the back of the eye. Retinopathy is a complication of diabetes (diabetes mellitus) and a common cause of bad eyesight (visual impairment). It can eventually cause blindness. Early detection and treatment of diabetic retinopathy is important in keeping your eyes healthy and preventing further damage to them. What are the causes? Diabetic retinopathy is caused by blood sugar (glucose) levels that are too high for an extended period of time. High blood glucose over an extended period of time can:  Damage small blood vessels in the retina, allowing blood to leak through the vessel walls.  Cause new, abnormal blood vessels to grow on the retina. This can scar the retina in the advanced stage of diabetic retinopathy. What increases the risk? You are more likely to develop this condition if:  You have had diabetes for a long time.  You have poorly controlled blood glucose.  You have high blood pressure. What are the signs or symptoms? In the early stages of diabetic retinopathy, there are often no symptoms. As the condition gets worse, symptoms may include:  Blurred vision. This is usually caused by swelling due to abnormal blood glucose levels. The blurriness may go away when blood glucose levels return to normal.  Moving specks or dark spots (floaters) in your vision. These can be caused by a small amount of bleeding (hemorrhage) from retinal blood vessels.  Missing parts of your field of vision, such as vision at the sides of the eyes. This can be caused by larger retinal hemorrhages.  Difficulty reading.  Double vision.  Pain in one or both eyes.  Feeling pressure in one or both eyes.  Trouble seeing straight lines. Straight lines may not look straight.  Redness of the eyes that does not go away. How is this diagnosed? This condition may be diagnosed with an eye exam in which your eye care specialist puts drops in your eyes that enlarge (dilate) your pupils. This lets your health  care provider examine your retina and check for changes in your retinal blood vessels. How is this treated? This condition may be treated by:  Keeping your blood glucose and blood pressure within a target range.  Using a type of laser beam to seal your retinal blood vessels. This stops them from bleeding and decreases pressure in your eye.  Getting shots of medicine in the eye to reduce swelling of the center of the retina (macula). You may be given: ? Anti-VEGF medicine. This medicine can help slow vision loss, and may even improve vision. ? Steroid medicine. Follow these instructions at home:   Follow your diabetes management plan as directed by your health care provider. This may include exercising regularly and eating a healthy diet.  Keep your blood glucose level and your blood pressure in your target range, as directed by your health care provider.  Check your blood glucose as often as directed.  Take over the counter and  prescription medicines only as told by your health care provider. This includes insulin and oral diabetes medicine.  Get your eyes checked at least once every year. An eye specialist can usually see diabetic retinopathy developing long before it starts to cause problems. In many cases, it can be treated to prevent complications from occurring.  Do not use any products that contain nicotine or tobacco, such as cigarettes and e-cigarettes. If you need help quitting, ask your health care provider.  Keep all follow-up visits as told by your health care provider. This is important. Contact a health care provider if:  You notice gradual blurring or other changes in your vision over time.  You notice that your glasses or contact lenses do not make things look as sharp as they once did.  You have trouble reading or seeing details at a distance with either eye.  You notice a change in your vision or notice that parts of your field of vision appear missing or  hazy.  You suddenly see moving specks or dark spots in the field of vision of either eye. Get help right away if:  You have sudden pain or pressure in one or both eyes.  You suddenly lose vision or a curtain or veil seems to come across your eyes.  You have a sudden burst of floaters in your vision. Summary  Diabetic retinopathy is a disease of the retina. The retina is a light-sensitive membrane at the back of the eye. Retinopathy is a complication of diabetes.  Get your eyes checked at least once every year. An eye specialist can usually see diabetic retinopathy developing long before it starts to cause problems. In many cases, it can be treated to prevent complications from occurring.  Keep your blood glucose and your blood pressure in target range. Follow your diabetes management plan as directed by your health care provider.  Protect your eyes. Wear sunglasses and eye protection when needed. This information is not intended to replace advice given to you by your health care provider. Make sure you discuss any questions you have with your health care provider. Document Revised: 01/11/2018 Document Reviewed: 01/03/2017 Elsevier Patient Education  2020 [employer].     Explained the diagnoses, plan, and follow up with the patient and they expressed understanding.  Patient expressed understanding of the importance of proper follow up care.   Clent Demark Greysin Medlen M.D. Diseases & Surgery of the Retina and Vitreous [facility]ina [facility]abetic [place] 08/25/20     Abbreviations: M myopia (nearsighted); A astigmatism; H hyperopia (farsighted); P presbyopia; Mrx spectacle prescription;  CTL contact lenses; OD right eye; OS left eye; OU both eyes  XT exotropia; ET esotropia; PEK punctate epithelial keratitis; PEE punctate epithelial erosions; DES dry eye syndrome; MGD meibomian gland dysfunction; ATs artificial tears; PFAT's preservative free artificial tears; [place] nuclear sclerotic cataract; PSC  posterior subcapsular cataract; ERM epi-retinal membrane; PVD posterior vitreous detachment; RD retinal detachment; DM diabetes mellitus; DR diabetic retinopathy; NPDR non-proliferative diabetic retinopathy; PDR proliferative diabetic retinopathy; CSME clinically significant macular edema; DME diabetic macular edema; dbh dot blot hemorrhages; CWS cotton wool spot; POAG primary open angle glaucoma; C/D cup-to-disc ratio; HVF humphrey visual field; GVF goldmann visual field; OCT optical coherence tomography; IOP intraocular pressure; BRVO Branch retinal vein occlusion; CRVO central retinal vein occlusion; CRAO central retinal artery occlusion; BRAO branch retinal artery occlusion; RT retinal tear; SB scleral buckle; PPV pars plana vitrectomy; VH Vitreous hemorrhage; PRP panretinal laser photocoagulation; IVK intravitreal kenalog; VMT vitreomacular  traction; MH Macular hole;  NVD neovascularization of the disc; NVE neovascularization elsewhere; AREDS age related eye disease study; ARMD age related macular degeneration; POAG primary open angle glaucoma; EBMD epithelial/anterior basement membrane dystrophy; ACIOL anterior chamber intraocular lens; IOL intraocular lens; PCIOL posterior chamber intraocular lens; Phaco/IOL phacoemulsification with intraocular lens placement; [place] photorefractive keratectomy; LASIK laser assisted in situ keratomileusis; HTN hypertension; DM diabetes mellitus; COPD chronic obstructive pulmonary disease

## 2020-08-25 NOTE — Assessment & Plan Note (Signed)
Cataract right eye worsening, affecting his activities of daily living, additionally with new PSC changes

## 2020-08-27 ENCOUNTER — Other Ambulatory Visit: Payer: Self-pay

## 2020-08-27 ENCOUNTER — Encounter: Payer: Self-pay | Admitting: Podiatry

## 2020-08-27 ENCOUNTER — Ambulatory Visit: Payer: Medicaid Other | Admitting: Podiatry

## 2020-08-27 DIAGNOSIS — B351 Tinea unguium: Secondary | ICD-10-CM

## 2020-08-27 DIAGNOSIS — M79676 Pain in unspecified toe(s): Secondary | ICD-10-CM | POA: Diagnosis not present

## 2020-08-27 DIAGNOSIS — E119 Type 2 diabetes mellitus without complications: Secondary | ICD-10-CM

## 2020-08-27 DIAGNOSIS — N186 End stage renal disease: Secondary | ICD-10-CM

## 2020-08-27 DIAGNOSIS — D689 Coagulation defect, unspecified: Secondary | ICD-10-CM

## 2020-08-27 NOTE — Progress Notes (Signed)
Patient ID: Perry Thomas, male   DOB: 10-Aug-1961, 59 y.o.   MRN: [id number] Complaint:  Visit Type: Patient returns to my office for continued preventative foot care services. Complaint: Patient states" my nails have grown long and thick and become painful to walk and wear shoes" Patient has been diagnosed with DM with no foot complications.  Patient has history of kidney transplant.. The patient presents for preventative foot care services. No changes to ROS.  Patient is taking plavix which causes coagulation defect.    Podiatric Exam: Vascular: dorsalis pedis and posterior tibial pulses are palpable bilateral. Capillary return is immediate. Temperature gradient is WNL. Skin turgor WNL  Sensorium: Normal Semmes Weinstein monofilament test. Normal tactile sensation bilaterally. Nail Exam: Pt has thick disfigured discolored nails with subungual debris noted bilateral entire nail hallux through fifth toenails Ulcer Exam: There is no evidence of ulcer or pre-ulcerative changes or infection. Orthopedic Exam: Muscle tone and strength are WNL. No limitations in general ROM. No crepitus or effusions noted. Foot type and digits show no abnormalities. Bony prominences are unremarkable. Skin:  Porokeratosis sub 5 left.. No infection or ulcers  Diagnosis:  Onychomycosis, , Pain in right toe, pain in left toes    Treatment & Plan Procedures and Treatment: Consent by patient was obtained for treatment procedures. The patient understood the discussion of treatment and procedures well. All questions were answered thoroughly reviewed. Debridement of mycotic and hypertrophic toenails, 1 through 5 bilateral and clearing of subungual debris. No ulceration, no infection noted.  Return Visit-Office Procedure: Patient instructed to return to the office for a follow up visit 10 weeks  for continued evaluation and treatment.    Gardiner Barefoot DPM

## 2020-09-08 ENCOUNTER — Other Ambulatory Visit: Payer: Self-pay

## 2020-09-08 ENCOUNTER — Encounter (INDEPENDENT_AMBULATORY_CARE_PROVIDER_SITE_OTHER): Payer: Self-pay | Admitting: Ophthalmology

## 2020-09-08 ENCOUNTER — Ambulatory Visit (INDEPENDENT_AMBULATORY_CARE_PROVIDER_SITE_OTHER): Payer: Medicaid Other | Admitting: Ophthalmology

## 2020-09-08 DIAGNOSIS — H2511 Age-related nuclear cataract, right eye: Secondary | ICD-10-CM | POA: Diagnosis not present

## 2020-09-08 DIAGNOSIS — E113512 Type 2 diabetes mellitus with proliferative diabetic retinopathy with macular edema, left eye: Secondary | ICD-10-CM | POA: Diagnosis not present

## 2020-09-08 MED ORDER — BEVACIZUMAB CHEMO INJECTION 1.25MG/0.05ML SYRINGE FOR KALEIDOSCOPE
1.2500 mg | INTRAVITREAL | Status: AC | PRN
  Administered 2020-09-08: 1.25 mg via INTRAVITREAL

## 2020-09-08 NOTE — Progress Notes (Signed)
09/08/2020     CHIEF COMPLAINT Patient presents for Retina Follow Up   HISTORY OF PRESENT ILLNESS: Perry Thomas is a 59 y.o. male who presents to the clinic today for:   HPI    Retina Follow Up    Patient presents with  Diabetic Retinopathy.  In left eye.  Severity is moderate.  Duration of 2 weeks.  Since onset it is stable.  I, the attending physician,  performed the HPI with the patient and updated documentation appropriately.          Comments    2 Week PDR f\u OS. Possible Avastin OS. OCT  Pt c/o OD still being blurry. Pt states vision is not good. Pt is seeing Dr. Katy Thomas on Wednesday. BGL: did not check       Last edited by Perry Thomas on 09/08/2020  8:32 AM. (History)      Referring physician: No referring provider defined for this encounter.  HISTORICAL INFORMATION:   Selected notes from the MEDICAL RECORD NUMBER    Lab Results  Component Value Date   HGBA1C 5.2 05/29/2019     CURRENT MEDICATIONS: Current Outpatient Medications (Ophthalmic Drugs)  Medication Sig  . DUREZOL 0.05 % EMUL INSTILL 1 DROP INTO LEFT EYE 2 TIMES WEEKLY  . prednisoLONE acetate (PRED FORTE) 1 % ophthalmic suspension Apply 3 drops to each nostril twice daily through 03/13/20 then decrease use to once daily.  Marland Kitchen SIMBRINZA 1-0.2 % SUSP INSTILL 1 DROP INTO LEFT EYE TWICE A DAY   No current facility-administered medications for this visit. (Ophthalmic Drugs)   Current Outpatient Medications (Other)  Medication Sig  . amLODipine (NORVASC) 10 MG tablet Take by mouth.  Marland Kitchen aspirin 81 MG EC tablet Take by mouth.  . carvedilol (COREG) 12.5 MG tablet Take by mouth.  . clopidogrel (PLAVIX) 75 MG tablet Take 1 tablet (75 mg total) by mouth daily.  . Continuous Blood Gluc Sensor (DEXCOM G6 SENSOR) MISC Apply 1 sensor to the skin every 10 days for continuous glucose monitoring.  . Continuous Blood Gluc Transmit (DEXCOM G6 TRANSMITTER) MISC USE AS DIRECTED FOR CONTINUOUS GLUCOSE MONITORING.  REUSE TRANSMITTER FOR 90 DAYS THEN DISCARD AND REPLACE.  Marland Kitchen Exenatide ER 2 MG/0.85ML AUIJ Inject into the skin.  . furosemide (LASIX) 80 MG tablet Take 1/2 tablet (40 mg) daily as needed;  . insulin glargine (LANTUS SOLOSTAR) 100 UNIT/ML Solostar Pen Inject into the skin.  Marland Kitchen insulin lispro (HUMALOG) 100 UNIT/ML KwikPen Take 6 units with breakfast, 5 units with lunch and dinner  . Lancets (ONETOUCH ULTRASOFT) lancets Use to check blood sugar before breakfast and before supper. E11.65  . mycophenolate (MYFORTIC) 180 MG EC tablet Take by mouth.  . pantoprazole (PROTONIX) 40 MG tablet Take by mouth.  . predniSONE (DELTASONE) 5 MG tablet Take by mouth.  . tacrolimus (PROGRAF) 1 MG capsule Take by mouth.   No current facility-administered medications for this visit. (Other)      REVIEW OF SYSTEMS:    ALLERGIES No Known Allergies  PAST MEDICAL HISTORY Past Medical History:  Diagnosis Date  . Diabetes mellitus without complication ([place])   . ESRD on dialysis ([place])   . Hypertension   . Hypertension associated with diabetes ([place]) 01/30/2016  . PAD (peripheral artery disease) ([place])   . Type 2 diabetes mellitus ([place]) 01/30/2016   Past Surgical History:  Procedure Laterality Date  . PTCA     peripheral artery disease    FAMILY HISTORY Family History  Problem Relation Age of Onset  . Healthy Mother   . Liver cancer Father   . Kidney disease Sister     SOCIAL HISTORY Social History   Tobacco Use  . Smoking status: Former Smoker    Quit date: 12/13/2009    Years since quitting: 10.7  . Smokeless tobacco: Never Used  Vaping Use  . Vaping Use: Never used  Substance Use Topics  . Alcohol use: No    Alcohol/week: 0.0 standard drinks  . Drug use: No         OPHTHALMIC EXAM: Base Eye Exam    Visual Acuity (Snellen - Linear)      Right Left   Dist cc 20/50 20/50 -1   Dist ph cc 20/40 -1 20/40 -2   Correction: Glasses       Tonometry (Tonopen, 8:38 AM)      Right Left    Pressure 14 12       Pupils      Pupils Dark Light Shape React APD   Right PERRL 4 3 Round Slow None   Left PERRL 5 5 Round Minimal None       Neuro/Psych    Oriented x3: Yes   Mood/Affect: Normal       Dilation    Left eye: 1.0% Mydriacyl, 2.5% Phenylephrine @ 8:38 AM        Slit Lamp and Fundus Exam    External Exam      Right Left   External Normal Normal       Slit Lamp Exam      Right Left   Lids/Lashes Normal Normal   Conjunctiva/Sclera White and quiet White and quiet   Cornea Clear Clear   Anterior Chamber Deep and quiet Deep and quiet   Iris Round and reactive Round and reactive   Lens 2+ Nuclear sclerosis, 2+ Posterior subcapsular cataract Posterior chamber intraocular lens, Centered posterior chamber intraocular lens   Anterior Vitreous Normal Small remnant of oil in the anterior hyaloid       Fundus Exam      Right Left   Posterior Vitreous  Clear posteriorly   Disc  Normal   C/D Ratio  0.75   Macula  no clinical macular thickening   Vessels  PDR-quiet   Periphery  Good PRP peripherally          IMAGING AND PROCEDURES  Imaging and Procedures for 09/08/20  OCT, Retina - OU - Both Eyes       Right Eye Quality was good. Scan locations included subfoveal. Central Foveal Thickness: 253. Progression has been stable. Findings include normal foveal contour.   Left Eye Quality was good. Scan locations included subfoveal. Central Foveal Thickness: 277. Progression has been stable. Findings include cystoid macular edema, abnormal foveal contour.   Notes CSME OS, requires delivery of anterior vegF OS today  OD, no active maculopathy       Intravitreal Injection, Pharmacologic Agent - OS - Left Eye       Time Out 09/08/2020. 9:22 AM. Confirmed correct patient, procedure, site, and patient consented.   Anesthesia Topical anesthesia was used. Anesthetic medications included Akten 3.5%.   Procedure Preparation included Ofloxacin , Tobramycin  0.3%, 10% betadine to eyelids, 5% betadine to ocular surface. A supplied needle was used.   Injection:  1.25 mg Bevacizumab (AVASTIN) SOLN   NDC: [address]-0, Lot: [id number]   Route: Intravitreal, Site: Left Eye, Waste: 0 mg  Post-op Post injection exam found visual acuity  of at least counting fingers. The patient tolerated the procedure well. There were no complications. The patient received written and verbal post procedure care education. Post injection medications were not given.                 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 Carolynn Sayers as scheduled this week      ICD-10-CM   1. Diabetic macular edema of left eye with proliferative retinopathy associated with type 2 diabetes mellitus (HCC)  P61.9509 OCT, Retina - OU - Both Eyes    Intravitreal Injection, Pharmacologic Agent - OS - Left Eye    Bevacizumab (AVASTIN) SOLN 1.25 mg  2. Nuclear sclerotic cataract of right eye  H25.11     1.  Commence with intravitreal Avastin OS today for diabetic CSME  2.  See with cataract surgery of the right eye at any time  3.  Dilated examination left eye in 5 weeks possible injection intravitreal Avastin  Ophthalmic Meds Ordered this visit:  Meds ordered this encounter  Medications  . Bevacizumab (AVASTIN) SOLN 1.25 mg       Return in about 5 weeks (around 10/13/2020) for dilate, OS, AVASTIN OCT.  There are no Patient Instructions on file for this visit.   Explained the diagnoses, plan, and follow up with the patient and they expressed understanding.  Patient expressed understanding of the importance of proper follow up care.   Clent Demark Hedy Garro M.D. Diseases & Surgery of the Retina and Vitreous Retina & Diabetic [place] 09/08/20     Abbreviations: M myopia (nearsighted); A astigmatism; H hyperopia (farsighted); P presbyopia; Mrx spectacle prescription;  CTL contact lenses; OD right eye; OS left eye; OU both eyes  XT exotropia; ET esotropia; PEK punctate epithelial keratitis; PEE punctate epithelial erosions; DES dry eye syndrome; MGD meibomian gland dysfunction; ATs artificial tears; PFAT's preservative free artificial tears; [place] nuclear sclerotic cataract; PSC posterior subcapsular cataract; ERM epi-retinal membrane; PVD posterior vitreous detachment; RD retinal detachment; DM diabetes mellitus; DR diabetic retinopathy; NPDR non-proliferative diabetic retinopathy; PDR proliferative diabetic retinopathy; CSME clinically significant macular edema; DME diabetic macular edema; dbh dot blot hemorrhages; CWS cotton wool spot; POAG primary open angle glaucoma; C/D cup-to-disc ratio; HVF humphrey visual field; GVF goldmann visual field; OCT optical coherence tomography; IOP intraocular pressure; BRVO Branch retinal vein occlusion; CRVO central retinal vein occlusion; CRAO central retinal artery occlusion; BRAO branch retinal artery occlusion; RT retinal tear; SB scleral buckle; PPV pars plana vitrectomy; VH Vitreous hemorrhage; PRP panretinal laser photocoagulation; IVK intravitreal kenalog; VMT vitreomacular traction; MH Macular hole;  NVD neovascularization of the disc; NVE neovascularization elsewhere; AREDS age related eye disease study; ARMD age related macular degeneration; POAG primary open angle glaucoma; EBMD epithelial/anterior basement membrane dystrophy; ACIOL anterior chamber intraocular lens; IOL intraocular lens; PCIOL posterior chamber intraocular lens; Phaco/IOL phacoemulsification with intraocular lens placement; [place] photorefractive keratectomy; LASIK laser assisted in situ keratomileusis; HTN hypertension; DM diabetes mellitus; COPD chronic obstructive pulmonary disease

## 2020-09-08 NOTE — Assessment & Plan Note (Signed)
To proceed with cataract surgery OD at any time, follow-up with Dr. Carolynn Sayers as scheduled this week

## 2020-09-08 NOTE — Assessment & Plan Note (Signed)
Intravitreal Avastin OS today

## 2020-09-08 NOTE — Patient Instructions (Signed)
Is to notify the office promptly if new visual acuity decline or difficulties

## 2020-10-14 ENCOUNTER — Emergency Department (HOSPITAL_COMMUNITY): Payer: Medicaid Other

## 2020-10-14 ENCOUNTER — Encounter (INDEPENDENT_AMBULATORY_CARE_PROVIDER_SITE_OTHER): Payer: Self-pay | Admitting: Ophthalmology

## 2020-10-14 ENCOUNTER — Ambulatory Visit (INDEPENDENT_AMBULATORY_CARE_PROVIDER_SITE_OTHER): Payer: Medicaid Other | Admitting: Ophthalmology

## 2020-10-14 ENCOUNTER — Emergency Department (HOSPITAL_COMMUNITY)
Admission: EM | Admit: 2020-10-14 | Discharge: 2020-10-15 | Disposition: A | Discharge status: Home / Self Care | Payer: Medicaid Other | Attending: Emergency Medicine | Admitting: Emergency Medicine

## 2020-10-14 ENCOUNTER — Other Ambulatory Visit: Payer: Self-pay

## 2020-10-14 DIAGNOSIS — Z992 Dependence on renal dialysis: Secondary | ICD-10-CM | POA: Insufficient documentation

## 2020-10-14 DIAGNOSIS — Z7982 Long term (current) use of aspirin: Secondary | ICD-10-CM | POA: Insufficient documentation

## 2020-10-14 DIAGNOSIS — S50312A Abrasion of left elbow, initial encounter: Secondary | ICD-10-CM | POA: Insufficient documentation

## 2020-10-14 DIAGNOSIS — N186 End stage renal disease: Secondary | ICD-10-CM | POA: Diagnosis not present

## 2020-10-14 DIAGNOSIS — E1122 Type 2 diabetes mellitus with diabetic chronic kidney disease: Secondary | ICD-10-CM | POA: Insufficient documentation

## 2020-10-14 DIAGNOSIS — H35371 Puckering of macula, right eye: Secondary | ICD-10-CM

## 2020-10-14 DIAGNOSIS — S80211A Abrasion, right knee, initial encounter: Secondary | ICD-10-CM | POA: Diagnosis not present

## 2020-10-14 DIAGNOSIS — W19XXXA Unspecified fall, initial encounter: Secondary | ICD-10-CM

## 2020-10-14 DIAGNOSIS — S80212A Abrasion, left knee, initial encounter: Secondary | ICD-10-CM | POA: Diagnosis not present

## 2020-10-14 DIAGNOSIS — Z7901 Long term (current) use of anticoagulants: Secondary | ICD-10-CM | POA: Diagnosis not present

## 2020-10-14 DIAGNOSIS — Z961 Presence of intraocular lens: Secondary | ICD-10-CM

## 2020-10-14 DIAGNOSIS — Z79899 Other long term (current) drug therapy: Secondary | ICD-10-CM | POA: Insufficient documentation

## 2020-10-14 DIAGNOSIS — Z794 Long term (current) use of insulin: Secondary | ICD-10-CM | POA: Insufficient documentation

## 2020-10-14 DIAGNOSIS — Y9301 Activity, walking, marching and hiking: Secondary | ICD-10-CM | POA: Insufficient documentation

## 2020-10-14 DIAGNOSIS — M542 Cervicalgia: Secondary | ICD-10-CM | POA: Diagnosis not present

## 2020-10-14 DIAGNOSIS — Y92512 Supermarket, store or market as the place of occurrence of the external cause: Secondary | ICD-10-CM | POA: Insufficient documentation

## 2020-10-14 DIAGNOSIS — E113512 Type 2 diabetes mellitus with proliferative diabetic retinopathy with macular edema, left eye: Secondary | ICD-10-CM | POA: Diagnosis not present

## 2020-10-14 DIAGNOSIS — Z87891 Personal history of nicotine dependence: Secondary | ICD-10-CM | POA: Insufficient documentation

## 2020-10-14 DIAGNOSIS — S0990XA Unspecified injury of head, initial encounter: Secondary | ICD-10-CM

## 2020-10-14 DIAGNOSIS — T07XXXA Unspecified multiple injuries, initial encounter: Secondary | ICD-10-CM

## 2020-10-14 DIAGNOSIS — I120 Hypertensive chronic kidney disease with stage 5 chronic kidney disease or end stage renal disease: Secondary | ICD-10-CM | POA: Diagnosis not present

## 2020-10-14 DIAGNOSIS — W01198A Fall on same level from slipping, tripping and stumbling with subsequent striking against other object, initial encounter: Secondary | ICD-10-CM | POA: Diagnosis not present

## 2020-10-14 DIAGNOSIS — Z23 Encounter for immunization: Secondary | ICD-10-CM | POA: Insufficient documentation

## 2020-10-14 MED ORDER — BEVACIZUMAB CHEMO INJECTION 1.25MG/0.05ML SYRINGE FOR KALEIDOSCOPE
1.2500 mg | INTRAVITREAL | Status: AC | PRN
  Administered 2020-10-14: 1.25 mg via INTRAVITREAL

## 2020-10-14 NOTE — Progress Notes (Signed)
10/14/2020     CHIEF COMPLAINT Patient presents for Retina Follow Up   HISTORY OF PRESENT ILLNESS: Perry Thomas is a 59 y.o. male who presents to the clinic today for:   HPI    Retina Follow Up    Patient presents with  Diabetic Retinopathy.  In left eye.  Severity is moderate.  Duration of 5 weeks.  Since onset it is stable.  I, the attending physician,  performed the HPI with the patient and updated documentation appropriately.          Comments    5 Week PDR f\u OS Possible Avastin OS. OCT OD 2 weeks status post cataract extraction with intraocular lens placement, multifocal   Pt states no issues with vision. Pt had OD Cat sx about 2 weeks ago. Performed by Dr. [name].  BGL: 112 this AM, 141 currently        Last edited by Hurman Horn, MD on 10/14/2020  9:10 AM. (History)      Referring physician: No referring provider defined for this encounter.  HISTORICAL INFORMATION:   Selected notes from the MEDICAL RECORD NUMBER    Lab Results  Component Value Date   HGBA1C 5.2 05/29/2019     CURRENT MEDICATIONS: Current Outpatient Medications (Ophthalmic Drugs)  Medication Sig  . DUREZOL 0.05 % EMUL INSTILL 1 DROP INTO LEFT EYE 2 TIMES WEEKLY  . prednisoLONE acetate (PRED FORTE) 1 % ophthalmic suspension Apply 3 drops to each nostril twice daily through 03/13/20 then decrease use to once daily.  Marland Kitchen SIMBRINZA 1-0.2 % SUSP INSTILL 1 DROP INTO LEFT EYE TWICE A DAY   No current facility-administered medications for this visit. (Ophthalmic Drugs)   Current Outpatient Medications (Other)  Medication Sig  . amLODipine (NORVASC) 10 MG tablet Take by mouth.  Marland Kitchen aspirin 81 MG EC tablet Take by mouth.  . carvedilol (COREG) 12.5 MG tablet Take by mouth.  . clopidogrel (PLAVIX) 75 MG tablet Take 1 tablet (75 mg total) by mouth daily.  . Continuous Blood Gluc Sensor (DEXCOM G6 SENSOR) MISC Apply 1 sensor to the skin every 10 days for continuous glucose monitoring.  . Continuous  Blood Gluc Transmit (DEXCOM G6 TRANSMITTER) MISC USE AS DIRECTED FOR CONTINUOUS GLUCOSE MONITORING. REUSE TRANSMITTER FOR 90 DAYS THEN DISCARD AND REPLACE.  Marland Kitchen Exenatide ER 2 MG/0.85ML AUIJ Inject into the skin.  . furosemide (LASIX) 80 MG tablet Take 1/2 tablet (40 mg) daily as needed;  . insulin glargine (LANTUS SOLOSTAR) 100 UNIT/ML Solostar Pen Inject into the skin.  Marland Kitchen insulin lispro (HUMALOG) 100 UNIT/ML KwikPen Take 6 units with breakfast, 5 units with lunch and dinner  . Lancets (ONETOUCH ULTRASOFT) lancets Use to check blood sugar before breakfast and before supper. E11.65  . mycophenolate (MYFORTIC) 180 MG EC tablet Take by mouth.  . pantoprazole (PROTONIX) 40 MG tablet Take by mouth.  . predniSONE (DELTASONE) 5 MG tablet Take by mouth.  . tacrolimus (PROGRAF) 1 MG capsule Take by mouth.   No current facility-administered medications for this visit. (Other)      REVIEW OF SYSTEMS: ROS    Positive for: Endocrine   Last edited by [name] on 10/14/2020  8:12 AM. (History)       ALLERGIES No Known Allergies  PAST MEDICAL HISTORY Past Medical History:  Diagnosis Date  . Diabetes mellitus without complication ([place])   . ESRD on dialysis ([place])   . Hypertension   . Hypertension associated with diabetes ([place]) 01/30/2016  .  Nuclear sclerotic cataract of right eye 08/25/2020  . PAD (peripheral artery disease) ([place])   . Posterior subcapsular age-related cataract, right eye 08/25/2020  . Type 2 diabetes mellitus ([place]) 01/30/2016   Past Surgical History:  Procedure Laterality Date  . PTCA     peripheral artery disease    FAMILY HISTORY Family History  Problem Relation Age of Onset  . Healthy Mother   . Liver cancer Father   . Kidney disease Sister     SOCIAL HISTORY Social History   Tobacco Use  . Smoking status: Former Smoker    Quit date: 12/13/2009    Years since quitting: 10.8  . Smokeless tobacco: Never Used  Vaping Use  . Vaping Use: Never used    Substance Use Topics  . Alcohol use: No    Alcohol/week: 0.0 standard drinks  . Drug use: No         OPHTHALMIC EXAM: Base Eye Exam    Visual Acuity (Snellen - Linear)      Right Left   Dist [place] 20/25 + 20/60 +   Dist ph [place]  20/50       Tonometry (Tonopen, 8:17 AM)      Right Left   Pressure 15 16       Pupils      Pupils Dark Light Shape React APD   Right PERRL 4 4 Round Minimal None   Left PERRL 4 4 Round Minimal None       Visual Fields (Counting fingers)      Left Right    Full Full       Neuro/Psych    Oriented x3: Yes   Mood/Affect: Normal       Dilation    Left eye: 1.0% Mydriacyl, 2.5% Phenylephrine @ 8:17 AM        Slit Lamp and Fundus Exam    External Exam      Right Left   External Normal Normal       Slit Lamp Exam      Right Left   Lids/Lashes Normal Normal   Conjunctiva/Sclera White and quiet White and quiet   Cornea Clear Clear   Anterior Chamber Deep and quiet Deep and quiet   Iris Round and reactive Round and reactive   Lens Well centered multifocal IOL Posterior chamber intraocular lens, Centered posterior chamber intraocular lens   Anterior Vitreous Normal Small remnant of oil in the anterior hyaloid       Fundus Exam      Right Left   Posterior Vitreous  Clear posteriorly   Disc  1+ Optic disc atrophy, 1+ Pallor   C/D Ratio  0.75   Macula  no clinical macular thickening, Mild clinically significant macular edema, Microaneurysms   Vessels  PDR-quiet   Periphery  Good PRP peripherally          IMAGING AND PROCEDURES  Imaging and Procedures for 10/14/20  OCT, Retina - OU - Both Eyes       Right Eye Quality was good. Scan locations included subfoveal. Central Foveal Thickness: 254. Progression has been stable. Findings include normal observations.   Left Eye Quality was good. Scan locations included subfoveal. Central Foveal Thickness: 282. Progression has improved. Findings include abnormal foveal contour.    Notes OD with no active maculopathy  OS with less macular edema nasal to the fovea, center involvement continues to remain stable, repeat intravitreal Avastin today and consider focal laser treatment in the left eye soon  Intravitreal Injection, Pharmacologic Agent - OS - Left Eye       Time Out 10/14/2020. 9:14 AM. Confirmed correct patient, procedure, site, and patient consented.   Anesthesia Topical anesthesia was used. Anesthetic medications included Akten 3.5%.   Procedure Preparation included Ofloxacin , Tobramycin 0.3%, 10% betadine to eyelids, 5% betadine to ocular surface. A supplied needle was used.   Injection:  1.25 mg Bevacizumab (AVASTIN) SOLN   NDC: 70360-001-02, Lot: [id number]   Route: Intravitreal, Site: Left Eye, Waste: 0 mg  Post-op Post injection exam found visual acuity of at least counting fingers. The patient tolerated the procedure well. There were no complications. The patient received written and verbal post procedure care education. Post injection medications were not given.                 ASSESSMENT/PLAN:  Diabetic macular edema of left eye with proliferative retinopathy associated with type 2 diabetes mellitus (HCC) Less CME, CSME nasal to the fovea post Avastin No. 1, no center involvement, will repeat intravitreal Avastin OS today and treat with focal to the nasal region for noncenter involved CSME      ICD-10-CM   1. Diabetic macular edema of left eye with proliferative retinopathy associated with type 2 diabetes mellitus (HCC)  V77.9390 OCT, Retina - OU - Both Eyes    Intravitreal Injection, Pharmacologic Agent - OS - Left Eye    Bevacizumab (AVASTIN) SOLN 1.25 mg  2. Pseudophakia of right eye  Z96.1   3. Right epiretinal membrane  H35.371     1.  Repeat intravitreal Avastin OS today to control noncenter involved CSME.  2.  Dilate OS in 2 weeks for focal laser treatment guided by OCT for residual CSME nasal to  fovea.  3.  Ophthalmic Meds Ordered this visit:  Meds ordered this encounter  Medications  . Bevacizumab (AVASTIN) SOLN 1.25 mg       Return in about 2 weeks (around 10/28/2020) for dilate, OS, OCT, FOCAL.  There are no Patient Instructions on file for this visit.   Explained the diagnoses, plan, and follow up with the patient and they expressed understanding.  Patient expressed understanding of the importance of proper follow up care.   Clent Demark Itamar Mcgowan M.D. Diseases & Surgery of the Retina and Vitreous Retina & Diabetic [place] 10/14/20     Abbreviations: M myopia (nearsighted); A astigmatism; H hyperopia (farsighted); P presbyopia; Mrx spectacle prescription;  CTL contact lenses; OD right eye; OS left eye; OU both eyes  XT exotropia; ET esotropia; PEK punctate epithelial keratitis; PEE punctate epithelial erosions; DES dry eye syndrome; MGD meibomian gland dysfunction; ATs artificial tears; PFAT's preservative free artificial tears; [place] nuclear sclerotic cataract; PSC posterior subcapsular cataract; ERM epi-retinal membrane; PVD posterior vitreous detachment; RD retinal detachment; DM diabetes mellitus; DR diabetic retinopathy; NPDR non-proliferative diabetic retinopathy; PDR proliferative diabetic retinopathy; CSME clinically significant macular edema; DME diabetic macular edema; dbh dot blot hemorrhages; CWS cotton wool spot; POAG primary open angle glaucoma; C/D cup-to-disc ratio; HVF humphrey visual field; GVF goldmann visual field; OCT optical coherence tomography; IOP intraocular pressure; BRVO Branch retinal vein occlusion; CRVO central retinal vein occlusion; CRAO central retinal artery occlusion; BRAO branch retinal artery occlusion; RT retinal tear; SB scleral buckle; PPV pars plana vitrectomy; VH Vitreous hemorrhage; PRP panretinal laser photocoagulation; IVK intravitreal kenalog; VMT vitreomacular traction; MH Macular hole;  NVD neovascularization of the disc; NVE  neovascularization elsewhere; AREDS age related eye disease study; ARMD age related macular degeneration; POAG  primary open angle glaucoma; EBMD epithelial/anterior basement membrane dystrophy; ACIOL anterior chamber intraocular lens; IOL intraocular lens; PCIOL posterior chamber intraocular lens; Phaco/IOL phacoemulsification with intraocular lens placement; [place] photorefractive keratectomy; LASIK laser assisted in situ keratomileusis; HTN hypertension; DM diabetes mellitus; COPD chronic obstructive pulmonary disease

## 2020-10-14 NOTE — ED Triage Notes (Signed)
Stated he fell forward while going inside the store and hit his head on the glass door on the way down. Reported on plavix. Denies LOC. C/o head and neck pain.

## 2020-10-14 NOTE — Assessment & Plan Note (Signed)
Less CME, CSME nasal to the fovea post Avastin No. 1, no center involvement, will repeat intravitreal Avastin OS today and treat with focal to the nasal region for noncenter involved CSME

## 2020-10-15 MED ORDER — TETANUS-DIPHTH-ACELL PERTUSSIS 5-2.5-18.5 LF-MCG/0.5 IM SUSY
0.5000 mL | PREFILLED_SYRINGE | Freq: Once | INTRAMUSCULAR | Status: AC
  Administered 2020-10-15: 0.5 mL via INTRAMUSCULAR
  Filled 2020-10-15: qty 0.5

## 2020-10-15 NOTE — ED Provider Notes (Addendum)
[facility] EMERGENCY DEPARTMENT Provider Note   CSN: [id number] Arrival date & time: 10/14/20  1737     History Chief Complaint  Patient presents with  . Fall    Perry Thomas is a 59 y.o. male with a history of T2DM, ESRD S/p renal transplant, hypertension, and peripheral vascular disease who presents to the emergency department status post mechanical fall at 1700 this afternoon with complaints of mild head and neck pain.  Patient states that he was walking into a store and tripped falling forward striking his head and subsequently falling to the ground.  He denies loss of consciousness.  He initially felt okay so he went home, however upon returning home he developed a mild headache as well as some pain to the right side of his neck prompting ER visit.  He did scrape his left elbow and his knees.  He denies acute visual disturbance, numbness, tingling, weakness, chest pain, abdominal pain, vomiting, or seizure activity.  He is on Plavix.  HPI     Past Medical History:  Diagnosis Date  . Diabetes mellitus without complication ([place])   . ESRD on dialysis ([place])   . Hypertension   . Hypertension associated with diabetes ([place]) 01/30/2016  . Nuclear sclerotic cataract of right eye 08/25/2020  . PAD (peripheral artery disease) ([place])   . Posterior subcapsular age-related cataract, right eye 08/25/2020  . Type 2 diabetes mellitus ([place]) 01/30/2016    Patient Active Problem List   Diagnosis Date Noted  . Pseudophakia of right eye 10/14/2020  . Diabetic macular edema of left eye with proliferative retinopathy associated with type 2 diabetes mellitus ([place]) 08/25/2020  . Old retinal detachment, total/subtotal, left 08/25/2020  . Primary open angle glaucoma of both eyes, moderate stage 08/25/2020  . Right epiretinal membrane 08/25/2020  . Exposure to COVID-19 virus 06/07/2019  . Atypical chest pain 05/30/2019  . Erectile dysfunction 05/03/2018  . Elevated PSA, less than 10  ng/ml 03/31/2017  . Rhinitis, allergic 07/14/2016  . Healthcare maintenance 03/12/2016  . ESRD (end stage renal disease) on dialysis ([place]) 01/30/2016  . Peripheral vascular disease ([place]) 01/30/2016    Past Surgical History:  Procedure Laterality Date  . PTCA     peripheral artery disease       Family History  Problem Relation Age of Onset  . Healthy Mother   . Liver cancer Father   . Kidney disease Sister     Social History   Tobacco Use  . Smoking status: Former Smoker    Quit date: 12/13/2009    Years since quitting: 10.8  . Smokeless tobacco: Never Used  Vaping Use  . Vaping Use: Never used  Substance Use Topics  . Alcohol use: No    Alcohol/week: 0.0 standard drinks  . Drug use: No    Home Medications Prior to Admission medications   Medication Sig Start Date End Date Taking? Authorizing Provider  amLODipine (NORVASC) 10 MG tablet Take by mouth. 08/06/19   [provider]  aspirin 81 MG EC tablet Take by mouth. 09/05/19   [provider]  carvedilol (COREG) 12.5 MG tablet Take by mouth. 03/06/20   [provider]  clopidogrel (PLAVIX) 75 MG tablet Take 1 tablet (75 mg total) by mouth daily. 05/29/19   Mosetta Anis, MD  Continuous Blood Gluc Sensor (DEXCOM G6 SENSOR) MISC Apply 1 sensor to the skin every 10 days for continuous glucose monitoring. 05/15/20   [provider]  Continuous Blood Gluc  Transmit (DEXCOM G6 TRANSMITTER) MISC USE AS DIRECTED FOR CONTINUOUS GLUCOSE MONITORING. REUSE TRANSMITTER FOR 90 DAYS THEN DISCARD AND REPLACE. 04/28/20   [provider]  DUREZOL 0.05 % EMUL INSTILL 1 DROP INTO LEFT EYE 2 TIMES WEEKLY 09/26/18   [provider]  Exenatide ER 2 MG/0.85ML AUIJ Inject into the skin. 05/15/20   [provider]  furosemide (LASIX) 80 MG tablet Take 1/2 tablet (40 mg) daily as needed; 03/17/20   [provider]  insulin glargine (LANTUS SOLOSTAR) 100 UNIT/ML Solostar Pen Inject into  the skin. 05/15/20   [provider]  insulin lispro (HUMALOG) 100 UNIT/ML KwikPen Take 6 units with breakfast, 5 units with lunch and dinner 08/29/19   [provider]  Lancets (ONETOUCH ULTRASOFT) lancets Use to check blood sugar before breakfast and before supper. E11.65 05/15/20   [provider]  mycophenolate (MYFORTIC) 180 MG EC tablet Take by mouth. 12/04/19   [provider]  pantoprazole (PROTONIX) 40 MG tablet Take by mouth. 01/29/20   [provider]  prednisoLONE acetate (PRED FORTE) 1 % ophthalmic suspension Apply 3 drops to each nostril twice daily through 03/13/20 then decrease use to once daily. 03/14/20   [provider]  predniSONE (DELTASONE) 5 MG tablet Take by mouth. 04/03/20   [provider]  [place] 1-0.2 % SUSP INSTILL 1 DROP INTO LEFT EYE TWICE A DAY 09/06/17   [provider]  tacrolimus (PROGRAF) 1 MG capsule Take by mouth. 05/01/20   [provider]    Allergies    Patient has no known allergies.  Review of Systems   Review of Systems  Constitutional: Negative for chills and fever.  Eyes: Negative for visual disturbance.  Respiratory: Negative for shortness of breath.   Cardiovascular: Negative for chest pain.  Gastrointestinal: Negative for abdominal pain and vomiting.  Musculoskeletal: Positive for neck pain. Negative for back pain.  Skin: Positive for wound.  Neurological: Positive for headaches. Negative for seizures, syncope, weakness and numbness.  All other systems reviewed and are negative.   Physical Exam Updated Vital Signs BP 130/75   Pulse 81   Temp 98.3 F (36.8 C) (Oral)   Resp 17   SpO2 96%   Physical Exam Vitals and nursing note reviewed.  Constitutional:      General: He is not in acute distress.    Appearance: He is well-developed. He is not toxic-appearing.  HENT:     Head:     Comments: Left parietal scalp with some mild swelling.  No open wounds.  No  significant tenderness to palpation.  No raccoon eyes or battle sign.    Ears:     Comments: No hemotympanum.  Eyes:     General:        Right eye: No discharge.        Left eye: No discharge.     Conjunctiva/sclera: Conjunctivae normal.  Neck:     Comments: C-collar initially in place, removed, no midline spinal tenderness, mild right paraspinal muscle tenderness to palpation.  Intact active range of motion. Cardiovascular:     Rate and Rhythm: Normal rate and regular rhythm.     Pulses:          Radial pulses are 2+ on the right side and 2+ on the left side.       Posterior tibial pulses are 2+ on the right side and 2+ on the left side.  Pulmonary:     Effort: Pulmonary effort is  normal. No respiratory distress.     Breath sounds: Normal breath sounds. No wheezing, rhonchi or rales.  Chest:     Chest wall: No tenderness.  Abdominal:     General: There is no distension.     Palpations: Abdomen is soft.     Tenderness: There is no abdominal tenderness. There is no guarding or rebound.  Musculoskeletal:     Cervical back: Neck supple.     Comments: Patient has abrasions to his left posterior elbow as well as the bilateral anterior knees.  None are significantly deep.  No active bleeding.  No ecchymosis, obvious deformities, or erythema/increased warmth.  He has intact active range of motion throughout the bilateral upper and lower extremities.  No focal bony tenderness. Back: No midline tenderness or palpable step-off.  Skin:    General: Skin is warm and dry.     Findings: No rash.  Neurological:     Mental Status: He is alert.     Comments: Clear speech.  Sensation grossly tact bilateral upper and lower extremities.  5 out of 5 symmetric grip strength.  5-5 strength with plantar dorsiflexion bilaterally.  Psychiatric:        Behavior: Behavior normal.     ED Results / Procedures / Treatments   Labs (all labs ordered are listed, but only abnormal results are displayed) Labs  Reviewed - No data to display  EKG None  Radiology CT Head Wo Contrast  Result Date: 10/14/2020 CLINICAL DATA:  Head trauma, minor, normal mental status. Additional provided: Fall forward, hitting head, patient reports right-sided neck pain. EXAM: CT HEAD WITHOUT CONTRAST CT CERVICAL SPINE WITHOUT CONTRAST TECHNIQUE: Multidetector CT imaging of the head and cervical spine was performed following the standard protocol without intravenous contrast. Multiplanar CT image reconstructions of the cervical spine were also generated. COMPARISON:  CT head/cervical spine 03/11/2008. FINDINGS: CT HEAD FINDINGS Brain: Cerebral volume is normal for age. There is no acute intracranial hemorrhage. No demarcated cortical infarct. No extra-axial fluid collection. No evidence of intracranial mass. No midline shift. Vascular: No hyperdense vessel.  Atherosclerotic calcifications. Skull: Normal. Negative for fracture or focal lesion. Sinuses/Orbits: Visualized orbits show no acute finding. Mild ethmoid, sphenoid and maxillary sinus mucosal thickening. No significant mastoid effusion. Other: Nonspecific small focus of scalp induration overlying the left parietooccipital calvarium, unchanged as compared to the head CT of 03/11/2008. Left parietal scalp soft tissue swelling. CT CERVICAL SPINE FINDINGS Alignment: Straightening of the expected cervical lordosis. No significant spondylolisthesis. Skull base and vertebrae: The basion-dental and atlanto-dental intervals are maintained.No evidence of acute fracture to the cervical spine. Soft tissues and spinal canal: No prevertebral fluid or swelling. No visible canal hematoma. Disc levels: Cervical spondylosis with multilevel disc space narrowing, disc bulges and uncovertebral hypertrophy. 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 Prominent bridging left ventrolateral osteophytes at the C5-T1 levels. Somewhat prominent ventral osteophytes are also present at  C3-C4 and C4-C5. No high-grade spinal canal stenosis. Upper chest: No consolidation within the imaged lung apices. No visible pneumothorax. IMPRESSION: CT head: 1. No evidence of acute intracranial abnormality. 2. Left parietal scalp soft tissue swelling. 3. Mild paranasal sinus mucosal thickening. CT cervical spine: 1. No evidence of acute fracture to the cervical spine. 2. 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 Electronically Signed   By: Kellie Simmering DO   On: 10/14/2020 19:10   CT Cervical Spine Wo Contrast  Result Date: 10/14/2020 CLINICAL DATA:  Head trauma, minor,  normal mental status. Additional provided: Fall forward, hitting head, patient reports right-sided neck pain. EXAM: CT HEAD WITHOUT CONTRAST CT CERVICAL SPINE WITHOUT CONTRAST TECHNIQUE: Multidetector CT imaging of the head and cervical spine was performed following the standard protocol without intravenous contrast. Multiplanar CT image reconstructions of the cervical spine were also generated. COMPARISON:  CT head/cervical spine 03/11/2008. FINDINGS: CT HEAD FINDINGS Brain: Cerebral volume is normal for age. There is no acute intracranial hemorrhage. No demarcated cortical infarct. No extra-axial fluid collection. No evidence of intracranial mass. No midline shift. Vascular: No hyperdense vessel.  Atherosclerotic calcifications. Skull: Normal. Negative for fracture or focal lesion. Sinuses/Orbits: Visualized orbits show no acute finding. Mild ethmoid, sphenoid and maxillary sinus mucosal thickening. No significant mastoid effusion. Other: Nonspecific small focus of scalp induration overlying the left parietooccipital calvarium, unchanged as compared to the head CT of 03/11/2008. Left parietal scalp soft tissue swelling. CT CERVICAL SPINE FINDINGS Alignment: Straightening of the expected cervical lordosis. No significant spondylolisthesis. Skull base and vertebrae: The basion-dental and  atlanto-dental intervals are maintained.No evidence of acute fracture to the cervical spine. Soft tissues and spinal canal: No prevertebral fluid or swelling. No visible canal hematoma. Disc levels: Cervical spondylosis with multilevel disc space narrowing, disc bulges and uncovertebral hypertrophy. 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 Prominent bridging left ventrolateral osteophytes at the C5-T1 levels. Somewhat prominent ventral osteophytes are also present at C3-C4 and C4-C5. No high-grade spinal canal stenosis. Upper chest: No consolidation within the imaged lung apices. No visible pneumothorax. IMPRESSION: CT head: 1. No evidence of acute intracranial abnormality. 2. Left parietal scalp soft tissue swelling. 3. Mild paranasal sinus mucosal thickening. CT cervical spine: 1. No evidence of acute fracture to the cervical spine. 2. 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 Electronically Signed   By: Kellie Simmering DO   On: 10/14/2020 19:10   Intravitreal Injection, Pharmacologic Agent - OS - Left Eye  Result Date: 10/14/2020 Time Out 10/14/2020. 9:14 AM. Confirmed correct patient, procedure, site, and patient consented. Anesthesia Topical anesthesia was used. Anesthetic medications included Akten 3.5%. Procedure Preparation included Ofloxacin , Tobramycin 0.3%, 10% betadine to eyelids, 5% betadine to ocular surface. A supplied needle was used. Injection: 1.25 mg Bevacizumab (AVASTIN) SOLN   NDC: 70360-001-02, Lot: [id number]   Route: Intravitreal, Site: Left Eye, Waste: 0 mg Post-op Post injection exam found visual acuity of at least counting fingers. The patient tolerated the procedure well. There were no complications. The patient received written and verbal post procedure care education. Post injection medications were not given.   OCT, Retina - OU - Both Eyes  Result Date: 10/14/2020 Right Eye Quality was good. Scan locations  included subfoveal. Central Foveal Thickness: 254. Progression has been stable. Findings include normal observations. Left Eye Quality was good. Scan locations included subfoveal. Central Foveal Thickness: 282. Progression has improved. Findings include abnormal foveal contour. Notes OD with no active maculopathy OS with less macular edema nasal to the fovea, center involvement continues to remain stable, repeat intravitreal Avastin today and consider focal laser treatment in the left eye soon   Procedures Procedures (including critical care time)  Medications Ordered in ED Medications  Tdap (BOOSTRIX) injection 0.5 mL (has no administration in time range)    ED Course  I have reviewed the triage vital signs and the nursing notes.  Pertinent labs & imaging results that were available during my care of the patient were reviewed by me and considered in my medical  decision making (see chart for details).    MDM Rules/Calculators/A&P                         Patient presents to the ED status post mechanical fall.  He is nontoxic, his vitals are without significant abnormality.  Additional history obtained:  Additional history obtained from chart review nursing note reviewed.   Imaging Studies ordered:  CT head and C-spine were ordered per triage protocol, I independently visualized and interpreted imaging which showed no acute intracranial abnormality or acute spinal fracture.  No signs of serious head, neck, or back injury.  CT head and C-spine without head bleed or fracture.  No midline spinal tenderness.  No focal neurologic deficits.  Chest and abdomen are nontender.  Left elbow and bilateral knees have abrasions, however patient has good intact active range of motion and no focal bony tenderness, I have a low suspicion for fracture or dislocation.  He is neurovascularly intact distally.  Will update tetanus in the ER as patient is unsure of last tetanus dose.  Recommended application of  heat to the neck as needed for discomfort as well as taking Tylenol per over-the-counter dosing. I discussed results, treatment plan, need for follow-up, and return precautions with the patient. Provided opportunity for questions, patient confirmed understanding and is in agreement with plan.    Portions of this note were generated with [employer]. Dictation errors may occur despite best attempts at proofreading.  Final Clinical Impression(s) / ED Diagnoses Final diagnoses:  Fall, initial encounter  Injury of head, initial encounter  Abrasions of multiple sites    Rx / DC Orders ED Discharge Orders    None       Amaryllis Dyke, PA-C 10/15/20 0023    Amaryllis Dyke, PA-C 10/15/20 0023    Deno Etienne, DO 10/15/20 0025

## 2020-10-15 NOTE — Discharge Instructions (Addendum)
You were seen in the emergency department today after a fall.  Your CT scan did not show any brain bleed or spinal fracture, I did show some degenerative changes in your neck.  Please take Tylenol per over-the-counter dosing to help with discomfort.  You may apply a heating pad to the right side of your neck to help with this as well.  We have updated your tetanus shot today.  Please follow-up with your primary care provider within 3 days for general reevaluation.  Return to the ER for new or worsening symptoms or any other concerns.

## 2020-10-23 ENCOUNTER — Ambulatory Visit (INDEPENDENT_AMBULATORY_CARE_PROVIDER_SITE_OTHER): Payer: Medicaid Other | Admitting: Internal Medicine

## 2020-10-23 ENCOUNTER — Encounter: Payer: Self-pay | Admitting: Internal Medicine

## 2020-10-23 ENCOUNTER — Other Ambulatory Visit: Payer: Self-pay

## 2020-10-23 VITALS — BP 142/73 | HR 87 | Temp 97.6°F | Ht 69.0 in | Wt 239.3 lb

## 2020-10-23 DIAGNOSIS — Z940 Kidney transplant status: Secondary | ICD-10-CM | POA: Diagnosis not present

## 2020-10-23 DIAGNOSIS — I10 Essential (primary) hypertension: Secondary | ICD-10-CM | POA: Diagnosis not present

## 2020-10-23 DIAGNOSIS — Z794 Long term (current) use of insulin: Secondary | ICD-10-CM | POA: Diagnosis not present

## 2020-10-23 DIAGNOSIS — E1122 Type 2 diabetes mellitus with diabetic chronic kidney disease: Secondary | ICD-10-CM

## 2020-10-23 DIAGNOSIS — I739 Peripheral vascular disease, unspecified: Secondary | ICD-10-CM | POA: Diagnosis not present

## 2020-10-23 DIAGNOSIS — Z1211 Encounter for screening for malignant neoplasm of colon: Secondary | ICD-10-CM | POA: Diagnosis not present

## 2020-10-23 DIAGNOSIS — Z0000 Encounter for general adult medical examination without abnormal findings: Secondary | ICD-10-CM | POA: Diagnosis not present

## 2020-10-23 LAB — POCT GLYCOSYLATED HEMOGLOBIN (HGB A1C): Hemoglobin A1C: 6.2 % — AB (ref 4.0–5.6)

## 2020-10-23 LAB — GLUCOSE, CAPILLARY: Glucose-Capillary: 151 mg/dL — ABNORMAL HIGH (ref 70–99)

## 2020-10-23 MED ORDER — TRULICITY 0.75 MG/0.5ML ~~LOC~~ SOAJ: 0.7500 mg | SUBCUTANEOUS | 2 refills | Status: DC

## 2020-10-23 MED ORDER — CARVEDILOL 12.5 MG PO TABS: 12.5000 mg | ORAL_TABLET | Freq: Two times a day (BID) | ORAL | 11 refills | Status: AC

## 2020-10-23 MED ORDER — CLOPIDOGREL BISULFATE 75 MG PO TABS: 75.0000 mg | ORAL_TABLET | Freq: Every day | ORAL | 3 refills | Status: AC

## 2020-10-23 MED ORDER — AMLODIPINE BESYLATE 10 MG PO TABS: 10.0000 mg | ORAL_TABLET | Freq: Every day | ORAL | 11 refills | Status: AC

## 2020-10-23 NOTE — Assessment & Plan Note (Addendum)
Perry Thomas endorses a chronic history of bilateral lower extremity swelling that is worsened throughout the day and does resolve overnight.  He denies any orthopnea, difficulty breathing.  He denies any pain in his legs with walking like he did in the past when he had severe PAD prior to surgical intervention.  He denies any trouble taking his Plavix daily.  Assessment/plan: Consistent with vascular congestion given that its worse throughout the day and improves overnight.  No suspicion for heart failure at this time.    -Recommended he keep his legs elevated when he is resting.  -Continue aspirin and Plavix daily

## 2020-10-23 NOTE — Patient Instructions (Signed)
It was nice seeing you today! Thank you for choosing [facility] for your Primary Care.    Today we talked about:   Diabetes:   You can finish the Bydureon you have at home. Make sure to take it once per week.  Afterwards, please pick up the new medication called Trulicity at the pharmacy. This medication is also once   per week.   Continue taking Lantus 12 units before bedtime.   Discontinue using Humalog at this time.   Blood pressure: This is well controlled at this time. Continue taking Amlodipine and Coreg daily as instructed.   COVID-19 vaccine: I would recommend going ahead with the vaccine.    Let's follow up in 3 months

## 2020-10-23 NOTE — Assessment & Plan Note (Signed)
Patient requests colonoscopy referral

## 2020-10-23 NOTE — Progress Notes (Signed)
Internal Medicine Clinic Attending  Case discussed with Dr. Basaraba  At the time of the visit.  We reviewed the resident's history and exam and pertinent patient test results.  I agree with the assessment, diagnosis, and plan of care documented in the resident's note.  

## 2020-10-23 NOTE — Assessment & Plan Note (Signed)
Perry Thomas states that his current regimen includes exenatide that is prescribed his once weekly, however he states he sometimes takes it just once a month.  He states he does this because he read somewhere that was okay.  He is also taking Lantus 12 units every night before bedtime.  He is no longer taking his Humalog daily.  He has a Dexcom that he wears all day without difficulty.  He notes that his morning sugars range between 1 20-1 60 and occasionally his sugars go above 200 after he eats.  He notes that occasionally he has had some low sugars in the mornings that make him feel dizzy and sweaty, and was worried this were from the exenatide.  Assessment/plan: Lab Results  Component Value Date   HGBA1C 6.2 (A) 10/23/2020   Dexcom results reviewed with an average glucose of 172 with a standard deviation of 35.  He is within range 58% of the time with 41% of the time is high.  He does have some dips in his glucose occasionally in the past 14 days, with the lowest being approximately 75.  His A1c was evaluated today and doing very well.  I suspect his lows may be due to the Lantus and I explained to [name] the importance of taking his GLP-1 consistently.  He notes that his ultimate goal is to that 1 day be only on one medication for his diabetes.  Given the cardiovascular benefits of Trulicity and is similar once a week dosing, will switch from exenatide to Trulicity.  Perry Thomas expresses understanding and is in agreement with the plan.  For now we will continue with Lantus 12 units, but emphasized to patient that if his sugar is low prior to bedtime to take 8 units instead.  -Discontinue exenatide -Begin Trulicity 3.35 mg once a week -Continue Lantus 12 units before bedtime -Continue Dexcom -90-month follow-up

## 2020-10-23 NOTE — Progress Notes (Signed)
   CC: T2DM, HTN  HPI:  Perry Thomas is a 59 y.o. with a PMHx as listed below who presents to the clinic for T2DM, HTN.   Please see the Encounters tab for problem-based Assessment & Plan regarding status of patient's acute and chronic conditions.  Past Medical History:  Diagnosis Date  . Atypical chest pain 05/30/2019  . Diabetes mellitus without complication ([place])   . Elevated PSA, less than 10 ng/ml 03/31/2017  . ESRD (end stage renal disease) on dialysis ([place]) 01/30/2016  . ESRD on dialysis ([place])   . Hypertension   . Hypertension associated with diabetes ([place]) 01/30/2016  . Nuclear sclerotic cataract of right eye 08/25/2020  . PAD (peripheral artery disease) ([place])   . Posterior subcapsular age-related cataract, right eye 08/25/2020  . Type 2 diabetes mellitus ([place]) 01/30/2016   Review of Systems: Review of Systems  Constitutional: Negative for chills, fever and weight loss.  Respiratory: Negative for cough, shortness of breath and wheezing.   Cardiovascular: Positive for leg swelling. Negative for chest pain and palpitations.  Gastrointestinal: Negative for abdominal pain, diarrhea, nausea and vomiting.  Genitourinary: Negative for dysuria, flank pain, frequency, hematuria and urgency.  Musculoskeletal: Negative for joint pain and myalgias.  Neurological: Negative for dizziness, focal weakness and headaches.   Physical Exam:  Vitals:   10/23/20 1032  BP: (!) 142/73  Pulse: 87  Temp: 97.6 F (36.4 C)  TempSrc: Oral  SpO2: 100%  Weight: 239 lb 4.8 oz (108.5 kg)  Height: 5\' 9"  (1.753 m)   Physical Exam Vitals and nursing note reviewed.  Constitutional:      General: He is not in acute distress.    Appearance: He is obese.  Cardiovascular:     Rate and Rhythm: Normal rate and regular rhythm.     Heart sounds: No murmur heard.   Pulmonary:     Effort: Pulmonary effort is normal. No respiratory distress.     Breath sounds: No wheezing, rhonchi or rales.  Abdominal:       General: Bowel sounds are normal. There is no distension.     Palpations: Abdomen is soft.     Tenderness: There is no abdominal tenderness. There is no guarding.  Musculoskeletal:     Right lower leg: 1+ Pitting Edema present.     Left lower leg: 1+ Pitting Edema present.  Skin:    General: Skin is warm and dry.  Neurological:     General: No focal deficit present.     Mental Status: He is alert and oriented to person, place, and time. Mental status is at baseline.     Gait: Gait normal.  Psychiatric:        Mood and Affect: Mood normal.        Behavior: Behavior normal.    Assessment & Plan:   See Encounters Tab for problem based charting.  Patient discussed with Dr. Philipp Ovens

## 2020-10-23 NOTE — Assessment & Plan Note (Signed)
Mr. Abraha states he has been doing well since his renal transplant approximately 1 year ago.  He continues to follow-up with the transplant center.  His current immunosuppressants include tacrolimus, mycophenolate, and prednisone.

## 2020-10-23 NOTE — Assessment & Plan Note (Signed)
BP: (!) 142/73  Current regimen includes carvedilol 12.5 mg twice daily and Norvasc 10 mg daily.  Mr. Villavicencio states he has no difficulty with his current regimen.  He denies any chest pain, shortness of breath.  Assessment/plan: Blood pressure is slightly above goal at this time, however at previous visits recently it was closer to goal.  At this time we will continue with current medication regimen and reassess at next visit.  -Refilled amlodipine, carvedilol

## 2020-10-28 ENCOUNTER — Encounter (INDEPENDENT_AMBULATORY_CARE_PROVIDER_SITE_OTHER): Payer: Self-pay | Admitting: Ophthalmology

## 2020-10-28 ENCOUNTER — Ambulatory Visit (INDEPENDENT_AMBULATORY_CARE_PROVIDER_SITE_OTHER): Payer: Medicaid Other | Admitting: Ophthalmology

## 2020-10-28 ENCOUNTER — Other Ambulatory Visit: Payer: Self-pay

## 2020-10-28 DIAGNOSIS — E113512 Type 2 diabetes mellitus with proliferative diabetic retinopathy with macular edema, left eye: Secondary | ICD-10-CM | POA: Diagnosis not present

## 2020-10-28 DIAGNOSIS — E113511 Type 2 diabetes mellitus with proliferative diabetic retinopathy with macular edema, right eye: Secondary | ICD-10-CM | POA: Insufficient documentation

## 2020-10-28 NOTE — Assessment & Plan Note (Signed)
Slightly increased temporal and inferonasal today and new, will deliver focal laser treatment temporally for noncentral involved CSME soon

## 2020-10-28 NOTE — Assessment & Plan Note (Signed)
Focal laser delivered nasal to the fovea today

## 2020-10-28 NOTE — Progress Notes (Signed)
10/28/2020     CHIEF COMPLAINT Patient presents for Retina Follow Up   HISTORY OF PRESENT ILLNESS: Perry Thomas is a 59 y.o. male who presents to the clinic today for:   HPI    Retina Follow Up    Patient presents with  Diabetic Retinopathy.  In left eye.  This started 2 weeks ago.  Severity is mild.  Duration of 2 weeks.  Since onset it is stable.          Comments    2 Wk Focal OS  Vision stable, no FF, no pain.   Last BS: 146 this AM        Last edited by Nichola Sizer D on 10/28/2020  8:26 AM. (History)      Referring physician: No referring provider defined for this encounter.  HISTORICAL INFORMATION:   Selected notes from the MEDICAL RECORD NUMBER    Lab Results  Component Value Date   HGBA1C 6.2 (A) 10/23/2020     CURRENT MEDICATIONS: Current Outpatient Medications (Ophthalmic Drugs)  Medication Sig  . DUREZOL 0.05 % EMUL INSTILL 1 DROP INTO LEFT EYE 2 TIMES WEEKLY  . prednisoLONE acetate (PRED FORTE) 1 % ophthalmic suspension Apply 3 drops to each nostril twice daily through 03/13/20 then decrease use to once daily.  Marland Kitchen SIMBRINZA 1-0.2 % SUSP INSTILL 1 DROP INTO LEFT EYE TWICE A DAY   No current facility-administered medications for this visit. (Ophthalmic Drugs)   Current Outpatient Medications (Other)  Medication Sig  . amLODipine (NORVASC) 10 MG tablet Take 1 tablet (10 mg total) by mouth daily.  Marland Kitchen aspirin 81 MG EC tablet Take by mouth.  . carvedilol (COREG) 12.5 MG tablet Take 1 tablet (12.5 mg total) by mouth 2 (two) times daily with a meal.  . clopidogrel (PLAVIX) 75 MG tablet Take 1 tablet (75 mg total) by mouth daily.  . Continuous Blood Gluc Sensor (DEXCOM G6 SENSOR) MISC Apply 1 sensor to the skin every 10 days for continuous glucose monitoring.  . Continuous Blood Gluc Transmit (DEXCOM G6 TRANSMITTER) MISC USE AS DIRECTED FOR CONTINUOUS GLUCOSE MONITORING. REUSE TRANSMITTER FOR 90 DAYS THEN DISCARD AND REPLACE.  . Dulaglutide  (TRULICITY) 2.94 TM/5.4YT SOPN Inject 0.75 mg into the skin once a week.  . furosemide (LASIX) 80 MG tablet Take 1/2 tablet (40 mg) daily as needed;  . insulin glargine (LANTUS SOLOSTAR) 100 UNIT/ML Solostar Pen Inject into the skin.  . Lancets (ONETOUCH ULTRASOFT) lancets Use to check blood sugar before breakfast and before supper. E11.65  . mycophenolate (MYFORTIC) 180 MG EC tablet Take by mouth.  . pantoprazole (PROTONIX) 40 MG tablet Take by mouth.  . predniSONE (DELTASONE) 5 MG tablet Take by mouth.  . tacrolimus (PROGRAF) 1 MG capsule Take by mouth.   No current facility-administered medications for this visit. (Other)      REVIEW OF SYSTEMS:    ALLERGIES No Known Allergies  PAST MEDICAL HISTORY Past Medical History:  Diagnosis Date  . Atypical chest pain 05/30/2019  . Diabetes mellitus without complication ([place])   . Elevated PSA, less than 10 ng/ml 03/31/2017  . ESRD (end stage renal disease) on dialysis ([place]) 01/30/2016  . ESRD on dialysis ([place])   . Hypertension   . Hypertension associated with diabetes ([place]) 01/30/2016  . Nuclear sclerotic cataract of right eye 08/25/2020  . PAD (peripheral artery disease) ([place])   . Posterior subcapsular age-related cataract, right eye 08/25/2020  . Type 2 diabetes mellitus ([place]) 01/30/2016  Past Surgical History:  Procedure Laterality Date  . PTCA     peripheral artery disease    FAMILY HISTORY Family History  Problem Relation Age of Onset  . Healthy Mother   . Liver cancer Father   . Kidney disease Sister     SOCIAL HISTORY Social History   Tobacco Use  . Smoking status: Former Smoker    Quit date: 12/13/2009    Years since quitting: 10.8  . Smokeless tobacco: Never Used  Vaping Use  . Vaping Use: Never used  Substance Use Topics  . Alcohol use: No    Alcohol/week: 0.0 standard drinks  . Drug use: No         OPHTHALMIC EXAM:  Base Eye Exam    Visual Acuity (ETDRS)      Right Left   Dist [place] 20/25 20/60 -2    Dist ph [place]  20/50 +2       Tonometry (Tonopen, 8:31 AM)      Right Left   Pressure 18 21       Pupils      Pupils Dark Light Shape React APD   Right PERRL 4 4 Round Minimal None   Left PERRL 4 4 Round Minimal None       Visual Fields (Counting fingers)      Left Right    Full        Extraocular Movement      Right Left    Full Full       Neuro/Psych    Oriented x3: Yes   Mood/Affect: Normal       Dilation    Left eye: 1.0% Mydriacyl, 2.5% Phenylephrine @ 8:31 AM        Slit Lamp and Fundus Exam    External Exam      Right Left   External Normal Normal          IMAGING AND PROCEDURES  Imaging and Procedures for 10/28/20  OCT, Retina - OU - Both Eyes       Right Eye Quality was good. Scan locations included subfoveal. Central Foveal Thickness: 269. Progression has worsened. Findings include abnormal foveal contour.   Left Eye Quality was good. Scan locations included subfoveal. Central Foveal Thickness: 285. Progression has been stable. Findings include abnormal foveal contour.   Notes Minor CSME temporal and inferonasal to the fovea OD  OS with active CSME nasal to the fovea noncentral involved, will deliver focal treatment today       Focal Laser - OS - Left Eye       Time Out Confirmed correct patient, procedure, site, and patient consented.   Anesthesia Topical anesthesia was used. Anesthetic medications included Proparacaine 0.5%.   Laser Information The type of laser was diode. Color was yellow. The duration in seconds was 0.1. The spot size was 100 microns. Laser power was 50. Total spots was 63.   Post-op The patient tolerated the procedure well. There were no complications. The patient received written and verbal post procedure care education.   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 Diabetic macular edema of right eye with proliferative retinopathy associated with type 2 diabetes mellitus ([place])  E11.3511     1.  Goal OU is decreased treatment burden with intravitreal antivegF, focal laser delivered OS today  2.  Visit in 2 to 3 weeks for focal laser OD  3.  Ophthalmic Meds Ordered this visit:  No orders of the defined types were placed in this encounter.      Return in about 2 weeks (around 11/11/2020) for dilate, OCT, FOCAL, OD.  There are no Patient Instructions on file for this visit.   Explained the diagnoses, plan, and follow up with the patient and they expressed understanding.  Patient expressed understanding of the importance of proper follow up care.   Clent Demark Amarrion Pastorino M.D. Diseases & Surgery of the Retina and Vitreous Retina & Diabetic [place] 10/28/20     Abbreviations: M myopia (nearsighted); A astigmatism; H hyperopia (farsighted); P presbyopia; Mrx spectacle prescription;  CTL contact lenses; OD right eye; OS left eye; OU both eyes  XT exotropia; ET esotropia; PEK punctate epithelial keratitis; PEE punctate epithelial erosions; DES dry eye syndrome; MGD meibomian gland dysfunction; ATs artificial tears; PFAT's preservative free artificial tears; [place] nuclear sclerotic cataract; PSC posterior subcapsular cataract; ERM epi-retinal membrane; PVD posterior vitreous detachment; RD retinal detachment; DM diabetes mellitus; DR  diabetic retinopathy; NPDR non-proliferative diabetic retinopathy; PDR proliferative diabetic retinopathy; CSME clinically significant macular edema; DME diabetic macular edema; dbh dot blot hemorrhages; CWS cotton wool spot; POAG primary open angle glaucoma; C/D cup-to-disc ratio; HVF humphrey visual field; GVF goldmann visual field; OCT optical coherence tomography; IOP intraocular pressure; BRVO Branch retinal vein occlusion; CRVO central retinal vein occlusion; CRAO central retinal artery occlusion; BRAO branch retinal artery occlusion; RT retinal tear; SB scleral buckle; PPV pars plana vitrectomy; VH Vitreous hemorrhage; PRP panretinal laser photocoagulation; IVK intravitreal kenalog; VMT vitreomacular traction; MH Macular hole;  NVD neovascularization of the disc; NVE neovascularization elsewhere; AREDS age related eye disease study; ARMD age related macular degeneration; POAG primary open angle glaucoma; EBMD epithelial/anterior basement membrane dystrophy; ACIOL anterior chamber intraocular lens; IOL intraocular lens; PCIOL posterior chamber intraocular lens; Phaco/IOL phacoemulsification with intraocular lens placement; [place] photorefractive keratectomy; LASIK laser assisted in situ keratomileusis; HTN hypertension; DM diabetes mellitus; COPD chronic obstructive pulmonary disease

## 2020-11-13 ENCOUNTER — Encounter (INDEPENDENT_AMBULATORY_CARE_PROVIDER_SITE_OTHER): Payer: Self-pay | Admitting: Ophthalmology

## 2020-11-13 ENCOUNTER — Other Ambulatory Visit: Payer: Self-pay

## 2020-11-13 ENCOUNTER — Ambulatory Visit (INDEPENDENT_AMBULATORY_CARE_PROVIDER_SITE_OTHER): Payer: Medicaid Other | Admitting: Ophthalmology

## 2020-11-13 DIAGNOSIS — E113511 Type 2 diabetes mellitus with proliferative diabetic retinopathy with macular edema, right eye: Secondary | ICD-10-CM | POA: Diagnosis not present

## 2020-11-13 NOTE — Progress Notes (Addendum)
11/13/2020     CHIEF COMPLAINT Patient presents for Retina Follow Up   HISTORY OF PRESENT ILLNESS: Perry Thomas is a 59 y.o. male who presents to the clinic today for:   HPI    Retina Follow Up    Patient presents with  Diabetic Retinopathy.  In right eye.  This started 2 weeks ago.  Severity is mild.  Duration of 2 weeks.  Since onset it is stable.          Comments    2 Week Focal OD  Pt c/o watering OU. No changes to [place] OU. LBS: 200 this AM       Last edited by Rockie Neighbours, [place] on 11/13/2020  9:01 AM. (History)      Referring physician: Mosetta Anis, MD [address],  [place] [address]  HISTORICAL INFORMATION:   Selected notes from the MEDICAL RECORD NUMBER    Lab Results  Component Value Date   HGBA1C 6.2 (A) 10/23/2020     CURRENT MEDICATIONS: Current Outpatient Medications (Ophthalmic Drugs)  Medication Sig  . SIMBRINZA 1-0.2 % SUSP INSTILL 1 DROP INTO LEFT EYE TWICE A DAY  . DUREZOL 0.05 % EMUL INSTILL 1 DROP INTO LEFT EYE 2 TIMES WEEKLY (Patient not taking: Reported on 11/13/2020)  . prednisoLONE acetate (PRED FORTE) 1 % ophthalmic suspension Apply 3 drops to each nostril twice daily through 03/13/20 then decrease use to once daily.   No current facility-administered medications for this visit. (Ophthalmic Drugs)   Current Outpatient Medications (Other)  Medication Sig  . amLODipine (NORVASC) 10 MG tablet Take 1 tablet (10 mg total) by mouth daily.  Marland Kitchen aspirin 81 MG EC tablet Take by mouth.  . carvedilol (COREG) 12.5 MG tablet Take 1 tablet (12.5 mg total) by mouth 2 (two) times daily with a meal.  . clopidogrel (PLAVIX) 75 MG tablet Take 1 tablet (75 mg total) by mouth daily.  . Continuous Blood Gluc Sensor (DEXCOM G6 SENSOR) MISC Apply 1 sensor to the skin every 10 days for continuous glucose monitoring.  . Continuous Blood Gluc Transmit (DEXCOM G6 TRANSMITTER) MISC USE AS DIRECTED FOR CONTINUOUS GLUCOSE MONITORING. REUSE TRANSMITTER FOR 90  DAYS THEN DISCARD AND REPLACE.  . Dulaglutide (TRULICITY) 6.04 VW/0.9WJ SOPN Inject 0.75 mg into the skin once a week.  . furosemide (LASIX) 80 MG tablet Take 1/2 tablet (40 mg) daily as needed;  . insulin glargine (LANTUS SOLOSTAR) 100 UNIT/ML Solostar Pen Inject into the skin.  . Lancets (ONETOUCH ULTRASOFT) lancets Use to check blood sugar before breakfast and before supper. E11.65  . mycophenolate (MYFORTIC) 180 MG EC tablet Take by mouth.  . pantoprazole (PROTONIX) 40 MG tablet Take by mouth.  . predniSONE (DELTASONE) 5 MG tablet Take by mouth.  . tacrolimus (PROGRAF) 1 MG capsule Take by mouth.   No current facility-administered medications for this visit. (Other)      REVIEW OF SYSTEMS:    ALLERGIES No Known Allergies  PAST MEDICAL HISTORY Past Medical History:  Diagnosis Date  . Atypical chest pain 05/30/2019  . Diabetes mellitus without complication ([place])   . Elevated PSA, less than 10 ng/ml 03/31/2017  . ESRD (end stage renal disease) on dialysis ([place]) 01/30/2016  . ESRD on dialysis ([place])   . Hypertension   . Hypertension associated with diabetes ([place]) 01/30/2016  . Nuclear sclerotic cataract of right eye 08/25/2020  . PAD (peripheral artery disease) ([place])   . Posterior subcapsular age-related cataract, right eye 08/25/2020  .  Type 2 diabetes mellitus ([place]) 01/30/2016   Past Surgical History:  Procedure Laterality Date  . PTCA     peripheral artery disease    FAMILY HISTORY Family History  Problem Relation Age of Onset  . Healthy Mother   . Liver cancer Father   . Kidney disease Sister     SOCIAL HISTORY Social History   Tobacco Use  . Smoking status: Former Smoker    Quit date: 12/13/2009    Years since quitting: 10.9  . Smokeless tobacco: Never Used  Vaping Use  . Vaping Use: Never used  Substance Use Topics  . Alcohol use: No    Alcohol/week: 0.0 standard drinks  . Drug use: No         OPHTHALMIC EXAM:  Base Eye Exam    Visual Acuity  (ETDRS)      Right Left   Dist [place] 20/20 -2 20/60   Dist ph [place]  20/50 +1       Tonometry (Tonopen, 9:02 AM)      Right Left   Pressure 14 17       Pupils      Pupils Dark Light Shape React APD   Right PERRL 6 5 Round Slow None   Left PERRL 6 5 Round Slow None       Visual Fields (Counting fingers)      Left Right    Full Full       Extraocular Movement      Right Left    Full Full       Neuro/Psych    Oriented x3: Yes   Mood/Affect: Normal       Dilation    Right eye: 1.0% Mydriacyl, 2.5% Phenylephrine @ 9:05 AM          IMAGING AND PROCEDURES  Imaging and Procedures for 11/13/20  OCT, Retina - OU - Both Eyes       Right Eye Quality was good. Scan locations included subfoveal. Central Foveal Thickness: 272. Progression has been stable. Findings include abnormal foveal contour.   Left Eye Quality was good. Scan locations included subfoveal. Central Foveal Thickness: 300. Progression has worsened. Findings include abnormal foveal contour.   Notes OD with focal CSME temporally which will be treated today with focal laser   Asked with focal CSME nasal to the fovea which is transiently worse on thickness yet 2 weeks post focal laser, will continue to observe       Focal Laser - OD - Right Eye       Time Out Confirmed correct patient, procedure, site, and patient consented.   Anesthesia Topical anesthesia was used. Anesthetic medications included Proparacaine 0.5%.   Laser Information The type of laser was diode. Color was yellow. The duration in seconds was 0.1. The spot size was 100 microns. Laser power was 50. Total spots was 82.   Post-op The patient tolerated the procedure well. There were no complications. The patient received written and verbal post procedure care education.   Notes Pt tolerated procedure, no complication                ASSESSMENT/PLAN:  No problem-specific Assessment & Plan notes found for this encounter.       ICD-10-CM   1. Diabetic macular edema of right eye with proliferative retinopathy associated with type 2 diabetes mellitus (HCC)  E11.3511 OCT, Retina - OU - Both Eyes    Focal Laser - OD - Right Eye    1.  Focal laser treatment completed today temporal to the fovea OD, no complications  2.  CSME OS, still active nasal to the fovea but only 2 weeks post focal, will likely need repeat evaluation in approximately 5 weeks and likely need antivegF repeated to bring this condition under control left eye  3.  Ophthalmic Meds Ordered this visit:  No orders of the defined types were placed in this encounter.      Return in about 5 weeks (around 12/18/2020) for dilate, OS, AVASTIN OCT.  There are no Patient Instructions on file for this visit.   Explained the diagnoses, plan, and follow up with the patient and they expressed understanding.  Patient expressed understanding of the importance of proper follow up care.   Clent Demark Melvia Matousek M.D. Diseases & Surgery of the Retina and Vitreous Retina & Diabetic [place] 11/13/20     Abbreviations: M myopia (nearsighted); A astigmatism; H hyperopia (farsighted); P presbyopia; Mrx spectacle prescription;  CTL contact lenses; OD right eye; OS left eye; OU both eyes  XT exotropia; ET esotropia; PEK punctate epithelial keratitis; PEE punctate epithelial erosions; DES dry eye syndrome; MGD meibomian gland dysfunction; ATs artificial tears; PFAT's preservative free artificial tears; [place] nuclear sclerotic cataract; PSC posterior subcapsular cataract; ERM epi-retinal membrane; PVD posterior vitreous detachment; RD retinal detachment; DM diabetes mellitus; DR diabetic retinopathy; NPDR non-proliferative diabetic retinopathy; PDR proliferative diabetic retinopathy; CSME clinically significant macular edema; DME diabetic macular edema; dbh dot blot hemorrhages; CWS cotton wool spot; POAG primary open angle glaucoma; C/D cup-to-disc ratio; HVF humphrey visual  field; GVF goldmann visual field; OCT optical coherence tomography; IOP intraocular pressure; BRVO Branch retinal vein occlusion; CRVO central retinal vein occlusion; CRAO central retinal artery occlusion; BRAO branch retinal artery occlusion; RT retinal tear; SB scleral buckle; PPV pars plana vitrectomy; VH Vitreous hemorrhage; PRP panretinal laser photocoagulation; IVK intravitreal kenalog; VMT vitreomacular traction; MH Macular hole;  NVD neovascularization of the disc; NVE neovascularization elsewhere; AREDS age related eye disease study; ARMD age related macular degeneration; POAG primary open angle glaucoma; EBMD epithelial/anterior basement membrane dystrophy; ACIOL anterior chamber intraocular lens; IOL intraocular lens; PCIOL posterior chamber intraocular lens; Phaco/IOL phacoemulsification with intraocular lens placement; [place] photorefractive keratectomy; LASIK laser assisted in situ keratomileusis; HTN hypertension; DM diabetes mellitus; COPD chronic obstructive pulmonary disease

## 2020-11-14 ENCOUNTER — Telehealth: Payer: Self-pay

## 2020-11-14 NOTE — Telephone Encounter (Signed)
Pt stated that his blood sugar is spiking and he does not know what to do he is needing a call back asap.

## 2020-11-14 NOTE — Telephone Encounter (Signed)
Call to patient because his blood sugars have "been spiking higher" for last two weeks. More in the 200-300s rather than his usual 100s. He was asked to upload his Dexcom, but he said he doe snot know how to work his email and can get someone to help him if we mail information.   Covid shot 2 weeks ago and next vaccination due on Dec 11th  Blood sugar readings: 309 last nigth at bedtime- took lantus 12 units 233 when he woke up, took 5 u humalog ate breakfast piece half fish leftover, tsp brown rice, loradine low sugar cookie 213- 1200 / 207 right now  He denies changes in his medication or food intake other than he took his last dose of Bydureon last Friday and wonders if she should take the Trulciity today.  Symptoms- he denies  Fever, chills, cough, increased urination, he endorses changes in stools- increased from 2x/day to 3x/day and  Softer, tired, increased thirst He has restarted  -Humalog 5 units with meals 3x/day,  lantus 12 at bedtime.   Educated that his higher blood sugar may be due to his Covid vaccination and should subside soon.   Agreed to continue Lantus 12 units daily, start Trulicity as ordered today, stop Humalog for 2/3 blood sugars <150 mg/dl, call for any blood sugar <80 mg/dl . He repeated back the directions and reports an adequate supply of Humalog.  Debera Lat, RD 11/14/2020 2:00 PM.

## 2020-11-18 ENCOUNTER — Encounter (INDEPENDENT_AMBULATORY_CARE_PROVIDER_SITE_OTHER): Payer: Self-pay

## 2020-11-18 ENCOUNTER — Other Ambulatory Visit: Payer: Self-pay

## 2020-11-18 ENCOUNTER — Encounter: Payer: Self-pay | Admitting: Podiatry

## 2020-11-18 ENCOUNTER — Ambulatory Visit (INDEPENDENT_AMBULATORY_CARE_PROVIDER_SITE_OTHER): Payer: Medicaid Other | Admitting: Podiatry

## 2020-11-18 DIAGNOSIS — E119 Type 2 diabetes mellitus without complications: Secondary | ICD-10-CM | POA: Diagnosis not present

## 2020-11-18 DIAGNOSIS — N186 End stage renal disease: Secondary | ICD-10-CM

## 2020-11-18 DIAGNOSIS — B351 Tinea unguium: Secondary | ICD-10-CM | POA: Diagnosis not present

## 2020-11-18 DIAGNOSIS — M79676 Pain in unspecified toe(s): Secondary | ICD-10-CM

## 2020-11-18 DIAGNOSIS — D689 Coagulation defect, unspecified: Secondary | ICD-10-CM

## 2020-11-18 NOTE — Progress Notes (Signed)
Patient ID: Perry Thomas, male   DOB: 05-03-1961, 59 y.o.   MRN: [id number] Complaint:  Visit Type: Patient returns to my office for continued preventative foot care services. Complaint: Patient states" my nails have grown long and thick and become painful to walk and wear shoes" Patient has been diagnosed with DM with no foot complications.  Patient has history of kidney transplant.. The patient presents for preventative foot care services. No changes to ROS.  Patient is taking plavix which causes coagulation defect.    Podiatric Exam: Vascular: dorsalis pedis and posterior tibial pulses are palpable bilateral. Capillary return is immediate. Temperature gradient is WNL. Skin turgor WNL  Sensorium: Normal Semmes Weinstein monofilament test. Normal tactile sensation bilaterally. Nail Exam: Pt has thick disfigured discolored nails with subungual debris noted bilateral entire nail hallux through fifth toenails Ulcer Exam: There is no evidence of ulcer or pre-ulcerative changes or infection. Orthopedic Exam: Muscle tone and strength are WNL. No limitations in general ROM. No crepitus or effusions noted. Foot type and digits show no abnormalities. Bony prominences are unremarkable. Skin:  Porokeratosis sub 5 left.. No infection or ulcers  Diagnosis:  Onychomycosis, , Pain in right toe, pain in left toes    Treatment & Plan Procedures and Treatment: Consent by patient was obtained for treatment procedures. The patient understood the discussion of treatment and procedures well. All questions were answered thoroughly reviewed. Debridement of mycotic and hypertrophic toenails, 1 through 5 bilateral and clearing of subungual debris. No ulceration, no infection noted.  Return Visit-Office Procedure: Patient instructed to return to the office for a follow up visit 10 weeks  for continued evaluation and treatment.    Gardiner Barefoot DPM

## 2020-11-19 ENCOUNTER — Ambulatory Visit: Payer: Medicaid Other | Admitting: Podiatry

## 2020-12-08 ENCOUNTER — Other Ambulatory Visit: Payer: Self-pay

## 2020-12-08 MED ORDER — LANTUS SOLOSTAR 100 UNIT/ML ~~LOC~~ SOPN: 12.0000 [IU] | PEN_INJECTOR | Freq: Every day | SUBCUTANEOUS | 0 refills | Status: DC

## 2020-12-08 NOTE — Telephone Encounter (Signed)
insulin glargine (LANTUS SOLOSTAR) 100 UNIT/ML Solostar Pen, REFILL REQUEST @  [id number] [place],  - [place] Phone:  [phone]  Fax:  [phone]     Requesting 90 days supply. Pt would like a call back.

## 2020-12-10 ENCOUNTER — Other Ambulatory Visit: Payer: Self-pay

## 2020-12-10 MED ORDER — DEXCOM G6 SENSOR MISC: 3 refills | Status: DC

## 2020-12-10 NOTE — Telephone Encounter (Signed)
Pt is requesting his Continuous Blood Gluc Sensor (DEXCOM G6 SENSOR) MISC sent to  [id number] - [place], [place] Phone:  [phone]  Fax:  [phone]

## 2020-12-18 ENCOUNTER — Ambulatory Visit (INDEPENDENT_AMBULATORY_CARE_PROVIDER_SITE_OTHER): Payer: Medicaid Other | Admitting: Ophthalmology

## 2020-12-18 ENCOUNTER — Encounter (INDEPENDENT_AMBULATORY_CARE_PROVIDER_SITE_OTHER): Payer: Self-pay | Admitting: Ophthalmology

## 2020-12-18 ENCOUNTER — Other Ambulatory Visit: Payer: Self-pay

## 2020-12-18 DIAGNOSIS — E113512 Type 2 diabetes mellitus with proliferative diabetic retinopathy with macular edema, left eye: Secondary | ICD-10-CM | POA: Diagnosis not present

## 2020-12-18 DIAGNOSIS — E113511 Type 2 diabetes mellitus with proliferative diabetic retinopathy with macular edema, right eye: Secondary | ICD-10-CM

## 2020-12-18 MED ORDER — BEVACIZUMAB 2.5 MG/0.1ML IZ SOSY
2.5000 mg | PREFILLED_SYRINGE | INTRAVITREAL | Status: AC | PRN
  Administered 2020-12-18: 2.5 mg via INTRAVITREAL

## 2020-12-18 NOTE — Assessment & Plan Note (Signed)
Status post focal laser 10/28/2020 left eye, still with persistent CSME.  Will need additional intravitreal Avastin today left eye

## 2020-12-18 NOTE — Assessment & Plan Note (Signed)
Improved and stable CSME post focal laser treatment in the past

## 2020-12-18 NOTE — Progress Notes (Signed)
12/18/2020     CHIEF COMPLAINT Patient presents for Retina Follow Up (5 WK FU OS, POSS AVASTIN OS///Pt reports stable vision OS, no new F/F OS, no pain or pressure OS.////Last A1C: 6.2   10/2020////Last BS: 145 this AM )   HISTORY OF PRESENT ILLNESS: Perry Thomas is a 60 y.o. male who presents to the clinic today for:   HPI    Retina Follow Up    In left eye.  This started 5 weeks ago.  Duration of 5 weeks.  Since onset it is stable. Additional comments: 5 WK FU OS, POSS AVASTIN OS   Pt reports stable vision OS, no new F/F OS, no pain or pressure OS.    Last A1C: 6.2   10/2020    Last BS: 145 this AM        Last edited by Nichola Sizer D on 12/18/2020  9:02 AM. (History)      Referring physician: Mosetta Anis, MD [address],  [place] [address]  HISTORICAL INFORMATION:   Selected notes from the MEDICAL RECORD NUMBER    Lab Results  Component Value Date   HGBA1C 6.2 (A) 10/23/2020     CURRENT MEDICATIONS: Current Outpatient Medications (Ophthalmic Drugs)  Medication Sig  . SIMBRINZA 1-0.2 % SUSP INSTILL 1 DROP INTO LEFT EYE TWICE A DAY  . DUREZOL 0.05 % EMUL INSTILL 1 DROP INTO LEFT EYE 2 TIMES WEEKLY (Patient not taking: No sig reported)  . prednisoLONE acetate (PRED FORTE) 1 % ophthalmic suspension Apply 3 drops to each nostril twice daily through 03/13/20 then decrease use to once daily. (Patient not taking: Reported on 12/18/2020)   No current facility-administered medications for this visit. (Ophthalmic Drugs)   Current Outpatient Medications (Other)  Medication Sig  . amLODipine (NORVASC) 10 MG tablet Take 1 tablet (10 mg total) by mouth daily.  Marland Kitchen aspirin 81 MG EC tablet Take by mouth.  . carvedilol (COREG) 12.5 MG tablet Take 1 tablet (12.5 mg total) by mouth 2 (two) times daily with a meal.  . clopidogrel (PLAVIX) 75 MG tablet Take 1 tablet (75 mg total) by mouth daily.  . Continuous Blood Gluc Sensor (DEXCOM G6 SENSOR) MISC Apply 1 sensor  to the skin every 10 days for continuous glucose monitoring.  . Continuous Blood Gluc Transmit (DEXCOM G6 TRANSMITTER) MISC USE AS DIRECTED FOR CONTINUOUS GLUCOSE MONITORING. REUSE TRANSMITTER FOR 90 DAYS THEN DISCARD AND REPLACE.  . Dulaglutide (TRULICITY) 6.26 RS/8.5IO SOPN Inject 0.75 mg into the skin once a week. (Patient not taking: Reported on 11/14/2020)  . furosemide (LASIX) 80 MG tablet Take 1/2 tablet (40 mg) daily as needed;  . insulin glargine (LANTUS SOLOSTAR) 100 UNIT/ML Solostar Pen Inject 12 Units into the skin daily.  . Lancets (ONETOUCH ULTRASOFT) lancets Use to check blood sugar before breakfast and before supper. E11.65  . mycophenolate (MYFORTIC) 180 MG EC tablet Take by mouth. 180 mg two tablets twice a day  . pantoprazole (PROTONIX) 40 MG tablet Take by mouth.  . predniSONE (DELTASONE) 5 MG tablet Take by mouth. (Patient not taking: Reported on 12/18/2020)  . sulfamethoxazole-trimethoprim (BACTRIM) 200-40 MG/5ML suspension Take by mouth 2 (two) times daily.  . tacrolimus (PROGRAF) 1 MG capsule Take by mouth.   No current facility-administered medications for this visit. (Other)      REVIEW OF SYSTEMS:    ALLERGIES No Known Allergies  PAST MEDICAL HISTORY Past Medical History:  Diagnosis Date  . Atypical chest pain 05/30/2019  .  Diabetes mellitus without complication ([place])   . Elevated PSA, less than 10 ng/ml 03/31/2017  . ESRD (end stage renal disease) on dialysis ([place]) 01/30/2016  . ESRD on dialysis ([place])   . Hypertension   . Hypertension associated with diabetes ([place]) 01/30/2016  . Nuclear sclerotic cataract of right eye 08/25/2020  . PAD (peripheral artery disease) ([place])   . Posterior subcapsular age-related cataract, right eye 08/25/2020  . Type 2 diabetes mellitus ([place]) 01/30/2016   Past Surgical History:  Procedure Laterality Date  . PTCA     peripheral artery disease    FAMILY HISTORY Family History  Problem Relation Age of Onset  . Healthy Mother    . Liver cancer Father   . Kidney disease Sister     SOCIAL HISTORY Social History   Tobacco Use  . Smoking status: Former Smoker    Quit date: 12/13/2009    Years since quitting: 11.0  . Smokeless tobacco: Never Used  Vaping Use  . Vaping Use: Never used  Substance Use Topics  . Alcohol use: No    Alcohol/week: 0.0 standard drinks  . Drug use: No         OPHTHALMIC EXAM: Base Eye Exam    Visual Acuity (ETDRS)      Right Left   Dist [place] 20/20 -2 20/60 +2   Dist ph [place]  20/50 +2       Tonometry (Tonopen, 9:10 AM)      Right Left   Pressure 24 23       Pupils      Pupils Dark Light Shape React APD   Right PERRL 6 5 Round Slow None   Left PERRL 6 5 Round Slow None       Visual Fields (Counting fingers)      Left Right    Full Full       Extraocular Movement      Right Left    Full Full       Neuro/Psych    Oriented x3: Yes   Mood/Affect: Normal        Slit Lamp and Fundus Exam    External Exam      Right Left   External Normal Normal       Slit Lamp Exam      Right Left   Lids/Lashes Normal Normal   Conjunctiva/Sclera White and quiet White and quiet   Cornea Clear Clear   Anterior Chamber Deep and quiet Deep and quiet   Iris Round and reactive Round and reactive   Lens Well centered multifocal IOL Posterior chamber intraocular lens, Centered posterior chamber intraocular lens   Anterior Vitreous Normal Small remnant of oil in the anterior hyaloid       Fundus Exam      Right Left   Posterior Vitreous  Clear posteriorly   Disc  1+ Optic disc atrophy, 1+ Pallor   C/D Ratio  0.85   Macula   Mild clinically significant macular edema nasal to FAZ, Microaneurysms   Vessels  PDR-quiet   Periphery  Good PRP peripherally          IMAGING AND PROCEDURES  Imaging and Procedures for 12/18/20  OCT, Retina - OU - Both Eyes       Right Eye Quality was good. Scan locations included subfoveal. Central Foveal Thickness: 264. Progression has  improved. Findings include abnormal foveal contour.   Left Eye Quality was good. Scan locations included subfoveal. Central Foveal Thickness: 322. Progression  has been stable. Findings include abnormal foveal contour.   Notes OD with focal CSME temporally which will be treated with focal laser   Asked with focal CSME nasal to the fovea which is transiently worse on thickness yet 8 weeks post focal laser, will treat OS today with intravitreal Avastin       Intravitreal Injection, Pharmacologic Agent - OS - Left Eye       Time Out 12/18/2020. 9:50 AM. Confirmed correct patient, procedure, site, and patient consented.   Anesthesia Topical anesthesia was used. Anesthetic medications included [place] 3.5%.   Procedure Preparation included Ofloxacin , Tobramycin 0.3%, 10% betadine to eyelids, 5% betadine to ocular surface. A supplied needle was used.   Injection:  2.5 mg Bevacizumab (AVASTIN) 2.5mg /0.75mL SOSY   NDC: 69678-938-10, Lot: [id number]   Route: Intravitreal, Site: Left Eye  Post-op Post injection exam found visual acuity of at least counting fingers. The patient tolerated the procedure well. There were no complications. The patient received written and verbal post procedure care education. Post injection medications were not given.                 ASSESSMENT/PLAN:  Diabetic macular edema of left eye with proliferative retinopathy associated with type 2 diabetes mellitus ([place]) Status post focal laser 10/28/2020 left eye, still with persistent CSME.  Will need additional intravitreal Avastin today left eye  Diabetic macular edema of right eye with proliferative retinopathy associated with type 2 diabetes mellitus ([place]) Improved and stable CSME post focal laser treatment in the past      ICD-10-CM   1. 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 Diabetic macular edema of right eye with proliferative retinopathy associated with type 2 diabetes mellitus ([place])  E11.3511     1.  CSME OD stable, no threat to center involvement, status post focal laser  2.  CSME OS, improved yet still active nasal to the fovea post focal and Avastin, 5 weeks post Avastin injection, repeat today  3.  Ophthalmic Meds Ordered this visit:  Meds ordered this encounter  Medications  . bevacizumab (AVASTIN) SOSY 2.5 mg       Return in about 6 weeks (around 01/29/2021) for dilate, OS, AVASTIN OCT.  There are no Patient Instructions on file for this visit.   Explained the diagnoses, plan, and follow up with the patient and they expressed understanding.  Patient expressed understanding of the importance of proper follow up care.   Clent Demark Jarian Longoria M.D. Diseases & Surgery of the Retina and Vitreous [facility]ina [facility] Diabetic [place] 12/18/20     Abbreviations: M myopia (nearsighted); A astigmatism; H hyperopia (farsighted); P presbyopia; Mrx spectacle prescription;  CTL contact lenses; OD right eye; OS left eye; OU both eyes  XT exotropia; ET esotropia; PEK punctate epithelial keratitis; PEE punctate epithelial erosions; DES dry eye syndrome; MGD meibomian gland dysfunction; ATs artificial tears; PFAT's preservative free artificial tears; [place] nuclear sclerotic cataract; PSC posterior subcapsular cataract; ERM epi-retinal membrane; PVD posterior vitreous detachment; RD retinal detachment; DM diabetes mellitus; DR diabetic retinopathy; NPDR non-proliferative diabetic retinopathy; PDR proliferative diabetic retinopathy; CSME clinically significant macular edema; DME diabetic macular edema; dbh dot blot hemorrhages; CWS cotton wool spot; POAG primary open angle glaucoma; C/D cup-to-disc ratio; HVF humphrey  visual field; GVF goldmann visual field; OCT optical coherence tomography;  IOP intraocular pressure; BRVO Branch retinal vein occlusion; CRVO central retinal vein occlusion; CRAO central retinal artery occlusion; BRAO branch retinal artery occlusion; RT retinal tear; SB scleral buckle; PPV pars plana vitrectomy; VH Vitreous hemorrhage; PRP panretinal laser photocoagulation; IVK intravitreal kenalog; VMT vitreomacular traction; MH Macular hole;  NVD neovascularization of the disc; NVE neovascularization elsewhere; AREDS age related eye disease study; ARMD age related macular degeneration; POAG primary open angle glaucoma; EBMD epithelial/anterior basement membrane dystrophy; ACIOL anterior chamber intraocular lens; IOL intraocular lens; PCIOL posterior chamber intraocular lens; Phaco/IOL phacoemulsification with intraocular lens placement; [place] photorefractive keratectomy; LASIK laser assisted in situ keratomileusis; HTN hypertension; DM diabetes mellitus; COPD chronic obstructive pulmonary disease

## 2020-12-22 ENCOUNTER — Other Ambulatory Visit: Payer: Self-pay

## 2020-12-22 MED ORDER — PANTOPRAZOLE SODIUM 40 MG PO TBEC: 40.0000 mg | DELAYED_RELEASE_TABLET | Freq: Every day | ORAL | 0 refills | Status: DC

## 2020-12-22 NOTE — Telephone Encounter (Signed)
  pantoprazole (PROTONIX) 40 MG tablet, REFILL REQUEST @  [id number] [address], [place] Phone:  [phone]  Fax:  [phone]

## 2021-01-06 ENCOUNTER — Other Ambulatory Visit: Payer: Self-pay | Admitting: Internal Medicine

## 2021-01-06 ENCOUNTER — Other Ambulatory Visit: Payer: Self-pay | Admitting: Student

## 2021-01-06 DIAGNOSIS — E1122 Type 2 diabetes mellitus with diabetic chronic kidney disease: Secondary | ICD-10-CM

## 2021-01-27 ENCOUNTER — Other Ambulatory Visit: Payer: Self-pay

## 2021-01-27 ENCOUNTER — Encounter: Payer: Self-pay | Admitting: Podiatry

## 2021-01-27 ENCOUNTER — Ambulatory Visit (INDEPENDENT_AMBULATORY_CARE_PROVIDER_SITE_OTHER): Payer: Medicaid Other | Admitting: Podiatry

## 2021-01-27 DIAGNOSIS — B351 Tinea unguium: Secondary | ICD-10-CM

## 2021-01-27 DIAGNOSIS — N186 End stage renal disease: Secondary | ICD-10-CM | POA: Diagnosis not present

## 2021-01-27 DIAGNOSIS — D689 Coagulation defect, unspecified: Secondary | ICD-10-CM

## 2021-01-27 DIAGNOSIS — E119 Type 2 diabetes mellitus without complications: Secondary | ICD-10-CM

## 2021-01-27 DIAGNOSIS — M79676 Pain in unspecified toe(s): Secondary | ICD-10-CM

## 2021-01-27 NOTE — Progress Notes (Signed)
Patient ID: Perry Thomas, male   DOB: [birth year], 60 y.o.   MRN: [id number] Complaint:  Visit Type: Patient returns to my office for continued preventative foot care services. Complaint: Patient states" my nails have grown long and thick and become painful to walk and wear shoes" Patient has been diagnosed with DM with no foot complications.  Patient has history of kidney transplant.. The patient presents for preventative foot care services. No changes to ROS.  Patient is taking plavix which causes coagulation defect.    Podiatric Exam: Vascular: dorsalis pedis and posterior tibial pulses are palpable bilateral. Capillary return is immediate. Temperature gradient is WNL. Skin turgor WNL  Sensorium: Normal Semmes Weinstein monofilament test. Normal tactile sensation bilaterally. Nail Exam: Pt has thick disfigured discolored nails with subungual debris noted bilateral entire nail hallux through fifth toenails Ulcer Exam: There is no evidence of ulcer or pre-ulcerative changes or infection. Orthopedic Exam: Muscle tone and strength are WNL. No limitations in general ROM. No crepitus or effusions noted. Foot type and digits show no abnormalities. Bony prominences are unremarkable. Skin:  Porokeratosis sub 5 left.. No infection or ulcers  Diagnosis:  Onychomycosis, , Pain in right toe, pain in left toes    Treatment & Plan Procedures and Treatment: Consent by patient was obtained for treatment procedures. The patient understood the discussion of treatment and procedures well. All questions were answered thoroughly reviewed. Debridement of mycotic and hypertrophic toenails, 1 through 5 bilateral and clearing of subungual debris. No ulceration, no infection noted.  Return Visit-Office Procedure: Patient instructed to return to the office for a follow up visit 10 weeks  for continued evaluation and treatment.    Gardiner Barefoot DPM

## 2021-01-29 ENCOUNTER — Other Ambulatory Visit: Payer: Self-pay

## 2021-01-29 ENCOUNTER — Ambulatory Visit (INDEPENDENT_AMBULATORY_CARE_PROVIDER_SITE_OTHER): Payer: Medicaid Other | Admitting: Ophthalmology

## 2021-01-29 ENCOUNTER — Encounter (INDEPENDENT_AMBULATORY_CARE_PROVIDER_SITE_OTHER): Payer: Self-pay | Admitting: Ophthalmology

## 2021-01-29 DIAGNOSIS — E113512 Type 2 diabetes mellitus with proliferative diabetic retinopathy with macular edema, left eye: Secondary | ICD-10-CM | POA: Diagnosis not present

## 2021-01-29 MED ORDER — DEXCOM G6 TRANSMITTER MISC: 0 refills | Status: DC

## 2021-01-29 MED ORDER — BEVACIZUMAB 2.5 MG/0.1ML IZ SOSY
2.5000 mg | PREFILLED_SYRINGE | INTRAVITREAL | Status: AC | PRN
  Administered 2021-01-29: 2.5 mg via INTRAVITREAL

## 2021-01-29 NOTE — Telephone Encounter (Signed)
Need refill on Continuous Blood Gluc Transmit (DEXCOM G6 TRANSMITTER) MISC  ;pt contact [phone]   [id number] [place] [facility] [address]

## 2021-01-29 NOTE — Assessment & Plan Note (Signed)
Diabetic macular edema left eye focal nasal to the fovea, on intravitreal Avastin 6 weeks prior as well as now 3 months post focal laser treatment in attempt to decrease treatment burden left eye.  Improved macular thickening on OCT evaluation today, Dilate OS next in 8 weeks

## 2021-01-29 NOTE — Progress Notes (Signed)
01/29/2021     CHIEF COMPLAINT Patient presents for Retina Follow Up (6 Week PDR f\u OS. Possible Avastin OS. OCT/Pt states vision is about the same since last visit. Pt states he has questions for Dr. Zadie Rhine /BGL: 197 this AM)   HISTORY OF PRESENT ILLNESS: Perry Thomas is a 60 y.o. male who presents to the clinic today for:   HPI    Retina Follow Up    Patient presents with  Diabetic Retinopathy.  In left eye.  Severity is moderate.  Duration of 6 weeks.  Since onset it is stable.  I, the attending physician,  performed the HPI with the patient and updated documentation appropriately. Additional comments: 6 Week PDR f\u OS. Possible Avastin OS. OCT Pt states vision is about the same since last visit. Pt states he has questions for Dr. Zadie Rhine  BGL: 197 this AM       Last edited by [name] on 01/29/2021  9:39 AM. (History)      Referring physician: Mosetta Anis, MD [address],  [place] [address]  HISTORICAL INFORMATION:   Selected notes from the MEDICAL RECORD NUMBER    Lab Results  Component Value Date   HGBA1C 6.2 (A) 10/23/2020     CURRENT MEDICATIONS: Current Outpatient Medications (Ophthalmic Drugs)  Medication Sig  . DUREZOL 0.05 % EMUL INSTILL 1 DROP INTO LEFT EYE 2 TIMES WEEKLY (Patient not taking: No sig reported)  . prednisoLONE acetate (PRED FORTE) 1 % ophthalmic suspension Apply 3 drops to each nostril twice daily through 03/13/20 then decrease use to once daily. (Patient not taking: Reported on 12/18/2020)  . SIMBRINZA 1-0.2 % SUSP INSTILL 1 DROP INTO LEFT EYE TWICE A DAY   No current facility-administered medications for this visit. (Ophthalmic Drugs)   Current Outpatient Medications (Other)  Medication Sig  . amLODipine (NORVASC) 10 MG tablet Take 1 tablet (10 mg total) by mouth daily.  Marland Kitchen aspirin 81 MG EC tablet Take by mouth.  . carvedilol (COREG) 12.5 MG tablet Take 1 tablet (12.5 mg total) by mouth 2 (two) times daily with a meal.   . clopidogrel (PLAVIX) 75 MG tablet Take 1 tablet (75 mg total) by mouth daily.  . Continuous Blood Gluc Sensor (DEXCOM G6 SENSOR) MISC Apply 1 sensor to the skin every 10 days for continuous glucose monitoring.  . Continuous Blood Gluc Transmit (DEXCOM G6 TRANSMITTER) MISC USE AS DIRECTED FOR CONTINUOUS GLUCOSE MONITORING. REUSE TRANSMITTER FOR 90 DAYS THEN DISCARD AND REPLACE.  . Dulaglutide (TRULICITY) A999333 0000000 SOPN Inject 0.75 mg into the skin once a week.  . furosemide (LASIX) 80 MG tablet Take 1/2 tablet (40 mg) daily as needed;  . insulin glargine (LANTUS SOLOSTAR) 100 UNIT/ML Solostar Pen Inject 12 Units into the skin daily.  . Lancets (ONETOUCH ULTRASOFT) lancets Use to check blood sugar before breakfast and before supper. E11.65  . mycophenolate (MYFORTIC) 180 MG EC tablet Take by mouth. 180 mg two tablets twice a day  . pantoprazole (PROTONIX) 40 MG tablet TAKE 1 TABLET BY MOUTH EVERY DAY  . predniSONE (DELTASONE) 5 MG tablet Take by mouth. (Patient not taking: Reported on 12/18/2020)  . sulfamethoxazole-trimethoprim (BACTRIM) 200-40 MG/5ML suspension Take by mouth 2 (two) times daily.  . tacrolimus (PROGRAF) 1 MG capsule Take by mouth.   No current facility-administered medications for this visit. (Other)      REVIEW OF SYSTEMS: ROS    Positive for: Endocrine   Last edited by Truddie Crumble,  Yetta Flock on 01/29/2021  9:38 AM. (History)       ALLERGIES No Known Allergies  PAST MEDICAL HISTORY Past Medical History:  Diagnosis Date  . Atypical chest pain 05/30/2019  . Diabetes mellitus without complication ([place])   . Elevated PSA, less than 10 ng/ml 03/31/2017  . ESRD (end stage renal disease) on dialysis ([place]) 01/30/2016  . ESRD on dialysis ([place])   . Hypertension   . Hypertension associated with diabetes ([place]) 01/30/2016  . Nuclear sclerotic cataract of right eye 08/25/2020  . PAD (peripheral artery disease) ([place])   . Posterior subcapsular age-related cataract, right eye  08/25/2020  . Type 2 diabetes mellitus ([place]) 01/30/2016   Past Surgical History:  Procedure Laterality Date  . PTCA     peripheral artery disease    FAMILY HISTORY Family History  Problem Relation Age of Onset  . Healthy Mother   . Liver cancer Father   . Kidney disease Sister     SOCIAL HISTORY Social History   Tobacco Use  . Smoking status: Former Smoker    Quit date: 12/13/2009    Years since quitting: 11.1  . Smokeless tobacco: Never Used  Vaping Use  . Vaping Use: Never used  Substance Use Topics  . Alcohol use: No    Alcohol/week: 0.0 standard drinks  . Drug use: No         OPHTHALMIC EXAM: Base Eye Exam    Visual Acuity (Snellen - Linear)      Right Left   Dist [place] 20/25 -1 20/50 +2   Dist ph [place]  20/40       Tonometry (Tonopen, 9:45 AM)      Right Left   Pressure 14 14       Pupils      Pupils Dark Light Shape React APD   Right PERRL 5 4 Round Sluggish None   Left PERRL 5 4 Round Sluggish None       Visual Fields (Counting fingers)      Left Right    Full Full       Neuro/Psych    Oriented x3: Yes   Mood/Affect: Normal       Dilation    Left eye: 1.0% Mydriacyl, 2.5% Phenylephrine @ 9:45 AM        Slit Lamp and Fundus Exam    External Exam      Right Left   External Normal Normal       Slit Lamp Exam      Right Left   Lids/Lashes Normal Normal   Conjunctiva/Sclera White and quiet White and quiet   Cornea Clear Clear   Anterior Chamber Deep and quiet Deep and quiet   Iris Round and reactive Round and reactive   Lens Well centered multifocal IOL Posterior chamber intraocular lens, Centered posterior chamber intraocular lens   Anterior Vitreous Normal Small remnant of oil in the anterior hyaloid       Fundus Exam      Right Left   Posterior Vitreous  Clear posteriorly   Disc  1+ Optic disc atrophy, 1+ Pallor   C/D Ratio  0.85   Macula   Mild clinically significant macular edema nasal to FAZ, Microaneurysms   Vessels   PDR-quiet   Periphery  Good PRP peripherally          IMAGING AND PROCEDURES  Imaging and Procedures for 01/29/21  OCT, Retina - OU - Both Eyes       Right  Eye Quality was good. Scan locations included subfoveal. Central Foveal Thickness: 264. Progression has improved. Findings include abnormal foveal contour.   Left Eye Quality was good. Scan locations included subfoveal. Central Foveal Thickness: 322. Progression has improved. Findings include abnormal foveal contour.   Notes Much improved CSME OD, will observe   CSME nasal to the fovea, nearly center involved, improved overall as compared to prior to focal laser and on intravitreal Avastin, will repeat injection today at 6-week interval and extend interval examination next 8 weeks OS       Intravitreal Injection, Pharmacologic Agent - OS - Left Eye       Time Out 01/29/2021. 10:12 AM. Confirmed correct patient, procedure, site, and patient consented.   Anesthesia Topical anesthesia was used. Anesthetic medications included [place] 3.5%.   Procedure Preparation included Ofloxacin , Tobramycin 0.3%, 10% betadine to eyelids, 5% betadine to ocular surface. A supplied needle was used.   Injection:  2.5 mg Bevacizumab (AVASTIN) 2.'5mg'$ /0.61m SOSY   NDC: [id number]  Route: Intravitreal, Site: Left Eye  Post-op Post injection exam found visual acuity of at least counting fingers. The patient tolerated the procedure well. There were no complications. The patient received written and verbal post procedure care education. Post injection medications were not given.                 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   Improved macular thickening on OCT evaluation today, Dilate OS next in 8 weeks      ICD-10-CM   1. 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  CSME improved overall over the last 6 weeks post Avastin.  We will repeat today.  2.  Patient had discussion about remnants of silicone oil left in his eye from previous surgery done elsewhere upon silicone oil removal.  I explained that is very rare to remove all silicone oil because of the crevices and hiding spaces inside the vitreous cavity.  Surgically under taking remembrance of the silicone oil is possible but not recommended less debilitating symptoms occur  3.  Ophthalmic Meds Ordered this visit:  Meds ordered this encounter  Medications  . bevacizumab (AVASTIN) SOSY 2.5 mg       Return in about 8 weeks (around 03/26/2021) for DILATE OU, AVASTIN OCT, OS.  There are no Patient Instructions on file for this visit.   Explained the diagnoses, plan, and follow up with the patient and they expressed understanding.  Patient expressed understanding of the importance of proper follow up care.   GClent DemarkRankin M.D. Diseases & Surgery of the Retina and Vitreous Retina & Diabetic [place]02/17/22     Abbreviations: M myopia (nearsighted); A astigmatism; H hyperopia (farsighted); P presbyopia; Mrx spectacle prescription;  CTL contact lenses; OD right eye; OS left eye; OU both eyes  XT exotropia; ET esotropia; PEK punctate epithelial keratitis; PEE punctate epithelial erosions; DES dry eye syndrome; MGD meibomian gland dysfunction; ATs artificial tears; PFAT's preservative  free artificial tears; [place] nuclear sclerotic cataract; PSC posterior subcapsular cataract; ERM epi-retinal membrane; PVD posterior vitreous detachment; RD  retinal detachment; DM diabetes mellitus; DR diabetic retinopathy; NPDR non-proliferative diabetic retinopathy; PDR proliferative diabetic retinopathy; CSME clinically significant macular edema; DME diabetic macular edema; dbh dot blot hemorrhages; CWS cotton wool spot; POAG primary open angle glaucoma; C/D cup-to-disc ratio; HVF humphrey visual field; GVF goldmann visual field; OCT optical coherence tomography; IOP intraocular pressure; BRVO Branch retinal vein occlusion; CRVO central retinal vein occlusion; CRAO central retinal artery occlusion; BRAO branch retinal artery occlusion; RT retinal tear; SB scleral buckle; PPV pars plana vitrectomy; VH Vitreous hemorrhage; PRP panretinal laser photocoagulation; IVK intravitreal kenalog; VMT vitreomacular traction; MH Macular hole;  NVD neovascularization of the disc; NVE neovascularization elsewhere; AREDS age related eye disease study; ARMD age related macular degeneration; POAG primary open angle glaucoma; EBMD epithelial/anterior basement membrane dystrophy; ACIOL anterior chamber intraocular lens; IOL intraocular lens; PCIOL posterior chamber intraocular lens; Phaco/IOL phacoemulsification with intraocular lens placement; [place] photorefractive keratectomy; LASIK laser assisted in situ keratomileusis; HTN hypertension; DM diabetes mellitus; COPD chronic obstructive pulmonary disease

## 2021-02-05 ENCOUNTER — Other Ambulatory Visit: Payer: Self-pay | Admitting: Internal Medicine

## 2021-02-16 ENCOUNTER — Encounter (INDEPENDENT_AMBULATORY_CARE_PROVIDER_SITE_OTHER): Payer: Self-pay

## 2021-02-25 ENCOUNTER — Telehealth: Payer: Self-pay | Admitting: Podiatry

## 2021-02-25 ENCOUNTER — Other Ambulatory Visit: Payer: Self-pay

## 2021-02-25 ENCOUNTER — Ambulatory Visit (INDEPENDENT_AMBULATORY_CARE_PROVIDER_SITE_OTHER): Payer: Medicaid Other

## 2021-02-25 ENCOUNTER — Ambulatory Visit: Payer: Medicaid Other | Admitting: Podiatry

## 2021-02-25 DIAGNOSIS — L97512 Non-pressure chronic ulcer of other part of right foot with fat layer exposed: Secondary | ICD-10-CM

## 2021-02-25 DIAGNOSIS — R238 Other skin changes: Secondary | ICD-10-CM

## 2021-02-25 DIAGNOSIS — E119 Type 2 diabetes mellitus without complications: Secondary | ICD-10-CM

## 2021-02-25 MED ORDER — DOXYCYCLINE HYCLATE 100 MG PO TABS: 100.0000 mg | ORAL_TABLET | Freq: Two times a day (BID) | ORAL | 0 refills | Status: DC

## 2021-02-25 NOTE — Telephone Encounter (Signed)
Called pt to schedule appt, after leaving message on nurse line about a sore on the bottom of his foot. He will be seeing Dr. Posey Pronto today at 10:45.

## 2021-03-03 ENCOUNTER — Encounter: Payer: Self-pay | Admitting: Podiatry

## 2021-03-03 ENCOUNTER — Other Ambulatory Visit: Payer: Self-pay | Admitting: Internal Medicine

## 2021-03-03 NOTE — Progress Notes (Signed)
Subjective:  Patient ID: Perry Thomas, male    DOB: 1961-03-08,  MRN: [id number]  Chief Complaint  Patient presents with  . Wound Check    Right foot has a small open area that the patient stated he noticed a few days ago. He stated that he has some tenderness     60 y.o. male presents for wound care.  Patient presents with complaint of right submetatarsal 4 ulceration with fat layer exposed.  Patient states he noticed the open sore few days ago has progressive gotten worse.  There is tenderness to it as well as my associate with it.  He does not want to lose any body parts.  He is a diabetic with last A1c of6.2.  He has not seen anyone else prior to seeing me.  He would like to get this treated immediately.  The wound is going deeper.  He denies any other acute complaints.  He does have history of peripheral artery disease as well.   Review of Systems: Negative except as noted in the HPI. Denies N/V/F/Ch.  Past Medical History:  Diagnosis Date  . Atypical chest pain 05/30/2019  . Diabetes mellitus without complication ([place])   . Elevated PSA, less than 10 ng/ml 03/31/2017  . ESRD (end stage renal disease) on dialysis ([place]) 01/30/2016  . ESRD on dialysis ([employer])   . Hypertension   . Hypertension associated with diabetes ([place]) 01/30/2016  . Nuclear sclerotic cataract of right eye 08/25/2020  . PAD (peripheral artery disease) ([place])   . Posterior subcapsular age-related cataract, right eye 08/25/2020  . Type 2 diabetes mellitus ([place]) 01/30/2016    Current Outpatient Medications:  .  doxycycline (VIBRA-TABS) 100 MG tablet, Take 1 tablet (100 mg total) by mouth 2 (two) times daily., Disp: 20 tablet, Rfl: 0 .  amLODipine (NORVASC) 10 MG tablet, Take 1 tablet (10 mg total) by mouth daily., Disp: 30 tablet, Rfl: 11 .  aspirin 81 MG EC tablet, Take by mouth., Disp: , Rfl:  .  carvedilol (COREG) 12.5 MG tablet, Take 1 tablet (12.5 mg total) by mouth 2 (two) times daily with a meal., Disp: 60 tablet,  Rfl: 11 .  clopidogrel (PLAVIX) 75 MG tablet, Take 1 tablet (75 mg total) by mouth daily., Disp: 90 tablet, Rfl: 3 .  Continuous Blood Gluc Sensor (DEXCOM G6 SENSOR) MISC, Apply 1 sensor to the skin every 10 days for continuous glucose monitoring., Disp: 4 each, Rfl: 3 .  Continuous Blood Gluc Transmit (DEXCOM G6 TRANSMITTER) MISC, USE AS DIRECTED FOR CONTINUOUS GLUCOSE MONITORING. REUSE TRANSMITTER FOR 90 DAYS THEN DISCARD AND REPLACE., Disp: 1 each, Rfl: 0 .  DUREZOL 0.05 % EMUL, INSTILL 1 DROP INTO LEFT EYE 2 TIMES WEEKLY (Patient not taking: No sig reported), Disp: , Rfl: 1 .  furosemide (LASIX) 80 MG tablet, Take 1/2 tablet (40 mg) daily as needed;, Disp: , Rfl:  .  insulin glargine (LANTUS SOLOSTAR) 100 UNIT/ML Solostar Pen, Inject 12 Units into the skin daily., Disp: 3.6 mL, Rfl: 0 .  Lancets (ONETOUCH ULTRASOFT) lancets, Use to check blood sugar before breakfast and before supper. E11.65, Disp: , Rfl:  .  mycophenolate (MYFORTIC) 180 MG EC tablet, Take by mouth. 180 mg two tablets twice a day, Disp: , Rfl:  .  pantoprazole (PROTONIX) 40 MG tablet, TAKE 1 TABLET BY MOUTH EVERY DAY, Disp: 30 tablet, Rfl: 1 .  prednisoLONE acetate (PRED FORTE) 1 % ophthalmic suspension, Apply 3 drops to each nostril twice daily through 03/13/20 then  decrease use to once daily. (Patient not taking: Reported on 12/18/2020), Disp: , Rfl:  .  predniSONE (DELTASONE) 5 MG tablet, Take by mouth. (Patient not taking: Reported on 12/18/2020), Disp: , Rfl:  .  SIMBRINZA 1-0.2 % SUSP, INSTILL 1 DROP INTO LEFT EYE TWICE A DAY, Disp: , Rfl: 4 .  sulfamethoxazole-trimethoprim (BACTRIM) 200-40 MG/5ML suspension, Take by mouth 2 (two) times daily., Disp: , Rfl:  .  tacrolimus (PROGRAF) 1 MG capsule, Take by mouth., Disp: , Rfl:   Social History   Tobacco Use  Smoking Status Former Smoker  . Quit date: 12/13/2009  . Years since quitting: 11.2  Smokeless Tobacco Never Used    No Known Allergies Objective:  There were no vitals  filed for this visit. There is no height or weight on file to calculate BMI. Constitutional Well developed. Well nourished.  Vascular Dorsalis pedis pulses non palpable bilaterally. Posterior tibial pulses non palpable bilaterally. Capillary refill normal to all digits.  No cyanosis or clubbing noted. Pedal hair growth normal.  Neurologic Normal speech. Oriented to person, place, and time. Protective sensation absent  Dermatologic Wound Location: Right submetatarsal 4 with fat layer exposed.  Some malodor present.  No purulent drainage noted.  Does not probe down to bone.  Probes down to deep tissue.  No cellulitis noted. Wound Base: Mixed Granular/Fibrotic Peri-wound: Calloused Exudate: Scant/small amount Serous exudate Wound Measurements: -None  Orthopedic: No pain to palpation either foot.   Radiographs: 3 views of skeletally mature the right foot: No osteomyelitis noted.  No soft tissue emphysema noted.  No fractures noted. Assessment:   1. Diabetes mellitus without complication ([place])   2. Foot ulceration, right, with fat layer exposed ([place])    Plan:  Patient was evaluated and treated and all questions answered.  Ulcer right submetatarsal 4 ulceration with fat layer exposed -Debridement as below. -Dressed with Betadine wet-to-dry, DSD. -Continue off-loading with surgical shoe. -If there is no improvement to next clinical visit I will further delve into the peripheral vascular disease in order ABIs PVRs.  Procedure: Excisional Debridement of Wound Tool: Sharp chisel blade/tissue nipper Rationale: Removal of non-viable soft tissue from the wound to promote healing.  Anesthesia: none Pre-Debridement Wound Measurements: 0.7 cm x 0.5 cm x 0.5 cm  Post-Debridement Wound Measurements: 0.9 cm x 0.7 cm x 0.6 cm  Type of Debridement: Sharp Excisional Tissue Removed: Non-viable soft tissue Blood loss: Minimal (<50cc) Depth of Debridement: subcutaneous tissue. Technique: Sharp  excisional debridement to bleeding, viable wound base.  Wound Progress: The initial my initial evaluation I will continue monitor the progression of it. Site healing conversation 7 Dressing: Dry, sterile, compression dressing. Disposition: Patient tolerated procedure well. Patient to return in 1 week for follow-up.  No follow-ups on file.

## 2021-03-07 ENCOUNTER — Other Ambulatory Visit: Payer: Self-pay | Admitting: Internal Medicine

## 2021-03-13 ENCOUNTER — Other Ambulatory Visit: Payer: Self-pay

## 2021-03-13 ENCOUNTER — Ambulatory Visit: Payer: Medicaid Other | Admitting: Podiatry

## 2021-03-13 DIAGNOSIS — I999 Unspecified disorder of circulatory system: Secondary | ICD-10-CM

## 2021-03-13 DIAGNOSIS — L97512 Non-pressure chronic ulcer of other part of right foot with fat layer exposed: Secondary | ICD-10-CM | POA: Diagnosis not present

## 2021-03-13 DIAGNOSIS — E119 Type 2 diabetes mellitus without complications: Secondary | ICD-10-CM | POA: Diagnosis not present

## 2021-03-13 MED ORDER — DOXYCYCLINE HYCLATE 100 MG PO TABS: 100.0000 mg | ORAL_TABLET | Freq: Two times a day (BID) | ORAL | 0 refills | Status: AC

## 2021-03-16 ENCOUNTER — Ambulatory Visit (HOSPITAL_COMMUNITY)
Admission: RE | Admit: 2021-03-16 | Discharge: 2021-03-16 | Disposition: A | Discharge status: Home / Self Care | Payer: Medicaid Other | Source: Ambulatory Visit | Attending: Podiatry | Admitting: Podiatry

## 2021-03-16 ENCOUNTER — Other Ambulatory Visit: Payer: Self-pay

## 2021-03-16 DIAGNOSIS — I999 Unspecified disorder of circulatory system: Secondary | ICD-10-CM | POA: Insufficient documentation

## 2021-03-17 ENCOUNTER — Other Ambulatory Visit: Payer: Self-pay | Admitting: Podiatry

## 2021-03-17 ENCOUNTER — Encounter: Payer: Self-pay | Admitting: Podiatry

## 2021-03-17 DIAGNOSIS — L97512 Non-pressure chronic ulcer of other part of right foot with fat layer exposed: Secondary | ICD-10-CM

## 2021-03-17 NOTE — Progress Notes (Signed)
Subjective:  Patient ID: Perry Thomas, male    DOB: 1961-06-09,  MRN: [id number]  Chief Complaint  Patient presents with  . Wound Check    PT stated that he is doing okay he feels like his foot is healing     60 y.o. male presents for wound care.  Patient presents with a follow-up of right submetatarsal 4 ulceration.  Patient states is doing well.  He has been doing dressing changes with Betadine wet-to-dry dressing changes he would like to discuss the next treatment plan he denies any other acute complaints.   Review of Systems: Negative except as noted in the HPI. Denies N/V/F/Ch.  Past Medical History:  Diagnosis Date  . Atypical chest pain 05/30/2019  . Diabetes mellitus without complication ([place])   . Elevated PSA, less than 10 ng/ml 03/31/2017  . ESRD (end stage renal disease) on dialysis ([place]) 01/30/2016  . ESRD on dialysis ([place])   . Hypertension   . Hypertension associated with diabetes ([place]) 01/30/2016  . Nuclear sclerotic cataract of right eye 08/25/2020  . PAD (peripheral artery disease) ([place])   . Posterior subcapsular age-related cataract, right eye 08/25/2020  . Type 2 diabetes mellitus ([place]) 01/30/2016    Current Outpatient Medications:  .  doxycycline (VIBRA-TABS) 100 MG tablet, Take 1 tablet (100 mg total) by mouth 2 (two) times daily., Disp: 20 tablet, Rfl: 0 .  amLODipine (NORVASC) 10 MG tablet, Take 1 tablet (10 mg total) by mouth daily., Disp: 30 tablet, Rfl: 11 .  aspirin 81 MG EC tablet, Take by mouth., Disp: , Rfl:  .  carvedilol (COREG) 12.5 MG tablet, Take 1 tablet (12.5 mg total) by mouth 2 (two) times daily with a meal., Disp: 60 tablet, Rfl: 11 .  clopidogrel (PLAVIX) 75 MG tablet, Take 1 tablet (75 mg total) by mouth daily., Disp: 90 tablet, Rfl: 3 .  Continuous Blood Gluc Sensor (DEXCOM G6 SENSOR) MISC, Apply 1 sensor to the skin every 10 days for continuous glucose monitoring., Disp: 4 each, Rfl: 3 .  Continuous Blood Gluc Transmit (DEXCOM G6 TRANSMITTER)  MISC, USE AS DIRECTED FOR CONTINUOUS GLUCOSE MONITORING. REUSE TRANSMITTER FOR 90 DAYS THEN DISCARD AND REPLACE., Disp: 1 each, Rfl: 0 .  doxycycline (VIBRA-TABS) 100 MG tablet, Take 1 tablet (100 mg total) by mouth 2 (two) times daily., Disp: 20 tablet, Rfl: 0 .  DUREZOL 0.05 % EMUL, INSTILL 1 DROP INTO LEFT EYE 2 TIMES WEEKLY (Patient not taking: No sig reported), Disp: , Rfl: 1 .  furosemide (LASIX) 80 MG tablet, Take 1/2 tablet (40 mg) daily as needed;, Disp: , Rfl:  .  insulin glargine (LANTUS SOLOSTAR) 100 UNIT/ML Solostar Pen, Inject 12 Units into the skin daily., Disp: 3.6 mL, Rfl: 0 .  Lancets (ONETOUCH ULTRASOFT) lancets, Use to check blood sugar before breakfast and before supper. E11.65, Disp: , Rfl:  .  mycophenolate (MYFORTIC) 180 MG EC tablet, Take by mouth. 180 mg two tablets twice a day, Disp: , Rfl:  .  pantoprazole (PROTONIX) 40 MG tablet, TAKE 1 TABLET BY MOUTH EVERY DAY, Disp: 30 tablet, Rfl: 1 .  prednisoLONE acetate (PRED FORTE) 1 % ophthalmic suspension, Apply 3 drops to each nostril twice daily through 03/13/20 then decrease use to once daily. (Patient not taking: Reported on 12/18/2020), Disp: , Rfl:  .  predniSONE (DELTASONE) 5 MG tablet, Take by mouth. (Patient not taking: Reported on 12/18/2020), Disp: , Rfl:  .  SIMBRINZA 1-0.2 % SUSP, INSTILL 1 DROP INTO LEFT EYE  TWICE A DAY, Disp: , Rfl: 4 .  sulfamethoxazole-trimethoprim (BACTRIM) 200-40 MG/5ML suspension, Take by mouth 2 (two) times daily., Disp: , Rfl:  .  tacrolimus (PROGRAF) 1 MG capsule, Take by mouth., Disp: , Rfl:  .  TRULICITY [id number] SOPN, INJECT 0.'75MG'$  DOSE INTO THE SKIN ONCE WEEKLY, Disp: 0.5 mL, Rfl: 1  Social History   Tobacco Use  Smoking Status Former Smoker  . Quit date: 12/13/2009  . Years since quitting: 11.2  Smokeless Tobacco Never Used    No Known Allergies Objective:  There were no vitals filed for this visit. There is no height or weight on file to calculate BMI. Constitutional Well  developed. Well nourished.  Vascular Dorsalis pedis pulses non palpable bilaterally. Posterior tibial pulses non palpable bilaterally. Capillary refill normal to all digits.  No cyanosis or clubbing noted. Pedal hair growth normal.  Neurologic Normal speech. Oriented to person, place, and time. Protective sensation absent  Dermatologic Wound Location: Right submetatarsal 4 with fat layer exposed.  Some malodor present.  No purulent drainage noted.  Does not probe down to bone.  Probes down to deep tissue.  No cellulitis noted. Wound Base: Mixed Granular/Fibrotic Peri-wound: Calloused Exudate: Scant/small amount Serous exudate Wound Measurements: -None  Orthopedic: No pain to palpation either foot.   Radiographs: 3 views of skeletally mature the right foot: No osteomyelitis noted.  No soft tissue emphysema noted.  No fractures noted. Assessment:   1. Vascular abnormality   2. Foot ulceration, right, with fat layer exposed (Perry Thomas)   3. Diabetes mellitus without complication (Perry Thomas)    Plan:  Patient was evaluated and treated and all questions answered.  Ulcer right submetatarsal 4 ulceration with fat layer exposed -Debridement as below. -Dressed with Betadine wet-to-dry, DSD. -Continue off-loading with surgical shoe. -ABI PVRs were ordered.  If there is no vascular flow to the lower extremity that we referred to a vascular specialist. -Patient is a high risk of amputation given the nature of the vascular compromise as well as underlying diabetes.  Patient states understanding.  Procedure: Excisional Debridement of Wound Tool: Sharp chisel blade/tissue nipper Rationale: Removal of non-viable soft tissue from the wound to promote healing.  Anesthesia: none Pre-Debridement Wound Measurements: 0.5 cm x 0.4 cm x 0.4 cm  Post-Debridement Wound Measurements: 0.7 cm x 0.5 cm x 0.5 cm  Type of Debridement: Sharp Excisional Tissue Removed: Non-viable soft tissue Blood loss: Minimal  (<50cc) Depth of Debridement: subcutaneous tissue. Technique: Sharp excisional debridement to bleeding, viable wound base.  Wound Progress: The wound has slightly decreased from previous measurement.  We will continue monitor the progression of Dressing: Dry, sterile, compression dressing. Disposition: Patient tolerated procedure well. Patient to return in 1 week for follow-up.  No follow-ups on file.

## 2021-03-25 ENCOUNTER — Ambulatory Visit (INDEPENDENT_AMBULATORY_CARE_PROVIDER_SITE_OTHER): Payer: Medicaid Other | Admitting: Podiatry

## 2021-03-25 ENCOUNTER — Encounter: Payer: Self-pay | Admitting: Podiatry

## 2021-03-25 ENCOUNTER — Other Ambulatory Visit: Payer: Self-pay

## 2021-03-25 DIAGNOSIS — I999 Unspecified disorder of circulatory system: Secondary | ICD-10-CM

## 2021-03-25 DIAGNOSIS — E119 Type 2 diabetes mellitus without complications: Secondary | ICD-10-CM

## 2021-03-25 DIAGNOSIS — L97512 Non-pressure chronic ulcer of other part of right foot with fat layer exposed: Secondary | ICD-10-CM | POA: Diagnosis not present

## 2021-03-25 NOTE — Progress Notes (Signed)
Subjective:  Patient ID: Perry Thomas, male    DOB: 1961-12-06,  MRN: [id number]  Chief Complaint  Patient presents with  . Wound Check    PT stated that he is doing okay he feels like he is healing     60 y.o. male presents for wound care.  Patient presents with a follow-up of right submetatarsal 4 ulceration.  Patient states is doing well.  He has been doing dressing changes with Betadine wet-to-dry dressing changes he would like to discuss the next treatment plan he denies any other acute complaints.   Review of Systems: Negative except as noted in the HPI. Denies N/V/F/Ch.  Past Medical History:  Diagnosis Date  . Atypical chest pain 05/30/2019  . Diabetes mellitus without complication ([place])   . Elevated PSA, less than 10 ng/ml 03/31/2017  . ESRD (end stage renal disease) on dialysis ([place]) 01/30/2016  . ESRD on dialysis ([place])   . Hypertension   . Hypertension associated with diabetes ([place]) 01/30/2016  . Nuclear sclerotic cataract of right eye 08/25/2020  . PAD (peripheral artery disease) ([place])   . Posterior subcapsular age-related cataract, right eye 08/25/2020  . Type 2 diabetes mellitus ([place]) 01/30/2016    Current Outpatient Medications:  .  amLODipine (NORVASC) 10 MG tablet, Take 1 tablet (10 mg total) by mouth daily., Disp: 30 tablet, Rfl: 11 .  aspirin 81 MG EC tablet, Take by mouth., Disp: , Rfl:  .  carvedilol (COREG) 12.5 MG tablet, Take 1 tablet (12.5 mg total) by mouth 2 (two) times daily with a meal., Disp: 60 tablet, Rfl: 11 .  clopidogrel (PLAVIX) 75 MG tablet, Take 1 tablet (75 mg total) by mouth daily., Disp: 90 tablet, Rfl: 3 .  Continuous Blood Gluc Sensor (DEXCOM G6 SENSOR) MISC, Apply 1 sensor to the skin every 10 days for continuous glucose monitoring., Disp: 4 each, Rfl: 3 .  Continuous Blood Gluc Transmit (DEXCOM G6 TRANSMITTER) MISC, USE AS DIRECTED FOR CONTINUOUS GLUCOSE MONITORING. REUSE TRANSMITTER FOR 90 DAYS THEN DISCARD AND REPLACE., Disp: 1 each, Rfl:  0 .  doxycycline (VIBRA-TABS) 100 MG tablet, Take 1 tablet (100 mg total) by mouth 2 (two) times daily., Disp: 20 tablet, Rfl: 0 .  doxycycline (VIBRA-TABS) 100 MG tablet, Take 1 tablet (100 mg total) by mouth 2 (two) times daily., Disp: 20 tablet, Rfl: 0 .  DUREZOL 0.05 % EMUL, INSTILL 1 DROP INTO LEFT EYE 2 TIMES WEEKLY (Patient not taking: No sig reported), Disp: , Rfl: 1 .  furosemide (LASIX) 80 MG tablet, Take 1/2 tablet (40 mg) daily as needed;, Disp: , Rfl:  .  insulin glargine (LANTUS SOLOSTAR) 100 UNIT/ML Solostar Pen, Inject 12 Units into the skin daily., Disp: 3.6 mL, Rfl: 0 .  Lancets (ONETOUCH ULTRASOFT) lancets, Use to check blood sugar before breakfast and before supper. E11.65, Disp: , Rfl:  .  mycophenolate (MYFORTIC) 180 MG EC tablet, Take by mouth. 180 mg two tablets twice a day, Disp: , Rfl:  .  pantoprazole (PROTONIX) 40 MG tablet, TAKE 1 TABLET BY MOUTH EVERY DAY, Disp: 30 tablet, Rfl: 1 .  prednisoLONE acetate (PRED FORTE) 1 % ophthalmic suspension, Apply 3 drops to each nostril twice daily through 03/13/20 then decrease use to once daily. (Patient not taking: Reported on 12/18/2020), Disp: , Rfl:  .  predniSONE (DELTASONE) 5 MG tablet, Take by mouth. (Patient not taking: Reported on 12/18/2020), Disp: , Rfl:  .  SIMBRINZA 1-0.2 % SUSP, INSTILL 1 DROP INTO LEFT EYE TWICE  A DAY, Disp: , Rfl: 4 .  sulfamethoxazole-trimethoprim (BACTRIM) 200-40 MG/5ML suspension, Take by mouth 2 (two) times daily., Disp: , Rfl:  .  tacrolimus (PROGRAF) 1 MG capsule, Take by mouth., Disp: , Rfl:  .  TRULICITY [id number] 0000000 SOPN, INJECT 0.'75MG'$  DOSE INTO THE SKIN ONCE WEEKLY, Disp: 0.5 mL, Rfl: 1  Social History   Tobacco Use  Smoking Status Former Smoker  . Quit date: 12/13/2009  . Years since quitting: 11.2  Smokeless Tobacco Never Used    No Known Allergies Objective:  There were no vitals filed for this visit. There is no height or weight on file to calculate BMI. Constitutional Well  developed. Well nourished.  Vascular Dorsalis pedis pulses non palpable bilaterally. Posterior tibial pulses non palpable bilaterally. Capillary refill normal to all digits.  No cyanosis or clubbing noted. Pedal hair growth normal.  Neurologic Normal speech. Oriented to person, place, and time. Protective sensation absent  Dermatologic Wound Location: Right submetatarsal 4 with fat layer exposed.  Some malodor present.  No purulent drainage noted.  Does not probe down to bone.  Probes down to deep tissue.  No cellulitis noted. Wound Base: Mixed Granular/Fibrotic Peri-wound: Calloused Exudate: Scant/small amount Serous exudate Wound Measurements: -None  Orthopedic: No pain to palpation either foot.   Radiographs: 3 views of skeletally mature the right foot: No osteomyelitis noted.  No soft tissue emphysema noted.  No fractures noted. Assessment:   1. Vascular abnormality   2. Foot ulceration, right, with fat layer exposed ([place])   3. Diabetes mellitus without complication ([place])    Plan:  Patient was evaluated and treated and all questions answered.  Ulcer right submetatarsal 4 ulceration with fat layer exposed -Debridement as below. -Dressed with Betadine wet-to-dry, DSD. -Continue off-loading with surgical shoe. -ABIs PVRs were reviewed with the patient which showed significant vascular disease to both lower extremity.  I believe patient will benefit from following up with a Dr. Corrie Mckusick for further vascular management.  His wound seems to be stable for now.  However he will need improvement of flow to heal the ulceration. -Patient is a high risk of amputation given the nature of the vascular compromise as well as underlying diabetes.  Patient states understanding.  Procedure: Excisional Debridement of Wound Tool: Sharp chisel blade/tissue nipper Rationale: Removal of non-viable soft tissue from the wound to promote healing.  Anesthesia: none Pre-Debridement Wound  Measurements: 0.4 cm x 0.4 cm x 0.4 cm  Post-Debridement Wound Measurements: 0.6 cm x 0.5 cm x 0.5 cm  Type of Debridement: Sharp Excisional Tissue Removed: Non-viable soft tissue Blood loss: Minimal (<50cc) Depth of Debridement: subcutaneous tissue. Technique: Sharp excisional debridement to bleeding, viable wound base.  Wound Progress: The wound has slightly decreased from previous measurement.  We will continue monitor the progression of Dressing: Dry, sterile, compression dressing. Disposition: Patient tolerated procedure well. Patient to return in 1 week for follow-up.  No follow-ups on file.

## 2021-03-26 ENCOUNTER — Encounter (INDEPENDENT_AMBULATORY_CARE_PROVIDER_SITE_OTHER): Payer: Medicaid Other | Admitting: Ophthalmology

## 2021-03-26 ENCOUNTER — Ambulatory Visit
Admission: RE | Admit: 2021-03-26 | Discharge: 2021-03-26 | Disposition: A | Discharge status: Home / Self Care | Payer: Medicaid Other | Source: Ambulatory Visit | Attending: Podiatry | Admitting: Podiatry

## 2021-03-26 ENCOUNTER — Encounter: Payer: Self-pay | Admitting: *Deleted

## 2021-03-26 DIAGNOSIS — L97512 Non-pressure chronic ulcer of other part of right foot with fat layer exposed: Secondary | ICD-10-CM

## 2021-03-26 HISTORY — PX: IR RADIOLOGIST EVAL & MGMT: IMG5224

## 2021-03-26 NOTE — Consult Note (Addendum)
Chief Complaint: Right foot wound  Referring Physician(s): Patel,Kevin P  History of Present Illness: Perry Thomas is a 60 y.o. male presenting today to [facility] as a scheduled consult, kindly referred by Dr. Posey Pronto of [facility], for evaluation of right lower extremity diabetic foot wound and CLI/PAD.   Perry Thomas joins [place] today by himself in the clinic.   He tells me that over a month ago he stepped on something sharp at his home, like a screw, and that it caused a wound.  This worsened and he pursued wound care.  He has been receiving would care with [facility], and it seems he has been healing but not completely.  He has been taking ABX.  His next appointment is in 3 weeks.    This is the first time he has had a wound on the lower extremity.  I cannot elicit a history of claudication.  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  Currently he is on disability.    He does have a history of dialysis, and had successful renal transplant performed at [facility] 2 years ago.  He has a functional LUE fistula, with excellent thrill.    He denies prior MI or stroke.  He denies any resting chest pain or SOB.   CV risk factors:  DM, HTN, Renal disease, Smoking history (quit years ago)  Non-invasive exam was performed 03/16/21 Right ABI: likely non-diagnostic given the non-compressible vessels.  The reported waveforms are abnormal Left ABI: likely non-diagnostic given the non-compressible vessels.  The reported waveforms are abnormal   Past Medical History:  Diagnosis Date  . Atypical chest pain 05/30/2019  . Diabetes mellitus without complication ([place])   . Elevated PSA, less than 10 ng/ml 03/31/2017  . ESRD (end stage renal disease) on dialysis ([place]) 01/30/2016  . ESRD on dialysis ([place])   . Hypertension   . Hypertension associated with diabetes ([place]) 01/30/2016  . Nuclear sclerotic cataract of right eye  08/25/2020  . PAD (peripheral artery disease) ([place])   . Posterior subcapsular age-related cataract, right eye 08/25/2020  . Type 2 diabetes mellitus ([place]) 01/30/2016    Past Surgical History:  Procedure Laterality Date  . IR RADIOLOGIST EVAL & MGMT  03/26/2021  . PTCA     peripheral artery disease    Allergies: Patient has no known allergies.  Medications: Prior to Admission medications   Medication Sig Start Date End Date Taking? Authorizing Provider  amLODipine (NORVASC) 10 MG tablet Take 1 tablet (10 mg total) by mouth daily. 10/23/20   Jose Persia, MD  aspirin 81 MG EC tablet Take by mouth. 09/05/19   [provider]  carvedilol (COREG) 12.5 MG tablet Take 1 tablet (12.5 mg total) by mouth 2 (two) times daily with a meal. 10/23/20   Jose Persia, MD  clopidogrel (PLAVIX) 75 MG tablet Take 1 tablet (75 mg total) by mouth daily. 10/23/20   Jose Persia, MD  Continuous Blood Gluc Sensor (DEXCOM G6 SENSOR) MISC Apply 1 sensor to the skin every 10 days for continuous glucose monitoring. 12/10/20   Mosetta Anis, MD  Continuous Blood Gluc Transmit (DEXCOM G6 TRANSMITTER) MISC USE AS DIRECTED FOR CONTINUOUS GLUCOSE MONITORING. REUSE TRANSMITTER FOR 90 DAYS THEN DISCARD AND REPLACE. 01/29/21   Mosetta Anis, MD  doxycycline (VIBRA-TABS) 100 MG tablet Take 1 tablet (100 mg total) by mouth 2 (two)  times daily. 03/13/21   Felipa Furnace, DPM  doxycycline (VIBRA-TABS) 100 MG tablet Take 1 tablet (100 mg total) by mouth 2 (two) times daily. 03/13/21   Felipa Furnace, DPM  DUREZOL 0.05 % EMUL INSTILL 1 DROP INTO LEFT EYE 2 TIMES WEEKLY Patient not taking: No sig reported 09/26/18   [provider]  furosemide (LASIX) 80 MG tablet Take 1/2 tablet (40 mg) daily as needed; 03/17/20   [provider]  insulin glargine (LANTUS SOLOSTAR) 100 UNIT/ML Solostar Pen Inject 12 Units into the skin daily. 01/06/21 02/05/21  Harvie Heck, MD  Lancets (ONETOUCH ULTRASOFT) lancets Use to  check blood sugar before breakfast and before supper. E11.65 05/15/20   [provider]  mycophenolate (MYFORTIC) 180 MG EC tablet Take by mouth. 180 mg two tablets twice a day 12/04/19   [provider]  pantoprazole (PROTONIX) 40 MG tablet TAKE 1 TABLET BY MOUTH EVERY DAY 03/04/21   Gaylan Gerold, DO  prednisoLONE acetate (PRED FORTE) 1 % ophthalmic suspension Apply 3 drops to each nostril twice daily through 03/13/20 then decrease use to once daily. Patient not taking: Reported on 12/18/2020 03/14/20   [provider]  predniSONE (DELTASONE) 5 MG tablet Take by mouth. Patient not taking: Reported on 12/18/2020 04/03/20   [provider]  [place] 1-0.2 % SUSP INSTILL 1 DROP INTO LEFT EYE TWICE A DAY 09/06/17   [provider]  sulfamethoxazole-trimethoprim (BACTRIM) 200-40 MG/5ML suspension Take by mouth 2 (two) times daily.    [provider]  tacrolimus (PROGRAF) 1 MG capsule Take by mouth. 05/01/20   [provider]  TRULICITY A999333 0000000 SOPN INJECT 0.'75MG'$  DOSE INTO THE SKIN ONCE WEEKLY 03/09/21   Sanjuan Dame, MD     Family History  Problem Relation Age of Onset  . Healthy Mother   . Liver cancer Father   . Kidney disease Sister     Social History   Socioeconomic History  . Marital status: Single    Spouse name: Not on file  . Number of children: 1  . Years of education: 32  . Highest education level: Associate degree: occupational, Hotel manager, or vocational program  Occupational History  . Occupation: disability    Comment: retired Clinical biochemist  Tobacco Use  . Smoking status: Former Smoker    Quit date: 12/13/2009    Years since quitting: 11.2  . Smokeless tobacco: Never Used  Vaping Use  . Vaping Use: Never used  Substance and Sexual Activity  . Alcohol use: No    Alcohol/week: 0.0 standard drinks  . Drug use: No  . Sexual activity: Not on file  Other Topics Concern  . Not on file  Social History Narrative    Lives with niece in an apartment on the first floor.  On disability for dialysis.     Social Determinants of Health   Financial Resource Strain: Not on file  Food Insecurity: Not on file  Transportation Needs: Not on file  Physical Activity: Not on file  Stress: Not on file  Social Connections: Not on file       Review of Systems: A 12 point ROS discussed and pertinent positives are indicated in the HPI above.  All other systems are negative.  Review of Systems  Vital Signs: BP (!) 149/79 (BP Location: Right Arm)   Pulse 78   SpO2 97%   Physical Exam  General: 60 yo male appearing stated age.  Well-developed, well-nourished.  No distress. HEENT: Atraumatic, normocephalic.  Conjugate gaze, extra-ocular motor intact. No scleral icterus or scleral injection. No lesions on external ears, nose, lips, or gums.  Oral mucosa moist, pink.  Neck: Symmetric with no goiter enlargement.  Chest/Lungs:  Symmetric chest with inspiration/expiration.  No labored breathing.  Clear to auscultation with no wheezes, rhonchi, or rales.  Heart:  RRR, with no third heart sounds appreciated. No JVD appreciated.  Abdomen:  Obese, Soft, NT/ND, with + bowel sounds.   Genito-urinary: Deferred Neurologic: Alert & Oriented to person, place, and time.   Normal affect and insight.  Appropriate questions.  Moving all 4 extremities with gross sensory intact.  Pulse Exam:  Positive Doppler right AT and PT.  Extremities: NIckel-sized wound of the plantar right foot, with no drainage or local erythema.  Full thickness with cratered margin. .      Mallampati Score:     Imaging: DG Foot Complete Right  Result Date: 02/25/2021 Please see detailed radiograph report in office note.  VAS [place] ABI WITH/WO TBI  Result Date: 03/16/2021 LOWER EXTREMITY DOPPLER STUDY Indications: Peripheral artery disease, and Right forefoot wound after stepping              on screw. High Risk Factors: Hypertension, past history of  smoking.  Comparison Study: 07/04/18 Performing Technologist: June Leap RDMS, RVT  Examination Guidelines: A complete evaluation includes at minimum, Doppler waveform signals and systolic blood pressure reading at the level of bilateral brachial, anterior tibial, and posterior tibial arteries, when vessel segments are accessible. Bilateral testing is considered an integral part of a complete examination. Photoelectric Plethysmograph (PPG) waveforms and toe systolic pressure readings are included as required and additional duplex testing as needed. Limited examinations for reoccurring indications may be performed as noted.  ABI Findings: +---------+------------------+-----+----------+--------+ Right    Rt Pressure (mmHg)IndexWaveform  Comment  +---------+------------------+-----+----------+--------+ Brachial 142                                       +---------+------------------+-----+----------+--------+ ATA      129               0.91 monophasic         +---------+------------------+-----+----------+--------+ PTA      255               1.80 triphasic          +---------+------------------+-----+----------+--------+ Great Toe95                0.67 Abnormal           +---------+------------------+-----+----------+--------+ +---------+------------------+-----+--------+---------+ Left     Lt Pressure (mmHg)IndexWaveformComment   +---------+------------------+-----+--------+---------+ Brachial                                HD access +---------+------------------+-----+--------+---------+ ATA      149               1.05 biphasic          +---------+------------------+-----+--------+---------+ PTA      255               1.80 biphasic          +---------+------------------+-----+--------+---------+ Great Toe108               0.76 Normal            +---------+------------------+-----+--------+---------+ +-------+-----------+-----------+------------+------------+  ABI/TBIToday's ABIToday's TBIPrevious ABIPrevious TBI +-------+-----------+-----------+------------+------------+  Right  [place]         0.67       [place]          0.57         +-------+-----------+-----------+------------+------------+ Left   [place]         0.76                 0.63         +-------+-----------+-----------+------------+------------+  Summary: Right: Resting right ankle-brachial index indicates noncompressible right lower extremity arteries. The right toe-brachial index is abnormal. Left: Resting left ankle-brachial index indicates noncompressible left lower extremity arteries. The left toe-brachial index is normal.  *See table(s) above for measurements and observations.  Electronically signed by Harold Barban MD on 03/16/2021 at 4:20:41 PM.    Final    IR Radiologist Eval & Mgmt  Result Date: 03/26/2021 Please refer to notes tab for details about interventional procedure. (Op Note)   Labs:  CBC: No results for input(s): WBC, HGB, HCT, PLT in the last 8760 hours.  COAGS: No results for input(s): INR, APTT in the last 8760 hours.  BMP: No results for input(s): NA, K, CL, CO2, GLUCOSE, BUN, CALCIUM, CREATININE, GFRNONAA, GFRAA in the last 8760 hours.  Invalid input(s): CMP  LIVER FUNCTION TESTS: No results for input(s): BILITOT, AST, ALT, ALKPHOS, PROT, ALBUMIN in the last 8760 hours.  TUMOR MARKERS: No results for input(s): AFPTM, CEA, CA199, CHROMGRNA in the last 8760 hours.  Assessment and Plan:  Assessment:  21 is a male presenting with right lower extremity diabetic foot wound after trauma, compatible with [place] 5 class symptoms of CLI/CLTI.       Non-invasive lower extremity exam and imaging work-up shows evidence of at least right sided tibial arterial disease.  .   We had a discussion with Perry Thomas regarding anatomy, pathology/pathophysiology, natural history, and prognosis of PAD/CLI.  Informed consent regarding treatment strategies was performed  which would possibly include medical management,   surgical strategy, and/or endovascular options, with risk/benefit discussion.  The indications for treatment supported by updated guidelines1, 2 were discussed.  I think there is good utility in considering endovascular approach to improve flow to the RLE, given his history and apparent disease pattern.      Patient has elected to proceed with endovascular options.   Regarding endovascular options, specific risks discussed include: bleeding, infection, contrast reaction, renal injury/nephropathy, arterial injury/dissection, need for additional procedure/surgery, worsening symptoms/tissue including limb loss, cardiopulmonary collapse, death.    Regarding medical management, maximal medical therapy for reduction of risk factors is indicated as recommended by updated AHA guidelines1.  This includes anti-platelet medication, tight blood glucose control to a HbA1c < 7, tight blood pressure control, maximum-dose HMG-CoA reductase inhibitor.  He has already quit smoking.   Annual flu vaccination is also recommended, with Class 1 recommendation1.   Plan: -Plan is to proceed with aorto-peripheral angiogram and possible intervention with Dr. Earleen Newport, targeting the right lower extremity. -Recommend maximal medical therapy for cardiovascular risk reduction, including anti-platelet therapy and consider adding HMG-CoA reductase inhibitor. . -Annual flu vaccination is recommended in the setting of known PAD, in the absence of contra-indications.    ___________________________________________________________________   1Morley Kos MD, et al. 2016 AHA/ACC Guideline on the Management of Patients With Lower Extremity Peripheral Artery Disease: Executive Summary: A Report of the American College of Cardiology/American Heart Association Task Force on Clinical Practice Guidelines. J Am Coll Cardiol. 2017 Mar 21;69(11):1465-1508. doi: 10.1016/j.jacc.2016.11.008.    2 -  Norgren L, et al. TASC II Working Group. Inter-society consensus for the management of peripheral arterial disease. Int Tressia Miners. 2007 Jun;26(2):81-157. Review. PubMed PMID: [id number]  3 - Hingorani A, et al. The management of diabetic foot: A clinical practice guideline by the Society for Vascular Surgery in collaboration with the [place] and the Society  for Vascular Medicine. J Vasc Surg. 2016 Feb;63(2 Suppl):3S-21S. doi: 10.1016/j.jvs.2015.10.003. PubMed PMID: [id number].  4 - Corinna Gab, Saab FA, Luberta Mutter, Grant Ruts, Ewell Poe, Driver VR, [place], Lookstein R, van den Baldemar Lenis, Jaff Perry, Guadalupe Dawn, Henao S, AlMahameed A, Katzen B. Digital Subtraction Angiography Prior to an Amputation for Critical Limb Ischemia (CLI): An Expert Recommendation Statement From the CLI Global Society to Optimize Limb Salvage. J Endovasc Ther. 2020 Aug;27(4):540-546. doi: 10.1177/1526602820928590. Epub 2020 May 29. PMID: [id number].    Thank you for this interesting consult.  I greatly enjoyed meeting Perry Thomas and look forward to participating in their care.  A copy of this report was sent to the requesting provider on this date.  Electronically Signed: Corrie Mckusick 03/26/2021, 1:09 PM   I spent a total of  60 Minutes   in face to face in clinical consultation, greater than 50% of which was counseling/coordinating care for right foot diabetic foot wound, Rutherford 5 class symptoms of CLI/PAD, possible angiogram and intervention

## 2021-03-30 ENCOUNTER — Encounter (INDEPENDENT_AMBULATORY_CARE_PROVIDER_SITE_OTHER): Payer: Self-pay | Admitting: Ophthalmology

## 2021-03-30 ENCOUNTER — Ambulatory Visit (INDEPENDENT_AMBULATORY_CARE_PROVIDER_SITE_OTHER): Payer: Medicaid Other | Admitting: Ophthalmology

## 2021-03-30 ENCOUNTER — Other Ambulatory Visit: Payer: Self-pay

## 2021-03-30 DIAGNOSIS — E113512 Type 2 diabetes mellitus with proliferative diabetic retinopathy with macular edema, left eye: Secondary | ICD-10-CM

## 2021-03-30 DIAGNOSIS — E113511 Type 2 diabetes mellitus with proliferative diabetic retinopathy with macular edema, right eye: Secondary | ICD-10-CM

## 2021-03-30 NOTE — Assessment & Plan Note (Signed)
This condition OS has resolved as of today.  We will continue to observe.  Will avoid use of antivegF as patient has a healing titrating injury to the foot in a situation with poor perfusion regionally due to vascular disease

## 2021-03-30 NOTE — Progress Notes (Signed)
03/30/2021     CHIEF COMPLAINT Patient presents for Retina Follow Up (8 wk fu OU/ Possible Avastin OS//Pt states VA OU stable since last visit. Pt denies FOL, floaters, or ocular pain OU. / /A1C:7.0/LBS: 180)   HISTORY OF PRESENT ILLNESS: Perry Thomas is a 60 y.o. male who presents to the clinic today for:   HPI    Retina Follow Up    Patient presents with  Diabetic Retinopathy.  In left eye.  This started 8 weeks ago.  Severity is mild.  Duration of 8 weeks.  Since onset it is stable. Additional comments: 8 wk fu OU/ Possible Avastin OS  Pt states VA OU stable since last visit. Pt denies FOL, floaters, or ocular pain OU.    A1C:7.0 LBS: 180       Last edited by Kendra Opitz, COA on 03/30/2021  8:57 AM. (History)      Referring physician: Mosetta Anis, MD [address],  [place] [address]  HISTORICAL INFORMATION:   Selected notes from the MEDICAL RECORD NUMBER    Lab Results  Component Value Date   HGBA1C 6.2 (A) 10/23/2020     CURRENT MEDICATIONS: Current Outpatient Medications (Ophthalmic Drugs)  Medication Sig  . DUREZOL 0.05 % EMUL INSTILL 1 DROP INTO LEFT EYE 2 TIMES WEEKLY (Patient not taking: No sig reported)  . prednisoLONE acetate (PRED FORTE) 1 % ophthalmic suspension Apply 3 drops to each nostril twice daily through 03/13/20 then decrease use to once daily. (Patient not taking: Reported on 12/18/2020)  . SIMBRINZA 1-0.2 % SUSP INSTILL 1 DROP INTO LEFT EYE TWICE A DAY   No current facility-administered medications for this visit. (Ophthalmic Drugs)   Current Outpatient Medications (Other)  Medication Sig  . amLODipine (NORVASC) 10 MG tablet Take 1 tablet (10 mg total) by mouth daily.  Marland Kitchen aspirin 81 MG EC tablet Take by mouth.  . carvedilol (COREG) 12.5 MG tablet Take 1 tablet (12.5 mg total) by mouth 2 (two) times daily with a meal.  . clopidogrel (PLAVIX) 75 MG tablet Take 1 tablet (75 mg total) by mouth daily.  . Continuous Blood Gluc Sensor  (DEXCOM G6 SENSOR) MISC Apply 1 sensor to the skin every 10 days for continuous glucose monitoring.  . Continuous Blood Gluc Transmit (DEXCOM G6 TRANSMITTER) MISC USE AS DIRECTED FOR CONTINUOUS GLUCOSE MONITORING. REUSE TRANSMITTER FOR 90 DAYS THEN DISCARD AND REPLACE.  Marland Kitchen doxycycline (VIBRA-TABS) 100 MG tablet Take 1 tablet (100 mg total) by mouth 2 (two) times daily.  Marland Kitchen doxycycline (VIBRA-TABS) 100 MG tablet Take 1 tablet (100 mg total) by mouth 2 (two) times daily.  . furosemide (LASIX) 80 MG tablet Take 1/2 tablet (40 mg) daily as needed;  . insulin glargine (LANTUS SOLOSTAR) 100 UNIT/ML Solostar Pen Inject 12 Units into the skin daily.  . Lancets (ONETOUCH ULTRASOFT) lancets Use to check blood sugar before breakfast and before supper. E11.65  . mycophenolate (MYFORTIC) 180 MG EC tablet Take by mouth. 180 mg two tablets twice a day  . pantoprazole (PROTONIX) 40 MG tablet TAKE 1 TABLET BY MOUTH EVERY DAY  . predniSONE (DELTASONE) 5 MG tablet Take by mouth. (Patient not taking: Reported on 12/18/2020)  . sulfamethoxazole-trimethoprim (BACTRIM) 200-40 MG/5ML suspension Take by mouth 2 (two) times daily.  . tacrolimus (PROGRAF) 1 MG capsule Take by mouth.  . TRULICITY A999333 0000000 SOPN INJECT 0.'75MG'$  DOSE INTO THE SKIN ONCE WEEKLY   No current facility-administered medications for this visit. (Other)  REVIEW OF SYSTEMS:    ALLERGIES No Known Allergies  PAST MEDICAL HISTORY Past Medical History:  Diagnosis Date  . Atypical chest pain 05/30/2019  . Diabetes mellitus without complication ([place])   . Elevated PSA, less than 10 ng/ml 03/31/2017  . ESRD (end stage renal disease) on dialysis ([place]) 01/30/2016  . ESRD on dialysis ([place])   . Hypertension   . Hypertension associated with diabetes ([place]) 01/30/2016  . Nuclear sclerotic cataract of right eye 08/25/2020  . PAD (peripheral artery disease) ([place])   . Posterior subcapsular age-related cataract, right eye 08/25/2020  . Type 2 diabetes  mellitus ([place]) 01/30/2016   Past Surgical History:  Procedure Laterality Date  . IR RADIOLOGIST EVAL & MGMT  03/26/2021  . PTCA     peripheral artery disease    FAMILY HISTORY Family History  Problem Relation Age of Onset  . Healthy Mother   . Liver cancer Father   . Kidney disease Sister     SOCIAL HISTORY Social History   Tobacco Use  . Smoking status: Former Smoker    Quit date: 12/13/2009    Years since quitting: 11.3  . Smokeless tobacco: Never Used  Vaping Use  . Vaping Use: Never used  Substance Use Topics  . Alcohol use: No    Alcohol/week: 0.0 standard drinks  . Drug use: No         OPHTHALMIC EXAM: Base Eye Exam    Visual Acuity (ETDRS)      Right Left   Dist [place] 20/25 20/40 -1   Dist ph [place]  20/30 -2       Tonometry (Tonopen, 9:02 AM)      Right Left   Pressure 13 16       Pupils      Pupils Dark Light Shape React APD   Right PERRL 5 4 Round Sluggish None   Left PERRL 5 4 Round Sluggish None       Visual Fields (Counting fingers)      Left Right    Full Full       Neuro/Psych    Oriented x3: Yes   Mood/Affect: Normal       Dilation    Both eyes: 1.0% Mydriacyl, 2.5% Phenylephrine @ 9:02 AM        Slit Lamp and Fundus Exam    External Exam      Right Left   External Normal Normal       Slit Lamp Exam      Right Left   Lids/Lashes Normal Normal   Conjunctiva/Sclera White and quiet White and quiet   Cornea Clear Clear   Anterior Chamber Deep and quiet Deep and quiet   Iris Round and reactive Round and reactive   Lens Well centered multifocal IOL Posterior chamber intraocular lens, Centered posterior chamber intraocular lens   Anterior Vitreous Normal Small remnant of oil in the anterior hyaloid       Fundus Exam      Right Left   Posterior Vitreous Clear posteriorly Clear posteriorly   Disc 1+ Optic disc atrophy, 1+ Pallor 1+ Optic disc atrophy, 1+ Pallor   C/D Ratio 0.85 0.85   Macula Microaneurysms, no macular thickening  Microaneurysms, no macular thickening   Vessels PDR-quiet PDR-quiet   Periphery Good PRP peripherally Good PRP peripherally          IMAGING AND PROCEDURES  Imaging and Procedures for 03/30/21  OCT, Retina - OU - Both Eyes  Right Eye Quality was good. Scan locations included subfoveal. Central Foveal Thickness: 263. Progression has improved. Findings include abnormal foveal contour.   Left Eye Quality was good. Scan locations included subfoveal. Central Foveal Thickness: 261. Progression has improved. Findings include abnormal foveal contour.   Notes Much improved CSME OD, will observe   CSME OS nearly resolved post focal and previous laser treatment.  We will continue to observe OS today                ASSESSMENT/PLAN:  Diabetic macular edema of left eye with proliferative retinopathy associated with type 2 diabetes mellitus ([place]) This condition OS has resolved as of today.  We will continue to observe.  Will avoid use of antivegF as patient has a healing titrating injury to the foot in a situation with poor perfusion regionally due to vascular disease  Diabetic macular edema of right eye with proliferative retinopathy associated with type 2 diabetes mellitus ([place]) Stable PDR, no active CME, CSME will observe      ICD-10-CM   1. Diabetic macular edema of left eye with proliferative retinopathy associated with type 2 diabetes mellitus (H[facility])  [id number] OCT, Retina - OU - Both Eyes  2. Diabetic macular edema of right eye with proliferative retinopathy associated with type 2 diabetes mellitus ([place])  E11.3511     1.  OU CSME vastly improved, will continue to observe.  2.  We will avoid the use of antivegF intravitreal OS today so as to prevent distant poor healing, right foot, status post recent penetrating injury in the setting of poor regional perfusion due to proximal vascular disease  3.  Ophthalmic Meds Ordered this visit:  No orders of the defined types  were placed in this encounter.      Return in about 4 months (around 07/30/2021) for DILATE OU, COLOR FP, OCT.  There are no Patient Instructions on file for this visit.   Explained the diagnoses, plan, and follow up with the patient and they expressed understanding.  Patient expressed understanding of the importance of proper follow up care.   Clent Demark Taralee Marcus M.D. Diseases & Surgery of the Retina and Vitreous [facility] [place] 03/30/21     Abbreviations: M myopia (nearsighted); A astigmatism; H hyperopia (farsighted); P presbyopia; Mrx spectacle prescription;  CTL contact lenses; OD right eye; OS left eye; OU both eyes  XT exotropia; ET esotropia; PEK punctate epithelial keratitis; PEE punctate epithelial erosions; DES dry eye syndrome; MGD meibomian gland dysfunction; ATs artificial tears; PFAT's preservative free artificial tears; [place] nuclear sclerotic cataract; PSC posterior subcapsular cataract; ERM epi-retinal membrane; PVD posterior vitreous detachment; RD retinal detachment; DM diabetes mellitus; DR diabetic retinopathy; NPDR non-proliferative diabetic retinopathy; PDR proliferative diabetic retinopathy; CSME clinically significant macular edema; DME diabetic macular edema; dbh dot blot hemorrhages; CWS cotton wool spot; POAG primary open angle glaucoma; C/D cup-to-disc ratio; HVF humphrey visual field; GVF goldmann visual field; OCT optical coherence tomography; IOP intraocular pressure; BRVO Branch retinal vein occlusion; CRVO central retinal vein occlusion; CRAO central retinal artery occlusion; BRAO branch retinal artery occlusion; RT retinal tear; SB scleral buckle; PPV pars plana vitrectomy; VH Vitreous hemorrhage; PRP panretinal laser photocoagulation; IVK intravitreal kenalog; VMT vitreomacular traction; MH Macular hole;  NVD neovascularization of the disc; NVE neovascularization elsewhere; AREDS age related eye disease study; ARMD age related macular degeneration; POAG  primary open angle glaucoma; EBMD epithelial/anterior basement membrane dystrophy; ACIOL anterior chamber intraocular lens; IOL intraocular lens; PCIOL posterior chamber intraocular lens; Phaco/IOL  phacoemulsification with intraocular lens placement; [place] photorefractive keratectomy; LASIK laser assisted in situ keratomileusis; HTN hypertension; DM diabetes mellitus; COPD chronic obstructive pulmonary disease

## 2021-03-30 NOTE — Assessment & Plan Note (Signed)
Stable PDR, no active CME, CSME will observe

## 2021-04-07 ENCOUNTER — Telehealth: Payer: Self-pay | Admitting: *Deleted

## 2021-04-07 NOTE — Telephone Encounter (Signed)
Information was called to [facility] Medicaid at [id number] for PA for Trulicity [id number] ml.   Approved 04/07/2021 thru 04/07/2022.  [place] [id number].  Call to [facility] at [phone] to notify of approval.  Sander Nephew, RN 04/07/2021 11:59 AM.

## 2021-04-10 ENCOUNTER — Other Ambulatory Visit (HOSPITAL_COMMUNITY): Payer: Self-pay | Admitting: Interventional Radiology

## 2021-04-10 DIAGNOSIS — L97519 Non-pressure chronic ulcer of other part of right foot with unspecified severity: Secondary | ICD-10-CM

## 2021-04-15 ENCOUNTER — Other Ambulatory Visit: Payer: Self-pay

## 2021-04-15 ENCOUNTER — Ambulatory Visit (INDEPENDENT_AMBULATORY_CARE_PROVIDER_SITE_OTHER): Payer: Medicaid Other | Admitting: Podiatry

## 2021-04-15 DIAGNOSIS — E119 Type 2 diabetes mellitus without complications: Secondary | ICD-10-CM

## 2021-04-15 DIAGNOSIS — L97512 Non-pressure chronic ulcer of other part of right foot with fat layer exposed: Secondary | ICD-10-CM | POA: Diagnosis not present

## 2021-04-17 ENCOUNTER — Encounter: Payer: Self-pay | Admitting: Podiatry

## 2021-04-17 NOTE — Progress Notes (Signed)
Subjective:  Patient ID: Perry Thomas, male    DOB: 1961/02/05,  MRN: [id number]  No chief complaint on file.   60 y.o. male presents for wound care.  Patient presents with a follow-up of right submetatarsal 4 ulceration.  Patient states is doing well.  He has been doing dressing changes with Betadine wet-to-dry dressing changes.  He is here for his regular dressing changes.  He is scheduled for his angiogram on the 11th   Review of Systems: Negative except as noted in the HPI. Denies N/V/F/Ch.  Past Medical History:  Diagnosis Date  . Atypical chest pain 05/30/2019  . Diabetes mellitus without complication ([place])   . Elevated PSA, less than 10 ng/ml 03/31/2017  . ESRD (end stage renal disease) on dialysis ([place]) 01/30/2016  . ESRD on dialysis ([place])   . Hypertension   . Hypertension associated with diabetes ([place]) 01/30/2016  . Nuclear sclerotic cataract of right eye 08/25/2020  . PAD (peripheral artery disease) ([place])   . Posterior subcapsular age-related cataract, right eye 08/25/2020  . Type 2 diabetes mellitus ([place]) 01/30/2016    Current Outpatient Medications:  .  amLODipine (NORVASC) 10 MG tablet, Take 1 tablet (10 mg total) by mouth daily., Disp: 30 tablet, Rfl: 11 .  aspirin 81 MG EC tablet, Take 81 mg by mouth daily., Disp: , Rfl:  .  carvedilol (COREG) 12.5 MG tablet, Take 1 tablet (12.5 mg total) by mouth 2 (two) times daily with a meal., Disp: 60 tablet, Rfl: 11 .  clopidogrel (PLAVIX) 75 MG tablet, Take 1 tablet (75 mg total) by mouth daily., Disp: 90 tablet, Rfl: 3 .  Continuous Blood Gluc Sensor (DEXCOM G6 SENSOR) MISC, Apply 1 sensor to the skin every 10 days for continuous glucose monitoring., Disp: 4 each, Rfl: 3 .  Continuous Blood Gluc Transmit (DEXCOM G6 TRANSMITTER) MISC, USE AS DIRECTED FOR CONTINUOUS GLUCOSE MONITORING. REUSE TRANSMITTER FOR 90 DAYS THEN DISCARD AND REPLACE., Disp: 1 each, Rfl: 0 .  doxycycline (VIBRA-TABS) 100 MG tablet, Take 1 tablet (100 mg total) by  mouth 2 (two) times daily. (Patient not taking: No sig reported), Disp: 20 tablet, Rfl: 0 .  doxycycline (VIBRA-TABS) 100 MG tablet, Take 1 tablet (100 mg total) by mouth 2 (two) times daily. (Patient not taking: No sig reported), Disp: 20 tablet, Rfl: 0 .  furosemide (LASIX) 80 MG tablet, Take 40 mg by mouth daily as needed for fluid., Disp: , Rfl:  .  insulin glargine (LANTUS SOLOSTAR) 100 UNIT/ML Solostar Pen, Inject 12 Units into the skin at bedtime., Disp: , Rfl:  .  Lancets (ONETOUCH ULTRASOFT) lancets, Use to check blood sugar before breakfast and before supper. E11.65, Disp: , Rfl:  .  mycophenolate (MYFORTIC) 180 MG EC tablet, Take 360 mg by mouth 2 (two) times daily., Disp: , Rfl:  .  pantoprazole (PROTONIX) 40 MG tablet, TAKE 1 TABLET BY MOUTH EVERY DAY (Patient taking differently: Take 40 mg by mouth daily.), Disp: 30 tablet, Rfl: 1 .  predniSONE (DELTASONE) 5 MG tablet, Take 5 mg by mouth daily with breakfast., Disp: , Rfl:  .  SIMBRINZA 1-0.2 % SUSP, Place 1 drop into both eyes in the morning and at bedtime., Disp: , Rfl: 4 .  sulfamethoxazole-trimethoprim (BACTRIM) 400-80 MG tablet, Take 1 tablet by mouth every Monday, Wednesday, and Friday., Disp: , Rfl:  .  tacrolimus (PROGRAF) 1 MG capsule, Take 3 mg by mouth 2 (two) times daily., Disp: , Rfl:  .  TRULICITY A999333 0000000 SOPN,  INJECT 0.'75MG'$  DOSE INTO THE SKIN ONCE WEEKLY (Patient taking differently: Inject 0.75 mg into the muscle every Sunday.), Disp: 0.5 mL, Rfl: 1  Social History   Tobacco Use  Smoking Status Former Smoker  . Quit date: 12/13/2009  . Years since quitting: 11.3  Smokeless Tobacco Never Used    No Known Allergies Objective:  There were no vitals filed for this visit. There is no height or weight on file to calculate BMI. Constitutional Well developed. Well nourished.  Vascular Dorsalis pedis pulses non palpable bilaterally. Posterior tibial pulses non palpable bilaterally. Capillary refill normal to all  digits.  No cyanosis or clubbing noted. Pedal hair growth normal.  Neurologic Normal speech. Oriented to person, place, and time. Protective sensation absent  Dermatologic Wound Location: Right submetatarsal 4 with fat layer exposed.  Some malodor present.  No purulent drainage noted.  Does not probe down to bone.  Probes down to deep tissue.  No cellulitis noted. Wound Base: Mixed Granular/Fibrotic Peri-wound: Calloused Exudate: Scant/small amount Serous exudate Wound Measurements: -None  Orthopedic: No pain to palpation either foot.   Radiographs: 3 views of skeletally mature the right foot: No osteomyelitis noted.  No soft tissue emphysema noted.  No fractures noted. Assessment:   No diagnosis found. Plan:  Patient was evaluated and treated and all questions answered.  Ulcer right submetatarsal 4 ulceration with fat layer exposed -Debridement as below. -Dressed with Betadine wet-to-dry, DSD. -Continue off-loading with surgical shoe. -ABIs PVRs were reviewed with the patient which showed significant vascular disease to both lower extremity.  -He is scheduled for an angiogram this upcoming week.  I will continue to monitor afterwards to see if there is any improvement. -Patient is a high risk of amputation given the nature of the vascular compromise as well as underlying diabetes.  Patient states understanding.  Procedure: Excisional Debridement of Wound Tool: Sharp chisel blade/tissue nipper Rationale: Removal of non-viable soft tissue from the wound to promote healing.  Anesthesia: none Pre-Debridement Wound Measurements: 0.4 cm x 0.4 cm x 0.3 cm  Post-Debridement Wound Measurements: 0.6 cm x 0.5 cm x 0.4 cm  Type of Debridement: Sharp Excisional Tissue Removed: Non-viable soft tissue Blood loss: Minimal (<50cc) Depth of Debridement: subcutaneous tissue. Technique: Sharp excisional debridement to bleeding, viable wound base.  Wound Progress: The wound depth has slightly  decreased from previous measurement.  We will continue monitor the progression of the wound Dressing: Dry, sterile, compression dressing. Disposition: Patient tolerated procedure well. Patient to return in 1 week for follow-up.  No follow-ups on file.

## 2021-04-20 NOTE — H&P (Signed)
Chief Complaint: Patient was seen in consultation today for RLE diabetic ulcer  Referring Physician(s): Dr. Boneta Lucks  Supervising Physician: Corrie Mckusick  Patient Status: [facility] - Out-pt  History of Present Illness: Perry Thomas is a 60 y.o. 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 Perry Thomas was seen in consultation with Dr. Earleen Newport 03/26/21 to discuss vascular intervention to improve healing potential of his chronic wound. After discussion, Perry Thomas elected to proceed with scheduling aorto-peripheral angiogram with intervention.  He presents to [facility] for procedure today.  He reports his foot wound has improved slightly.  He is otherwise without new complaint.  He has been NPO for the procedure today.  He is on Plavix 75 mg and aspirin 81 mg daily and reports compliance with this.   Past Medical History:  Diagnosis Date  . Atypical chest pain 05/30/2019  . Diabetes mellitus without complication ([place])   . Elevated PSA, less than 10 ng/ml 03/31/2017  . ESRD (end stage renal disease) on dialysis ([place]) 01/30/2016  . ESRD on dialysis ([place])   . Hypertension   . Hypertension associated with diabetes ([place]) 01/30/2016  . Nuclear sclerotic cataract of right eye 08/25/2020  . PAD (peripheral artery disease) ([place])   . Posterior subcapsular age-related cataract, right eye 08/25/2020  . Type 2 diabetes mellitus ([place]) 01/30/2016    Past Surgical History:  Procedure Laterality Date  . IR RADIOLOGIST EVAL & MGMT  03/26/2021  . PTCA     peripheral artery disease    Allergies: Patient has no known allergies.  Medications: Prior to Admission medications   Medication Sig Start Date End Date Taking? Authorizing Provider  amLODipine (NORVASC) 10 MG tablet Take 1 tablet (10 mg total) by mouth daily. 10/23/20  Yes Jose Persia, MD  aspirin 81 MG EC tablet Take 81 mg by mouth daily. 09/05/19  Yes [provider]  carvedilol (COREG) 12.5 MG tablet Take 1 tablet (12.5 mg total) by mouth 2 (two) times daily with a meal. 10/23/20  Yes Jose Persia, MD  clopidogrel (PLAVIX) 75 MG tablet Take 1 tablet (75 mg total) by mouth daily. 10/23/20  Yes Jose Persia, MD  furosemide (LASIX) 80 MG tablet Take 40 mg by mouth daily as needed for fluid. 03/17/20  Yes [provider]  insulin glargine (LANTUS SOLOSTAR) 100 UNIT/ML Solostar Pen Inject 12 Units into the skin at bedtime.   Yes [provider]  mycophenolate (MYFORTIC) 180 MG EC tablet Take 360 mg by mouth 2 (two) times daily. 12/04/19  Yes [provider]  pantoprazole (PROTONIX) 40 MG tablet TAKE 1 TABLET BY MOUTH EVERY DAY Patient taking differently: Take 40 mg by mouth daily. 03/04/21  Yes Gaylan Gerold, DO  predniSONE (DELTASONE) 5 MG tablet Take 5 mg by mouth daily with breakfast. 04/03/20  Yes [provider]  SIMBRINZA 1-0.2 % SUSP Place 1 drop into both eyes in the morning and at bedtime. 09/06/17  Yes [provider]  sulfamethoxazole-trimethoprim (BACTRIM) 400-80 MG tablet Take 1 tablet by mouth every Monday, Wednesday, and Friday. 03/27/21  Yes [provider]  tacrolimus (PROGRAF) 1 MG capsule Take 3 mg by mouth 2 (two) times daily. 05/01/20  Yes [provider]  TRULICITY 5.36 UY/4.0HK SOPN INJECT 0.75MG DOSE INTO THE SKIN ONCE WEEKLY Patient taking differently: Inject 0.75 mg into the muscle every Sunday. 03/09/21  Yes Sanjuan Dame, MD  Continuous Blood Gluc Sensor [facility]  G6 SENSOR) MISC Apply 1 sensor to the skin every 10 days for continuous glucose monitoring. 12/10/20   Mosetta Anis, MD  Continuous Blood Gluc Transmit (DEXCOM G6 TRANSMITTER) MISC USE AS DIRECTED FOR CONTINUOUS GLUCOSE MONITORING. REUSE TRANSMITTER FOR 90 DAYS THEN DISCARD AND REPLACE. 01/29/21   Mosetta Anis, MD  doxycycline (VIBRA-TABS) 100 MG tablet Take 1 tablet (100 mg total) by mouth 2 (two)  times daily. Patient not taking: No sig reported 03/13/21   Felipa Furnace, DPM  doxycycline (VIBRA-TABS) 100 MG tablet Take 1 tablet (100 mg total) by mouth 2 (two) times daily. Patient not taking: No sig reported 03/13/21   Felipa Furnace, DPM  Lancets [facility] ULTRASOFT) lancets Use to check blood sugar before breakfast and before supper. E11.65 05/15/20   [provider]     Family History  Problem Relation Age of Onset  . Healthy Mother   . Liver cancer Father   . Kidney disease Sister     Social History   Socioeconomic History  . Marital status: Single    Spouse name: Not on file  . Number of children: 1  . Years of education: 41  . Highest education level: Associate degree: occupational, Hotel manager, or vocational program  Occupational History  . Occupation: disability    Comment: retired Clinical biochemist  Tobacco Use  . Smoking status: Former Smoker    Quit date: 12/13/2009    Years since quitting: 11.3  . Smokeless tobacco: Never Used  Vaping Use  . Vaping Use: Never used  Substance and Sexual Activity  . Alcohol use: No    Alcohol/week: 0.0 standard drinks  . Drug use: No  . Sexual activity: Not on file  Other Topics Concern  . Not on file  Social History Narrative   Lives with niece in an apartment on the first floor.  On disability for dialysis.     Social Determinants of Health   Financial Resource Strain: Not on file  Food Insecurity: Not on file  Transportation Needs: Not on file  Physical Activity: Not on file  Stress: Not on file  Social Connections: Not on file    Review of Systems: A 12 point ROS discussed and pertinent positives are indicated in the HPI above.  All other systems are negative.  Review of Systems  Constitutional: Negative for fatigue and fever.  Respiratory: Negative for cough and shortness of breath.   Cardiovascular: Negative for chest pain.  Gastrointestinal: Negative for abdominal pain, nausea and vomiting.  Musculoskeletal:  Negative for back pain.  Skin: Positive for wound (palmar R foot).  Psychiatric/Behavioral: Negative for behavioral problems and confusion.      Vital Signs: BP (!) 163/92   Pulse 78   Temp 98 F (36.7 C) (Oral)   Ht 5' 9"  (1.753 m)   Wt 243 lb (110.2 kg)   SpO2 99%   BMI 35.88 kg/m   Physical Exam Vitals and nursing note reviewed.  Constitutional:      General: He is not in acute distress.    Appearance: Normal appearance. He is not ill-appearing.  HENT:     Mouth/Throat:     Mouth: Mucous membranes are moist.     Pharynx: Oropharynx is clear.  Cardiovascular:     Rate and Rhythm: Normal rate and regular rhythm.     Comments: DP and PT identified by doppler bilaterally Pulmonary:     Effort: Pulmonary effort is normal. No respiratory distress.     Breath  sounds: Normal breath sounds.  Abdominal:     General: Abdomen is flat.     Palpations: Abdomen is soft.  Skin:    General: Skin is warm and dry.  Neurological:     General: No focal deficit present.     Mental Status: He is alert and oriented to person, place, and time. Mental status is at baseline.  Psychiatric:        Mood and Affect: Mood normal.        Behavior: Behavior normal.        Thought Content: Thought content normal.        Judgment: Judgment normal.      MD Evaluation Airway: WNL Heart: WNL Abdomen: WNL Chest/ Lungs: WNL ASA  Classification: 3 Mallampati/Airway Score: One   Imaging: OCT, Retina - OU - Both Eyes  Result Date: 03/30/2021 Right Eye Quality was good. Scan locations included subfoveal. Central Foveal Thickness: 263. Progression has improved. Findings include abnormal foveal contour. Left Eye Quality was good. Scan locations included subfoveal. Central Foveal Thickness: 261. Progression has improved. Findings include abnormal foveal contour. Notes Much improved CSME OD, will observe CSME OS nearly resolved post focal and previous laser treatment.  We will continue to observe OS  today  IR Radiologist Eval & Mgmt  Result Date: 03/26/2021 Please refer to notes tab for details about interventional procedure. (Op Note)   Labs:  CBC: No results for input(s): WBC, HGB, HCT, PLT in the last 8760 hours.  COAGS: No results for input(s): INR, APTT in the last 8760 hours.  BMP: No results for input(s): NA, K, CL, CO2, GLUCOSE, BUN, CALCIUM, CREATININE, GFRNONAA, GFRAA in the last 8760 hours.  Invalid input(s): CMP  LIVER FUNCTION TESTS: No results for input(s): BILITOT, AST, ALT, ALKPHOS, PROT, ALBUMIN in the last 8760 hours.  TUMOR MARKERS: No results for input(s): AFPTM, CEA, CA199, CHROMGRNA in the last 8760 hours.  Assessment and Plan: Patient with past medical history of DM, ESRD, HTN presents with complaint of persistent ulcer to the right submetatarsal with fat layer exposure, evidence of vascular compromise.  IR consulted for angiogram with angioplasty vs. stenting at the request of Dr. Posey Pronto. Case reviewed by Dr. Earleen Newport who met with the patient in consultation 4/14 and approves patient for procedure.  Patient presents today in their usual state of health.  He has been NPO and is not currently on blood thinners.  Labs are pending, however there is a plan to use CO2 for renal protection of his kidney transplant today.   Risks and benefits of angiogram with intervention were discussed with the patient including, but not limited to bleeding, infection, vascular injury or contrast induced renal failure.  This interventional procedure involves the use of X-rays and because of the nature of the planned procedure, it is possible that we will have prolonged use of X-ray fluoroscopy.  Potential radiation risks to you include (but are not limited to) the following: - A slightly elevated risk for cancer  several years later in life. This risk is typically less than 0.5% percent. This risk is low in comparison to the normal incidence of human cancer, which is 33%  for women and 50% for men according to the [place]. - Radiation induced injury can include skin redness, resembling a rash, tissue breakdown / ulcers and hair loss (which can be temporary or permanent).   The likelihood of either of these occurring depends on the difficulty of the procedure and whether you  are sensitive to radiation due to previous procedures, disease, or genetic conditions.   IF your procedure requires a prolonged use of radiation, you will be notified and given written instructions for further action.  It is your responsibility to monitor the irradiated area for the 2 weeks following the procedure and to notify your physician if you are concerned that you have suffered a radiation induced injury.    All of the patient's questions were answered, patient is agreeable to proceed.  Consent signed and in chart.  Thank you for this interesting consult.  I greatly enjoyed meeting Perry Thomas and look forward to participating in their care.  A copy of this report was sent to the requesting provider on this date.  Electronically Signed: Docia Barrier, PA 04/22/2021, 8:09 AM   I spent a total of  30 Minutes   in face to face in clinical consultation, greater than 50% of which was counseling/coordinating care for RLE diabetic ulcer.

## 2021-04-21 ENCOUNTER — Other Ambulatory Visit: Payer: Self-pay | Admitting: Student

## 2021-04-22 ENCOUNTER — Other Ambulatory Visit (HOSPITAL_COMMUNITY): Payer: Self-pay | Admitting: Interventional Radiology

## 2021-04-22 ENCOUNTER — Other Ambulatory Visit: Payer: Self-pay

## 2021-04-22 ENCOUNTER — Encounter (HOSPITAL_COMMUNITY): Payer: Self-pay

## 2021-04-22 ENCOUNTER — Ambulatory Visit (HOSPITAL_COMMUNITY)
Admission: RE | Admit: 2021-04-22 | Discharge: 2021-04-22 | Disposition: A | Discharge status: Home / Self Care | Payer: Medicaid Other | Source: Ambulatory Visit | Attending: Interventional Radiology | Admitting: Interventional Radiology

## 2021-04-22 DIAGNOSIS — L97519 Non-pressure chronic ulcer of other part of right foot with unspecified severity: Secondary | ICD-10-CM

## 2021-04-22 DIAGNOSIS — Z87891 Personal history of nicotine dependence: Secondary | ICD-10-CM | POA: Insufficient documentation

## 2021-04-22 DIAGNOSIS — Z794 Long term (current) use of insulin: Secondary | ICD-10-CM | POA: Insufficient documentation

## 2021-04-22 DIAGNOSIS — I70235 Atherosclerosis of native arteries of right leg with ulceration of other part of foot: Secondary | ICD-10-CM | POA: Insufficient documentation

## 2021-04-22 DIAGNOSIS — Z79899 Other long term (current) drug therapy: Secondary | ICD-10-CM | POA: Insufficient documentation

## 2021-04-22 DIAGNOSIS — Z7902 Long term (current) use of antithrombotics/antiplatelets: Secondary | ICD-10-CM | POA: Diagnosis not present

## 2021-04-22 DIAGNOSIS — E11621 Type 2 diabetes mellitus with foot ulcer: Secondary | ICD-10-CM | POA: Diagnosis not present

## 2021-04-22 DIAGNOSIS — Z7982 Long term (current) use of aspirin: Secondary | ICD-10-CM | POA: Insufficient documentation

## 2021-04-22 DIAGNOSIS — E1122 Type 2 diabetes mellitus with diabetic chronic kidney disease: Secondary | ICD-10-CM | POA: Insufficient documentation

## 2021-04-22 DIAGNOSIS — N186 End stage renal disease: Secondary | ICD-10-CM | POA: Diagnosis not present

## 2021-04-22 DIAGNOSIS — I120 Hypertensive chronic kidney disease with stage 5 chronic kidney disease or end stage renal disease: Secondary | ICD-10-CM | POA: Insufficient documentation

## 2021-04-22 DIAGNOSIS — E1151 Type 2 diabetes mellitus with diabetic peripheral angiopathy without gangrene: Secondary | ICD-10-CM | POA: Diagnosis not present

## 2021-04-22 HISTORY — PX: IR ANGIOGRAM EXTREMITY RIGHT: IMG652

## 2021-04-22 HISTORY — PX: IR US GUIDE VASC ACCESS RIGHT: IMG2390

## 2021-04-22 HISTORY — PX: IR TIB-PERO ART ATHEREC INC PTA MOD SED: IMG2314

## 2021-04-22 LAB — BASIC METABOLIC PANEL
Anion gap: 5 (ref 5–15)
BUN: 16 mg/dL (ref 8–23)
CO2: 26 mmol/L (ref 22–32)
Calcium: 9.3 mg/dL (ref 8.9–10.3)
Chloride: 108 mmol/L (ref 98–111)
Creatinine, Ser: 0.86 mg/dL (ref 0.61–1.24)
GFR, Estimated: >60 mL/min (ref >60)
Glucose, Bld: 118 mg/dL — ABNORMAL HIGH (ref 70–99)
Potassium: 4.6 mmol/L (ref 3.5–5.1)
Sodium: 139 mmol/L (ref 135–145)

## 2021-04-22 LAB — CBC
HCT: 43.1 % (ref 39.0–52.0)
Hemoglobin: 13.5 g/dL (ref 13.0–17.0)
MCH: 29.2 pg (ref 26.0–34.0)
MCHC: 31.3 g/dL (ref 30.0–36.0)
MCV: 93.1 fL (ref 80.0–100.0)
Platelets: 230 10*3/uL (ref 150–400)
RBC: 4.63 MIL/uL (ref 4.22–5.81)
RDW: 12.7 % (ref 11.5–15.5)
WBC: 4.7 10*3/uL (ref 4.0–10.5)
nRBC: 0 % (ref 0.0–0.2)

## 2021-04-22 LAB — GLUCOSE, CAPILLARY
Glucose-Capillary: 107 mg/dL — ABNORMAL HIGH (ref 70–99)
Glucose-Capillary: 121 mg/dL — ABNORMAL HIGH (ref 70–99)
Glucose-Capillary: 117 mg/dL — ABNORMAL HIGH (ref 70–99)

## 2021-04-22 LAB — PROTIME-INR
INR: 1.1 (ref 0.8–1.2)
Prothrombin Time: 13.8 seconds (ref 11.4–15.2)

## 2021-04-22 LAB — POCT ACTIVATED CLOTTING TIME
Activated Clotting Time: 220 seconds
Activated Clotting Time: 226 seconds

## 2021-04-22 MED ORDER — SODIUM CHLORIDE 0.9 % IV SOLN: INTRAVENOUS | Status: DC

## 2021-04-22 MED ORDER — PROTAMINE SULFATE 10 MG/ML IV SOLN
25.0000 mg | Freq: Once | INTRAVENOUS | Status: AC
  Administered 2021-04-22: 25 mg via INTRAVENOUS
  Filled 2021-04-22: qty 2.5

## 2021-04-22 MED ORDER — HEPARIN SODIUM (PORCINE) 1000 UNIT/ML IJ SOLN
INTRAMUSCULAR | Status: AC | PRN
  Administered 2021-04-22: 10000 [IU] via INTRAVENOUS
  Administered 2021-04-22: 3000 [IU] via INTRAVENOUS

## 2021-04-22 MED ORDER — NITROGLYCERIN 1 MG/10 ML FOR IR/CATH LAB
100.0000 ug | Freq: Once | INTRA_ARTERIAL | Status: DC
  Filled 2021-04-22 (×2): qty 10

## 2021-04-22 MED ORDER — MIDAZOLAM HCL 2 MG/2ML IJ SOLN
INTRAMUSCULAR | Status: AC
  Filled 2021-04-22: qty 2

## 2021-04-22 MED ORDER — LIDOCAINE HCL 1 % IJ SOLN
INTRAMUSCULAR | Status: AC
  Administered 2021-04-22: 8 mL via SUBCUTANEOUS
  Filled 2021-04-22: qty 20

## 2021-04-22 MED ORDER — ASPIRIN EC 81 MG PO TBEC
567.0000 mg | DELAYED_RELEASE_TABLET | Freq: Once | ORAL | Status: AC
  Administered 2021-04-22: 567 mg via ORAL
  Filled 2021-04-22: qty 7

## 2021-04-22 MED ORDER — CLOPIDOGREL BISULFATE 75 MG PO TABS
ORAL_TABLET | ORAL | Status: AC
  Administered 2021-04-22: 225 mg via ORAL
  Filled 2021-04-22: qty 3

## 2021-04-22 MED ORDER — FENTANYL CITRATE (PF) 100 MCG/2ML IJ SOLN
INTRAMUSCULAR | Status: AC
  Filled 2021-04-22: qty 2

## 2021-04-22 MED ORDER — MIDAZOLAM HCL 2 MG/2ML IJ SOLN
INTRAMUSCULAR | Status: AC | PRN
  Administered 2021-04-22: .25 mg via INTRAVENOUS
  Administered 2021-04-22 (×5): 0.5 mg via INTRAVENOUS
  Administered 2021-04-22 (×2): .25 mg via INTRAVENOUS
  Administered 2021-04-22: 1 mg via INTRAVENOUS
  Administered 2021-04-22: .25 mg via INTRAVENOUS

## 2021-04-22 MED ORDER — FENTANYL CITRATE (PF) 100 MCG/2ML IJ SOLN
INTRAMUSCULAR | Status: AC | PRN
  Administered 2021-04-22: 12.5 ug via INTRAVENOUS
  Administered 2021-04-22 (×2): 25 ug via INTRAVENOUS
  Administered 2021-04-22: 12.5 ug via INTRAVENOUS
  Administered 2021-04-22: 25 ug via INTRAVENOUS
  Administered 2021-04-22 (×2): 12.5 ug via INTRAVENOUS
  Administered 2021-04-22 (×2): 25 ug via INTRAVENOUS
  Administered 2021-04-22: 50 ug via INTRAVENOUS

## 2021-04-22 MED ORDER — HEPARIN SODIUM (PORCINE) 1000 UNIT/ML IJ SOLN
INTRAMUSCULAR | Status: AC
  Filled 2021-04-22: qty 1

## 2021-04-22 MED ORDER — FENTANYL CITRATE (PF) 100 MCG/2ML IJ SOLN
INTRAMUSCULAR | Status: AC
  Filled 2021-04-22: qty 4

## 2021-04-22 MED ORDER — ALTEPLASE 2 MG IJ SOLR
INTRAMUSCULAR | Status: AC
  Administered 2021-04-22: 2 mg via INTRA_ARTERIAL
  Filled 2021-04-22: qty 4

## 2021-04-22 MED ORDER — MIDAZOLAM HCL 2 MG/2ML IJ SOLN
INTRAMUSCULAR | Status: AC
  Filled 2021-04-22: qty 4

## 2021-04-22 MED ORDER — IOHEXOL 300 MG/ML  SOLN
50.0000 mL | Freq: Once | INTRAMUSCULAR | Status: AC | PRN
  Administered 2021-04-22: 20 mL via INTRA_ARTERIAL

## 2021-04-22 MED ORDER — CLOPIDOGREL BISULFATE 75 MG PO TABS: 225.0000 mg | ORAL_TABLET | Freq: Once | ORAL | Status: AC

## 2021-04-22 MED ORDER — IOHEXOL 300 MG/ML  SOLN
50.0000 mL | Freq: Once | INTRAMUSCULAR | Status: AC | PRN
  Administered 2021-04-22: 40 mL via INTRA_ARTERIAL

## 2021-04-22 NOTE — Sedation Documentation (Signed)
Doctor holding pressure right femoral

## 2021-04-22 NOTE — Sedation Documentation (Signed)
ACT 226 notified doctor.

## 2021-04-22 NOTE — Sedation Documentation (Signed)
IR tech holding pressure right femoral

## 2021-04-22 NOTE — Sedation Documentation (Signed)
CGG 121 doctor notified.

## 2021-04-22 NOTE — Sedation Documentation (Signed)
CBG 117 

## 2021-04-22 NOTE — Sedation Documentation (Signed)
IR tech continues to hold pressure right femoral.

## 2021-04-22 NOTE — Sedation Documentation (Signed)
ACT 220 notified Dr Earleen Newport

## 2021-04-22 NOTE — Discharge Instructions (Signed)
Angiogram, Care After This sheet gives you information about how to care for yourself after your procedure. Your health care provider may also give you more specific instructions. If you have problems or questions, contact your health care provider. What can I expect after the procedure? After the procedure, it is common to have:  Bruising and tenderness at the catheter insertion area.  A collection of blood (hematoma) at the insertion area. This may feel like a small lump under the skin at the insertion site. Follow these instructions at home: Insertion site care  Follow instructions from your health care provider about how to take care of your insertion site. Make sure you: ? Wash your hands with soap and water before and after you change your bandage (dressing). If soap and water are not available, use hand sanitizer. ? Change your dressing as told by your health care provider.  Do not take baths, swim, or use a hot tub until your health care provider approves.  You may shower 24-48 hours after the procedure, or as told by your health care provider. To clean the insertion site: ? Gently wash the area with plain soap and water. ? Pat the area dry with a clean towel. ? Do not rub the site. This may cause bleeding.  Check your insertion site every day for signs of infection. Check for: ? Redness, swelling, or pain. ? Fluid or blood. ? Warmth. ? Pus or a bad smell.  Do not apply powder or lotion to the site. Keep the site clean and dry.   Activity  Do not drive for 24 hours if you were given a sedative during your procedure.  Rest as told by your health care provider, usually for 1-2 days.  Do not lift anything that is heavier than 10 lb (4.5 kg), or the limit that you are told, until your health care provider says that it is safe.  If the insertion site was in your leg, try to avoid stairs for a few days.  Return to your normal activities as told by your health care provider,  usually in about a week. Ask your health care provider what activities are safe for you. General instructions  If your insertion site starts bleeding, lie flat and put pressure on the site. If the bleeding does not stop, get help right away. This is a medical emergency.  Take over-the-counter and prescription medicines only as told by your health care provider.  Drink enough fluid to keep your urine pale yellow. This helps flush the contrast dye from your body.  Keep all follow-up visits as told by your health care provider. This is important.   Contact a health care provider if:  You have a fever or chills.  You have redness, swelling, or pain around your insertion site.  You have fluid or blood coming from your insertion site.  Your insertion site feels warm to the touch.  You have pus or a bad smell coming from your insertion site.  You have more bruising around the insertion site. Get help right away if you have:  A problem with the insertion area, such as: ? The area swells fast or bleeds even after you apply pressure. ? The area becomes pale, cool, tingly, or numb.  Chest pain.  Trouble breathing.  A rash.  Any symptoms of a stroke. "BE FAST" is an easy way to remember the main warning signs of a stroke: ? B - Balance. Signs are dizziness, sudden trouble walking,   or loss of balance. ? E - Eyes. Signs are trouble seeing or a sudden change in vision. ? F - Face. Signs are sudden weakness or loss of feeling of the face, or the face or eyelid drooping on one side. ? A - Arms. Signs are weakness or loss of feeling in an arm. This happens suddenly and usually on one side of the body. ? S - Speech. Signs are sudden trouble speaking, slurred speech, or trouble understanding what people say. ? T - Time. Time to call emergency services. Write down what time symptoms started.  You have other signs of a stroke, such as: ? A sudden, severe headache with no known cause. ? Nausea  or vomiting. ? Seizure. These symptoms may represent a serious problem that is an emergency. Do not wait to see if the symptoms will go away. Get medical help right away. Call your local emergency services (911 in the [place] Do not drive yourself to the hospital. Summary  It is common to have bruising and tenderness at the catheter insertion area.  Do not take baths, swim, or use a hot tub until your health care provider approves. You may shower 24-48 hours after the procedure or as told.  It is important to rest and drink plenty of fluids.  If the insertion site bleeds, lie flat and put pressure on the site. If the bleeding continues, get help right away. This is a medical emergency. This information is not intended to replace advice given to you by your health care provider. Make sure you discuss any questions you have with your health care provider. Document Revised: 10/03/2019 Document Reviewed: 10/03/2019 Elsevier Patient Education  2021 Elsevier Inc.  

## 2021-04-22 NOTE — Procedures (Signed)
Interventional Radiology Procedure Note  Procedure:   US guided right CFA access US guided right AT retrograde access Right lower extremity angio and treatment of occluded AT with laser atherectomy and PTA  Findings: AT and PT pulses maintained at completion .  Complications: None Recommendations:  - right hip straight x 4 hours - routine wound care - advance diet - Do not submerge for 7 days - DC after 4 hours when goals met - continue dual anti-platelet medication - follow up with Dr. Earleen Newport in 4-6 weeks in clinic - continue wound care  Signed,  Dulcy Fanny. Earleen Newport, DO

## 2021-04-22 NOTE — Sedation Documentation (Signed)
Blood bank called short stay. They have a pink tube for type and screen no order. Spoke with Dr Earleen Newport no order for type and screen is needed. Blood bank notified.

## 2021-04-23 ENCOUNTER — Other Ambulatory Visit: Payer: Self-pay | Admitting: Internal Medicine

## 2021-04-23 ENCOUNTER — Other Ambulatory Visit: Payer: Self-pay | Admitting: Student

## 2021-04-28 ENCOUNTER — Encounter: Payer: Self-pay | Admitting: Podiatry

## 2021-04-28 ENCOUNTER — Ambulatory Visit (INDEPENDENT_AMBULATORY_CARE_PROVIDER_SITE_OTHER): Payer: Medicaid Other | Admitting: Podiatry

## 2021-04-28 ENCOUNTER — Other Ambulatory Visit: Payer: Self-pay

## 2021-04-28 DIAGNOSIS — D689 Coagulation defect, unspecified: Secondary | ICD-10-CM | POA: Diagnosis not present

## 2021-04-28 DIAGNOSIS — B351 Tinea unguium: Secondary | ICD-10-CM

## 2021-04-28 DIAGNOSIS — I739 Peripheral vascular disease, unspecified: Secondary | ICD-10-CM | POA: Diagnosis not present

## 2021-04-28 DIAGNOSIS — E1122 Type 2 diabetes mellitus with diabetic chronic kidney disease: Secondary | ICD-10-CM

## 2021-04-28 DIAGNOSIS — M79676 Pain in unspecified toe(s): Secondary | ICD-10-CM

## 2021-04-28 DIAGNOSIS — E119 Type 2 diabetes mellitus without complications: Secondary | ICD-10-CM

## 2021-04-28 DIAGNOSIS — N186 End stage renal disease: Secondary | ICD-10-CM | POA: Diagnosis not present

## 2021-04-28 DIAGNOSIS — L97512 Non-pressure chronic ulcer of other part of right foot with fat layer exposed: Secondary | ICD-10-CM

## 2021-04-28 DIAGNOSIS — Z992 Dependence on renal dialysis: Secondary | ICD-10-CM

## 2021-04-28 DIAGNOSIS — Z794 Long term (current) use of insulin: Secondary | ICD-10-CM

## 2021-04-28 NOTE — Progress Notes (Signed)
Patient ID: Perry Thomas, male   DOB: 1961-01-30, 60 y.o.   MRN: [id number] Complaint:  Visit Type: Patient returns to my office for continued preventative foot care services. Complaint: Patient states" my nails have grown long and thick and become painful to walk and wear shoes" Patient has been diagnosed with DM with no foot complications.  Patient has history of kidney transplant.. The patient presents for preventative foot care services. No changes to ROS.  Patient is taking plavix which causes coagulation defect.    Podiatric Exam: Vascular: dorsalis pedis and posterior tibial pulses are weakly palpable bilateral. Capillary return is immediate. Cold feet. Skin turgor WNL Absent digital hair  B/L. Sensorium: Normal Semmes Weinstein monofilament test. Normal tactile sensation bilaterally. Nail Exam: Pt has thick disfigured discolored nails with subungual debris noted bilateral entire nail hallux through fifth toenails Ulcer Exam: There is no evidence of ulcer or pre-ulcerative changes or infection. Orthopedic Exam: Muscle tone and strength are WNL. No limitations in general ROM. No crepitus or effusions noted. Foot type and digits show no abnormalities. Bony prominences are unremarkable. Skin:  Porokeratosis sub 5 left.. No infection or ulcers.  Healing skin ulcer right forefoot.  Diagnosis:  Onychomycosis, , Pain in right toe, pain in left toes    Treatment & Plan Procedures and Treatment: Consent by patient was obtained for treatment procedures. The patient understood the discussion of treatment and procedures well. All questions were answered thoroughly reviewed. Debridement of mycotic and hypertrophic toenails, 1 through 5 bilateral and clearing of subungual debris. No ulceration, no infection noted.  Return Visit-Office Procedure: Patient instructed to return to the office for a follow up visit 10 weeks  for continued evaluation and treatment.    Gardiner Barefoot DPM

## 2021-04-29 ENCOUNTER — Other Ambulatory Visit: Payer: Self-pay | Admitting: Interventional Radiology

## 2021-04-29 DIAGNOSIS — I729 Aneurysm of unspecified site: Secondary | ICD-10-CM

## 2021-04-30 ENCOUNTER — Other Ambulatory Visit: Payer: Self-pay

## 2021-04-30 ENCOUNTER — Ambulatory Visit (HOSPITAL_COMMUNITY)
Admission: RE | Admit: 2021-04-30 | Discharge: 2021-04-30 | Disposition: A | Discharge status: Home / Self Care | Payer: Medicaid Other | Source: Ambulatory Visit | Attending: Interventional Radiology | Admitting: Interventional Radiology

## 2021-04-30 DIAGNOSIS — I729 Aneurysm of unspecified site: Secondary | ICD-10-CM | POA: Diagnosis not present

## 2021-04-30 NOTE — Progress Notes (Signed)
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  His procedure was uncomplicated and technically successful.  He was discharged home the same day without issue.  Since returning home, [name] has noticed that a "knot" has developed in his right groin.  He states he is not painful, no tender to the touch, it seemed to change in size at initial presentation from first to second day but since then has largely stayed the same.  He presents today for evaluation of his groin vs. Procedure site.   [name] is assessed in IR today after vascular US.  He does have a palpable, well-circumscribed knot in the inguinal right groin.  This is acutally several centimeters away from the procedure site which has healed well.  The procedure site is intact with fresh scar.  No drainage or erythema.  The "knot" in his groin is not tender to touch, there is no drainage, no warmth, no erythema. He denies fever, chills, limited ROM, abdominal pain, dysuria, hematuria, thigh swelling, edema.    Allergies: Patient has no known allergies.  Medications: Prior to Admission medications   Medication Sig Start Date End Date Taking? Authorizing Provider  amLODipine (NORVASC) 10 MG tablet Take 1 tablet (10 mg total) by mouth daily. 10/23/20   Jose Persia, MD  aspirin 81 MG EC tablet Take 81 mg by mouth daily. 09/05/19   [provider]  carvedilol (COREG) 12.5 MG tablet Take 1 tablet (12.5 mg total) by mouth 2 (two) times daily with a meal. 10/23/20   Jose Persia, MD  clopidogrel (PLAVIX) 75 MG tablet Take 1 tablet (75 mg total) by mouth daily. 10/23/20   Jose Persia, MD  Continuous  Blood Gluc Sensor (DEXCOM G6 SENSOR) MISC APPLY 1 SENSOR TO THE SKIN EVERY 10 DAYS FOR CONTINUOUS GLUCOSE MONITORING. 04/23/21   Mosetta Anis, MD  Continuous Blood Gluc Transmit (DEXCOM G6 TRANSMITTER) MISC USE AS DIRECTED FOR CONTINUOUS GLUCOSE MONITORING. USE FOR 90 DAYS THEN DISCARD & REPLACE 04/23/21   Mosetta Anis, MD  doxycycline (VIBRA-TABS) 100 MG tablet Take 1 tablet (100 mg total) by mouth 2 (two) times daily. 03/13/21   Felipa Furnace, DPM  doxycycline (VIBRA-TABS) 100 MG tablet Take 1 tablet (100 mg total) by mouth 2 (two) times daily. 03/13/21   Felipa Furnace, DPM  Dulaglutide (TRULICITY) A999333 0000000 SOPN Inject 0.75 mg into the muscle every Sunday. 04/26/21   Riesa Pope, MD  furosemide (LASIX) 80 MG tablet Take 40 mg by mouth daily as needed for fluid. 03/17/20   [provider]  insulin glargine (LANTUS SOLOSTAR) 100 UNIT/ML Solostar Pen Inject 12 Units into the skin at bedtime.    [provider]  Lancets [facility] ULTRASOFT) lancets Use to check blood sugar before breakfast and before supper. E11.65 05/15/20   [provider]  mycophenolate (MYFORTIC) 180 MG EC tablet Take 360 mg by mouth 2 (two) times daily. 12/04/19   [provider]  pantoprazole (PROTONIX) 40 MG tablet TAKE 1 TABLET BY MOUTH EVERY DAY Patient taking differently: Take 40 mg by mouth daily. 03/04/21   Gaylan Gerold, DO  predniSONE (DELTASONE) 5 MG tablet Take 5 mg by mouth daily  with breakfast. 04/03/20   [provider]  SIMBRINZA 1-0.2 % SUSP Place 1 drop into both eyes in the morning and at bedtime. 09/06/17   [provider]  sulfamethoxazole-trimethoprim (BACTRIM) 400-80 MG tablet Take 1 tablet by mouth every Monday, Wednesday, and Friday. 03/27/21   [provider]  tacrolimus (PROGRAF) 1 MG capsule Take 3 mg by mouth 2 (two) times daily. 05/01/20   [provider]     Vital Signs: There were no vitals taken for this visit.  Physical  Exam  NAD, alert, lying in bed.  Abdomen: soft, non-tender, non-distended Groin: Procedure site on the right is intact with signs of healing.  No drainage or erythema.  No warmth.  Medial to this by several centimeters is a well-circumscribed, discrete, hardened, palpable knot. Does not appear mobile.  No abnormality on the left.  Foot: RLE wrapped.  Mild edema consistent with prior exam.   Imaging: VAS [place] GROIN PSEUDOANEURYSM  Result Date: 04/30/2021  ARTERIAL PSEUDOANEURYSM  Patient Name:  Perry Thomas  Date of Exam:   04/30/2021 Medical Rec #: [id number]     Accession #:    [id number] Date of Birth: 1961/11/14      Patient Gender: M Patient Age:   60Y Exam Location:  [facility] Procedure:      VAS [place] Gloriajean Dell Referring Phys: [id number] Corrie Mckusick --------------------------------------------------------------------------------  Exam: Right groin Indications: Patient complains of "bulge" in groin post op. History: S/P Right lower extremity angio and treatment of occluded AT with laser atherectomy and PTA. Comparison Study: No prior study on file Performing Technologist: Sharion Dove RVS  Examination Guidelines: A complete evaluation includes B-mode imaging, spectral Doppler, color Doppler, and power Doppler as needed of all accessible portions of each vessel. Bilateral testing is considered an integral part of a complete examination. Limited examinations for reoccurring indications may be performed as noted.  Summary: No evidence of pseudoaneurysm, AVF or DVT    --------------------------------------------------------------------------------    Preliminary     Labs:  CBC: Recent Labs    04/22/21 0742  WBC 4.7  HGB 13.5  HCT 43.1  PLT 230    COAGS: Recent Labs    04/22/21 0742  INR 1.1    BMP: Recent Labs    04/22/21 0742  NA 139  K 4.6  CL 108  CO2 26  GLUCOSE 118*  BUN 16  CALCIUM 9.3  CREATININE 0.86  GFRNONAA >60    LIVER FUNCTION TESTS: No  results for input(s): BILITOT, AST, ALT, ALKPHOS, PROT, ALBUMIN in the last 8760 hours.  Assessment and Plan: Right diabetic foot ulcer s/p PTA with laser atherectomy Patient presents to IR today earlier than expected for post-procedure follow-up due to the presence of a knot in a right groin.  The knot he describes is palpable, non-tender, without erythema or warmth, no signs of infection.  Vascular US performed and preliminary report does not show any vascular abnormality.  Question whether this may be reactive from the procedure last week as patient is confident this was not presents prior to procedure.  Discussed with Dr. Earleen Newport who recommends conservative management with alternating ice and heat.  Return for expected post-procedure follow-up in 4-6 weeks for reassessment.  Patient is otherwise doing well from a procedure standpoint.  His RLE wound is stable per his report.  He has had increased tingling/sensation in the foot since procedure with mild swelling. He has follow-up scheduled with Dr. Posey Pronto next week.   Electronically Signed:  Docia Barrier, PA 04/30/2021, 9:22 AM   I spent a total of 15 Minutes at the the patient's bedside AND on the patient's hospital floor or unit, greater than 50% of which was counseling/coordinating care for right diabetic foot ulcer, lymphadenitis.

## 2021-04-30 NOTE — Progress Notes (Signed)
VASCULAR LAB    Right groin ultrasound to rule out pseudoaneurysm has been performed.  See CV proc for preliminary results.   Trino Higinbotham, RVT 04/30/2021, 8:40 AM

## 2021-05-02 ENCOUNTER — Encounter: Payer: Self-pay | Admitting: *Deleted

## 2021-05-06 ENCOUNTER — Other Ambulatory Visit: Payer: Self-pay

## 2021-05-06 ENCOUNTER — Ambulatory Visit (INDEPENDENT_AMBULATORY_CARE_PROVIDER_SITE_OTHER): Payer: Medicaid Other | Admitting: Podiatry

## 2021-05-06 DIAGNOSIS — L97512 Non-pressure chronic ulcer of other part of right foot with fat layer exposed: Secondary | ICD-10-CM

## 2021-05-06 DIAGNOSIS — E119 Type 2 diabetes mellitus without complications: Secondary | ICD-10-CM

## 2021-05-08 ENCOUNTER — Encounter: Payer: Self-pay | Admitting: Podiatry

## 2021-05-08 ENCOUNTER — Other Ambulatory Visit: Payer: Self-pay | Admitting: Interventional Radiology

## 2021-05-08 DIAGNOSIS — S91331S Puncture wound without foreign body, right foot, sequela: Secondary | ICD-10-CM

## 2021-05-08 NOTE — Progress Notes (Signed)
Subjective:  Patient ID: Perry Thomas, male    DOB: 1961/03/22,  MRN: [id number]  Chief Complaint  Patient presents with  . Foot Ulcer    Right foot  PT stated that he feels like the place on the foot has healed     60 y.o. male presents for wound care.  Patient presents for follow-up of right submetatarsal 4 ulceration with fat layer exposed.  Patient states is doing a lot better.  He was here for to get an angiogram done which was successful and got his arteries opened back up again.  He denies any other acute complaints.   Review of Systems: Negative except as noted in the HPI. Denies N/V/F/Ch.  Past Medical History:  Diagnosis Date  . Atypical chest pain 05/30/2019  . Diabetes mellitus without complication ([place])   . Elevated PSA, less than 10 ng/ml 03/31/2017  . ESRD (end stage renal disease) on dialysis ([place]) 01/30/2016  . ESRD on dialysis ([place])   . Hypertension   . Hypertension associated with diabetes ([place]) 01/30/2016  . Nuclear sclerotic cataract of right eye 08/25/2020  . PAD (peripheral artery disease) ([place])   . Posterior subcapsular age-related cataract, right eye 08/25/2020  . Type 2 diabetes mellitus ([place]) 01/30/2016    Current Outpatient Medications:  .  amLODipine (NORVASC) 10 MG tablet, Take 1 tablet (10 mg total) by mouth daily., Disp: 30 tablet, Rfl: 11 .  aspirin 81 MG EC tablet, Take 81 mg by mouth daily., Disp: , Rfl:  .  carvedilol (COREG) 12.5 MG tablet, Take 1 tablet (12.5 mg total) by mouth 2 (two) times daily with a meal., Disp: 60 tablet, Rfl: 11 .  clopidogrel (PLAVIX) 75 MG tablet, Take 1 tablet (75 mg total) by mouth daily., Disp: 90 tablet, Rfl: 3 .  Continuous Blood Gluc Sensor (DEXCOM G6 SENSOR) MISC, APPLY 1 SENSOR TO THE SKIN EVERY 10 DAYS FOR CONTINUOUS GLUCOSE MONITORING., Disp: 3 each, Rfl: 3 .  Continuous Blood Gluc Transmit (DEXCOM G6 TRANSMITTER) MISC, USE AS DIRECTED FOR CONTINUOUS GLUCOSE MONITORING. USE FOR 90 DAYS THEN DISCARD & REPLACE, Disp:  1 each, Rfl: 0 .  doxycycline (VIBRA-TABS) 100 MG tablet, Take 1 tablet (100 mg total) by mouth 2 (two) times daily., Disp: 20 tablet, Rfl: 0 .  doxycycline (VIBRA-TABS) 100 MG tablet, Take 1 tablet (100 mg total) by mouth 2 (two) times daily., Disp: 20 tablet, Rfl: 0 .  Dulaglutide (TRULICITY) A999333 0000000 SOPN, Inject 0.75 mg into the muscle every Sunday., Disp: 0.5 mL, Rfl: 6 .  furosemide (LASIX) 80 MG tablet, Take 40 mg by mouth daily as needed for fluid., Disp: , Rfl:  .  insulin glargine (LANTUS SOLOSTAR) 100 UNIT/ML Solostar Pen, Inject 12 Units into the skin at bedtime., Disp: , Rfl:  .  Lancets (ONETOUCH ULTRASOFT) lancets, Use to check blood sugar before breakfast and before supper. E11.65, Disp: , Rfl:  .  mycophenolate (MYFORTIC) 180 MG EC tablet, Take 360 mg by mouth 2 (two) times daily., Disp: , Rfl:  .  pantoprazole (PROTONIX) 40 MG tablet, TAKE 1 TABLET BY MOUTH EVERY DAY (Patient taking differently: Take 40 mg by mouth daily.), Disp: 30 tablet, Rfl: 1 .  predniSONE (DELTASONE) 5 MG tablet, Take 5 mg by mouth daily with breakfast., Disp: , Rfl:  .  SIMBRINZA 1-0.2 % SUSP, Place 1 drop into both eyes in the morning and at bedtime., Disp: , Rfl: 4 .  sulfamethoxazole-trimethoprim (BACTRIM) 400-80 MG tablet, Take 1 tablet by mouth  every Monday, Wednesday, and Friday., Disp: , Rfl:  .  tacrolimus (PROGRAF) 1 MG capsule, Take 3 mg by mouth 2 (two) times daily., Disp: , Rfl:   Social History   Tobacco Use  Smoking Status Former Smoker  . Quit date: 12/13/2009  . Years since quitting: 11.4  Smokeless Tobacco Never Used    No Known Allergies Objective:  There were no vitals filed for this visit. There is no height or weight on file to calculate BMI. Constitutional Well developed. Well nourished.  Vascular Dorsalis pedis pulses non palpable bilaterally. Posterior tibial pulses non palpable bilaterally. Capillary refill normal to all digits.  No cyanosis or clubbing  noted. Pedal hair growth normal.  Neurologic Normal speech. Oriented to person, place, and time. Protective sensation absent  Dermatologic  right submetatarsal 4 ulceration completely epithelialized.  No malodor present no infection present no recurrence of ulceration noted  Orthopedic: No pain to palpation either foot.   Radiographs: 3 views of skeletally mature the right foot: No osteomyelitis noted.  No soft tissue emphysema noted.  No fractures noted. Assessment:   No diagnosis found. Plan:  Patient was evaluated and treated and all questions answered.  Ulcer right submetatarsal 4 ulceration with fat layer exposed -Clinically healed after undergoing angiogram which may have improved enough blood for for him to completely heal.  At this time patient also has completely epithelialized and no longer has any further ulceration.  I have asked him to transition into regular shoes.  If any foot and ankle issues arise in the future come back and see me. No follow-ups on file.

## 2021-05-25 ENCOUNTER — Encounter (INDEPENDENT_AMBULATORY_CARE_PROVIDER_SITE_OTHER): Payer: Medicaid Other | Admitting: Ophthalmology

## 2021-05-27 ENCOUNTER — Ambulatory Visit
Admission: RE | Admit: 2021-05-27 | Discharge: 2021-05-27 | Disposition: A | Discharge status: Home / Self Care | Payer: Medicaid Other | Source: Ambulatory Visit | Attending: Interventional Radiology | Admitting: Interventional Radiology

## 2021-05-27 ENCOUNTER — Other Ambulatory Visit: Payer: Self-pay

## 2021-05-27 DIAGNOSIS — S91331S Puncture wound without foreign body, right foot, sequela: Secondary | ICD-10-CM

## 2021-05-27 HISTORY — PX: IR RADIOLOGIST EVAL & MGMT: IMG5224

## 2021-05-27 NOTE — Progress Notes (Signed)
Chief Complaint: Right foot wound   Referring Physician(s): Patel,Kevin P   History of Present Illness: Mattthew Kawecki is a 60 y.o. male presenting [facility] as a scheduled follow up, SP treatment of right lower extremity CLI for diabetic foot wound.   Mr Griesel had some confusion regarding the appointment whether virtual or live, and ended up calling us to have his follow up given the automated text that was sent to his phone.  We confirmed his identity with 2 personal identifiers.   Hx: He was referred initially by Dr. Posey Pronto of [facility], for evaluation of right lower extremity diabetic foot wound on the plantar aspect of the foot. This happened after some trauma when he stepped on a screw at home.   We treated him with right lower extremity angiogram and laser atherectomy/PTA of occluded right anterior tibial artery 04/22/21.   He was discharged same day with maximal medical therapy.  He was previously on DAPT with plavix and ASA, and simply continued that during/after our procedure.    Interval: He tells me that since our treatment he has had complete healing of the wound, and Dr. Posey Pronto has discharged him from wound care.  He continues to see [facility] for purposes of routine nail care of his feet.    He denies any problems in the mean time including at the access site.    Past Medical History:  Diagnosis Date   Atypical chest pain 05/30/2019   Diabetes mellitus without complication (HCC)    Elevated PSA, less than 10 ng/ml 03/31/2017   ESRD (end stage renal disease) on dialysis ([place]) 01/30/2016   ESRD on dialysis [facility])    Hypertension    Hypertension associated with diabetes ([place]) 01/30/2016   Nuclear sclerotic cataract of right eye 08/25/2020   PAD (peripheral artery disease) ([place])    Posterior subcapsular age-related cataract, right eye 08/25/2020   Type 2 diabetes mellitus ([place]) 01/30/2016    Past Surgical History:  Procedure Laterality  Date   IR ANGIOGRAM EXTREMITY RIGHT  04/22/2021   IR RADIOLOGIST EVAL & MGMT  03/26/2021   IR TIB-PERO ART ATHEREC INC PTA MOD SED  04/22/2021   IR US GUIDE VASC ACCESS RIGHT  04/22/2021   IR US GUIDE VASC ACCESS RIGHT  04/22/2021   PTCA     peripheral artery disease    Allergies: Patient has no known allergies.  Medications: Prior to Admission medications   Medication Sig Start Date End Date Taking? Authorizing Provider  amLODipine (NORVASC) 10 MG tablet Take 1 tablet (10 mg total) by mouth daily. 10/23/20   Jose Persia, MD  aspirin 81 MG EC tablet Take 81 mg by mouth daily. 09/05/19   [provider]  carvedilol (COREG) 12.5 MG tablet Take 1 tablet (12.5 mg total) by mouth 2 (two) times daily with a meal. 10/23/20   Jose Persia, MD  clopidogrel (PLAVIX) 75 MG tablet Take 1 tablet (75 mg total) by mouth daily. 10/23/20   Jose Persia, MD  Continuous Blood Gluc Sensor (DEXCOM G6 SENSOR) MISC APPLY 1 SENSOR TO THE SKIN EVERY 10 DAYS FOR CONTINUOUS GLUCOSE MONITORING. 04/23/21   Mosetta Anis, MD  Continuous Blood Gluc Transmit (DEXCOM G6 TRANSMITTER) MISC USE AS DIRECTED FOR CONTINUOUS GLUCOSE MONITORING. USE FOR 90 DAYS THEN DISCARD & REPLACE 04/23/21   Mosetta Anis, MD  doxycycline (VIBRA-TABS) 100 MG tablet Take 1 tablet (100 mg total) by mouth 2 (two) times  daily. 03/13/21   Felipa Furnace, DPM  doxycycline (VIBRA-TABS) 100 MG tablet Take 1 tablet (100 mg total) by mouth 2 (two) times daily. 03/13/21   Felipa Furnace, DPM  Dulaglutide (TRULICITY) A999333 0000000 SOPN Inject 0.75 mg into the muscle every Sunday. 04/26/21   Riesa Pope, MD  furosemide (LASIX) 80 MG tablet Take 40 mg by mouth daily as needed for fluid. 03/17/20   [provider]  insulin glargine (LANTUS SOLOSTAR) 100 UNIT/ML Solostar Pen Inject 12 Units into the skin at bedtime.    [provider]  Lancets [facility] ULTRASOFT) lancets Use to check blood sugar before breakfast and before  supper. E11.65 05/15/20   [provider]  mycophenolate (MYFORTIC) 180 MG EC tablet Take 360 mg by mouth 2 (two) times daily. 12/04/19   [provider]  pantoprazole (PROTONIX) 40 MG tablet TAKE 1 TABLET BY MOUTH EVERY DAY Patient taking differently: Take 40 mg by mouth daily. 03/04/21   Gaylan Gerold, DO  predniSONE (DELTASONE) 5 MG tablet Take 5 mg by mouth daily with breakfast. 04/03/20   [provider]  SIMBRINZA 1-0.2 % SUSP Place 1 drop into both eyes in the morning and at bedtime. 09/06/17   [provider]  sulfamethoxazole-trimethoprim (BACTRIM) 400-80 MG tablet Take 1 tablet by mouth every Monday, Wednesday, and Friday. 03/27/21   [provider]  tacrolimus (PROGRAF) 1 MG capsule Take 3 mg by mouth 2 (two) times daily. 05/01/20   [provider]     Family History  Problem Relation Age of Onset   Healthy Mother    Liver cancer Father    Kidney disease Sister     Social History   Socioeconomic History   Marital status: Single    Spouse name: Not on file   Number of children: 1   Years of education: 14   Highest education level: Associate degree: occupational, Hotel manager, or vocational program  Occupational History   Occupation: disability    Comment: retired Clinical biochemist  Tobacco Use   Smoking status: Former    Pack years: 0.00    Types: Cigarettes    Quit date: 12/13/2009    Years since quitting: 11.4   Smokeless tobacco: Never  Vaping Use   Vaping Use: Never used  Substance and Sexual Activity   Alcohol use: No    Alcohol/week: 0.0 standard drinks   Drug use: No   Sexual activity: Not on file  Other Topics Concern   Not on file  Social History Narrative   Lives with niece in an apartment on the first floor.  On disability for dialysis.     Social Determinants of Health   Financial Resource Strain: Not on file  Food Insecurity: Not on file  Transportation Needs: Not on file  Physical Activity: Not on file   Stress: Not on file  Social Connections: Not on file     Review of Systems  Review of Systems: A 12 point ROS discussed and pertinent positives are indicated in the HPI above.  All other systems are negative.  Physical Exam No direct physical exam was performed (except for noted visual exam findings with Video Visits).    Vital Signs: There were no vitals taken for this visit.  Imaging: VAS [place] GROIN PSEUDOANEURYSM  Result Date: 04/30/2021  ARTERIAL PSEUDOANEURYSM  Patient Name:  DAMARCO OVERCASH  Date of Exam:   04/30/2021 Medical Rec #: [id number]     Accession #:    [id number] Date of  Birth: [birth year]      Patient Gender: M Patient Age:   60Y Exam Location:  [facility] Procedure:      VAS [place] Gloriajean Dell Referring Phys: [id number] Corrie Mckusick --------------------------------------------------------------------------------  Exam: Right groin Indications: Patient complains of "bulge" in groin post op. History: S/P Right lower extremity angio and treatment of occluded AT with laser atherectomy and PTA. Comparison Study: No prior study on file Performing Technologist: Sharion Dove RVS  Examination Guidelines: A complete evaluation includes B-mode imaging, spectral Doppler, color Doppler, and power Doppler as needed of all accessible portions of each vessel. Bilateral testing is considered an integral part of a complete examination. Limited examinations for reoccurring indications may be performed as noted.  Summary: No evidence of pseudoaneurysm, AVF or DVT  Diagnosing physician: Servando Snare MD Electronically signed by Servando Snare MD on 04/30/2021 at 3:36:44 PM.   --------------------------------------------------------------------------------    Final     Labs:  CBC: Recent Labs    04/22/21 0742  WBC 4.7  HGB 13.5  HCT 43.1  PLT 230    COAGS: Recent Labs    04/22/21 0742  INR 1.1    BMP: Recent Labs    04/22/21 0742  NA 139  K 4.6  CL 108  CO2 26  GLUCOSE  118*  BUN 16  CALCIUM 9.3  CREATININE 0.86  GFRNONAA >60    LIVER FUNCTION TESTS: No results for input(s): BILITOT, AST, ALT, ALKPHOS, PROT, ALBUMIN in the last 8760 hours.  TUMOR MARKERS: No results for input(s): AFPTM, CEA, CA199, CHROMGRNA in the last 8760 hours.  Assessment and Plan: Assessment:  Mr Driesenga is a 60yo male SP treatment of RLE foot wound with anterior tibial artery restoration of flow with laser atherectomy and PTA.   After our procedure, he has healed completely.    Maximal medical therapy for reduction of risk factors is indicated as recommended by updated AHA guidelines1.  This includes anti-platelet medication, tight blood glucose control to a HbA1c < 7, tight blood pressure control, maximum-dose HMG-CoA reductase inhibitor, and smoking cessation.   Plan: - We will not plan on a follow up at this time, given he has healed completely.  We are happy to see him back if he should have any recurrent problems.   - Continue maximal medical therapy for cardiovascular risk reduction, including anti-platelet therapy, as well as continuing foot care as is.    Electronically Signed: Corrie Mckusick 05/27/2021, 3:34 PM   I spent a total of    25 Minutes in remote  clinical consultation, greater than 50% of which was counseling/coordinating care for right foot wound, SP laser atherectomy/PTA.    Visit type: Audio only (telephone). Audio (no video) only due to patient's lack of internet/smartphone capability. Alternative for in-person consultation at [facility], [address] [place], [place], [place]. This visit type was conducted due to national recommendations for restrictions regarding the COVID-19 Pandemic (e.g. social distancing).  This format is felt to be most appropriate for this patient at this time.  All issues noted in this document were discussed and addressed.

## 2021-05-28 ENCOUNTER — Encounter: Payer: Self-pay | Admitting: *Deleted

## 2021-07-29 ENCOUNTER — Other Ambulatory Visit: Payer: Self-pay

## 2021-07-29 ENCOUNTER — Ambulatory Visit: Payer: Medicaid Other | Admitting: Podiatry

## 2021-07-29 ENCOUNTER — Encounter: Payer: Self-pay | Admitting: Podiatry

## 2021-07-29 DIAGNOSIS — E119 Type 2 diabetes mellitus without complications: Secondary | ICD-10-CM

## 2021-07-29 DIAGNOSIS — M79676 Pain in unspecified toe(s): Secondary | ICD-10-CM

## 2021-07-29 DIAGNOSIS — N186 End stage renal disease: Secondary | ICD-10-CM | POA: Diagnosis not present

## 2021-07-29 DIAGNOSIS — D689 Coagulation defect, unspecified: Secondary | ICD-10-CM | POA: Diagnosis not present

## 2021-07-29 DIAGNOSIS — B351 Tinea unguium: Secondary | ICD-10-CM

## 2021-07-29 DIAGNOSIS — I739 Peripheral vascular disease, unspecified: Secondary | ICD-10-CM

## 2021-07-29 NOTE — Progress Notes (Signed)
Patient ID: Perry Thomas, male   DOB: 1961-01-30, 60 y.o.   MRN: [id number] Complaint:  Visit Type: Patient returns to my office for continued preventative foot care services. Complaint: Patient states" my nails have grown long and thick and become painful to walk and wear shoes" Patient has been diagnosed with DM with no foot complications.  Patient has history of kidney transplant.. The patient presents for preventative foot care services. No changes to ROS.  Patient is taking plavix which causes coagulation defect.    Podiatric Exam: Vascular: dorsalis pedis and posterior tibial pulses are weakly palpable bilateral. Capillary return is immediate. Cold feet. Skin turgor WNL Absent digital hair  B/L. Sensorium: Normal Semmes Weinstein monofilament test. Normal tactile sensation bilaterally. Nail Exam: Pt has thick disfigured discolored nails with subungual debris noted bilateral entire nail hallux through fifth toenails Ulcer Exam: There is no evidence of ulcer or pre-ulcerative changes or infection. Orthopedic Exam: Muscle tone and strength are WNL. No limitations in general ROM. No crepitus or effusions noted. Foot type and digits show no abnormalities. Bony prominences are unremarkable. Skin:  Porokeratosis sub 5 left.. No infection or ulcers.  Healing skin ulcer right forefoot.  Diagnosis:  Onychomycosis, , Pain in right toe, pain in left toes    Treatment & Plan Procedures and Treatment: Consent by patient was obtained for treatment procedures. The patient understood the discussion of treatment and procedures well. All questions were answered thoroughly reviewed. Debridement of mycotic and hypertrophic toenails, 1 through 5 bilateral and clearing of subungual debris. No ulceration, no infection noted.  Return Visit-Office Procedure: Patient instructed to return to the office for a follow up visit 10 weeks  for continued evaluation and treatment.    Gardiner Barefoot DPM

## 2021-07-30 ENCOUNTER — Encounter (INDEPENDENT_AMBULATORY_CARE_PROVIDER_SITE_OTHER): Payer: Medicaid Other | Admitting: Ophthalmology

## 2021-08-12 ENCOUNTER — Ambulatory Visit (INDEPENDENT_AMBULATORY_CARE_PROVIDER_SITE_OTHER): Payer: Medicaid Other | Admitting: Ophthalmology

## 2021-08-12 ENCOUNTER — Encounter (INDEPENDENT_AMBULATORY_CARE_PROVIDER_SITE_OTHER): Payer: Self-pay | Admitting: Ophthalmology

## 2021-08-12 ENCOUNTER — Other Ambulatory Visit: Payer: Self-pay

## 2021-08-12 DIAGNOSIS — E113511 Type 2 diabetes mellitus with proliferative diabetic retinopathy with macular edema, right eye: Secondary | ICD-10-CM | POA: Diagnosis not present

## 2021-08-12 DIAGNOSIS — E113512 Type 2 diabetes mellitus with proliferative diabetic retinopathy with macular edema, left eye: Secondary | ICD-10-CM

## 2021-08-12 NOTE — Assessment & Plan Note (Signed)
OD CSME resolved post focal laser some 9 months previous  Will need completion of PRP superiorly to induce quiescent's of PDR completely nonurgent

## 2021-08-12 NOTE — Assessment & Plan Note (Signed)
Focal CSME has recurred nasal to the FAZ and will need antivegF to quiet this area.  We will schedule next 2 to 3 weeks

## 2021-08-12 NOTE — Progress Notes (Signed)
08/12/2021     CHIEF COMPLAINT Patient presents for  Chief Complaint  Patient presents with   Retina Follow Up    8 wk fu OU/ Possible Avastin OS  Pt states VA OU stable since last visit. Pt denies FOL, floaters, or ocular pain OU.    A1C:7.0 LBS: 180      HISTORY OF PRESENT ILLNESS: Perry Thomas is a 60 y.o. male who presents to the clinic today for:   HPI     Retina Follow Up   Patient presents with  Diabetic Retinopathy.  In left eye.  This started 4 months ago.  Severity is mild.  Duration of 4 months.  Since onset it is stable. Additional comments: 8 wk fu OU/ Possible Avastin OS  Pt states VA OU stable since last visit. Pt denies FOL, floaters, or ocular pain OU.    A1C:7.0 LBS: 180        Comments   4 mos fu ou oct fp Patient states vision is stable and unchanged since last visit. Denies any new floaters or FOL. LBS: "133 this morning before coffee." A1C: unknown per patient, will have it done on 9/13. Pt states he is using Simbrinza twice a day in both eyes.       Last edited by Laurin Coder, COA on 08/12/2021  8:47 AM.      Referring physician: Curt Jews, PA-C [id number] [place],  [place] [address]  HISTORICAL INFORMATION:   Selected notes from the MEDICAL RECORD NUMBER    Lab Results  Component Value Date   HGBA1C 6.2 (A) 10/23/2020     CURRENT MEDICATIONS: Current Outpatient Medications (Ophthalmic Drugs)  Medication Sig   SIMBRINZA 1-0.2 % SUSP Place 1 drop into both eyes in the morning and at bedtime.   No current facility-administered medications for this visit. (Ophthalmic Drugs)   Current Outpatient Medications (Other)  Medication Sig   amLODipine (NORVASC) 10 MG tablet Take 1 tablet (10 mg total) by mouth daily.   aspirin 81 MG EC tablet Take 81 mg by mouth daily.   carvedilol (COREG) 12.5 MG tablet Take 1 tablet (12.5 mg total) by mouth 2 (two) times daily with a meal.   clopidogrel (PLAVIX) 75 MG  tablet Take 1 tablet (75 mg total) by mouth daily.   Continuous Blood Gluc Sensor (DEXCOM G6 SENSOR) MISC APPLY 1 SENSOR TO THE SKIN EVERY 10 DAYS FOR CONTINUOUS GLUCOSE MONITORING.   Continuous Blood Gluc Transmit (DEXCOM G6 TRANSMITTER) MISC USE AS DIRECTED FOR CONTINUOUS GLUCOSE MONITORING. USE FOR 90 DAYS THEN DISCARD & REPLACE   doxycycline (VIBRA-TABS) 100 MG tablet Take 1 tablet (100 mg total) by mouth 2 (two) times daily.   doxycycline (VIBRA-TABS) 100 MG tablet Take 1 tablet (100 mg total) by mouth 2 (two) times daily.   Dulaglutide (TRULICITY) A999333 0000000 SOPN Inject 0.75 mg into the muscle every Sunday.   furosemide (LASIX) 80 MG tablet Take 40 mg by mouth daily as needed for fluid.   insulin glargine (LANTUS SOLOSTAR) 100 UNIT/ML Solostar Pen Inject 12 Units into the skin at bedtime.   Lancets (ONETOUCH ULTRASOFT) lancets Use to check blood sugar before breakfast and before supper. E11.65   mycophenolate (MYFORTIC) 180 MG EC tablet Take 360 mg by mouth 2 (two) times daily.   pantoprazole (PROTONIX) 40 MG tablet TAKE 1 TABLET BY MOUTH EVERY DAY (Patient taking differently: Take 40 mg by mouth daily.)   predniSONE (DELTASONE) 5 MG  tablet Take 5 mg by mouth daily with breakfast.   sulfamethoxazole-trimethoprim (BACTRIM) 400-80 MG tablet Take 1 tablet by mouth every Monday, Wednesday, and Friday.   tacrolimus (PROGRAF) 1 MG capsule Take 3 mg by mouth 2 (two) times daily.   No current facility-administered medications for this visit. (Other)      REVIEW OF SYSTEMS:    ALLERGIES No Known Allergies  PAST MEDICAL HISTORY Past Medical History:  Diagnosis Date   Atypical chest pain 05/30/2019   Diabetes mellitus without complication (HCC)    Elevated PSA, less than 10 ng/ml 03/31/2017   ESRD (end stage renal disease) on dialysis ([place]) 01/30/2016   ESRD on dialysis [facility])    Hypertension    Hypertension associated with diabetes ([place]) 01/30/2016   Nuclear sclerotic cataract of right  eye 08/25/2020   PAD (peripheral artery disease) ([place])    Posterior subcapsular age-related cataract, right eye 08/25/2020   Type 2 diabetes mellitus ([place]) 01/30/2016   Past Surgical History:  Procedure Laterality Date   IR ANGIOGRAM EXTREMITY RIGHT  04/22/2021   IR RADIOLOGIST EVAL & MGMT  03/26/2021   IR RADIOLOGIST EVAL & MGMT  05/27/2021   IR TIB-PERO ART ATHEREC INC PTA MOD SED  04/22/2021   IR US GUIDE VASC ACCESS RIGHT  04/22/2021   IR US GUIDE VASC ACCESS RIGHT  04/22/2021   PTCA     peripheral artery disease    FAMILY HISTORY Family History  Problem Relation Age of Onset   Healthy Mother    Liver cancer Father    Kidney disease Sister     SOCIAL HISTORY Social History   Tobacco Use   Smoking status: Former    Types: Cigarettes    Quit date: 12/13/2009    Years since quitting: 11.6   Smokeless tobacco: Never  Vaping Use   Vaping Use: Never used  Substance Use Topics   Alcohol use: No    Alcohol/week: 0.0 standard drinks   Drug use: No         OPHTHALMIC EXAM:  Base Eye Exam     Visual Acuity (ETDRS)       Right Left   Dist [place] 20/20 -2 20/40   Dist ph Micro  NI         Tonometry (Tonopen, 8:52 AM)       Right Left   Pressure 20 17         Pupils       Dark Light Shape React APD   Right 5 4 Irregular Sluggish None   Left 5 4 Round Sluggish None         Extraocular Movement       Right Left    Full Full         Neuro/Psych     Oriented x3: Yes   Mood/Affect: Normal         Dilation     Both eyes: 1.0% Mydriacyl, 2.5% Phenylephrine @ 8:52 AM           Slit Lamp and Fundus Exam     External Exam       Right Left   External Normal Normal         Slit Lamp Exam       Right Left   Lids/Lashes Normal Normal   Conjunctiva/Sclera White and quiet White and quiet   Cornea Clear Clear   Anterior Chamber Deep and quiet Deep and quiet   Iris Round and reactive Round and  reactive   Lens Well centered multifocal IOL  Posterior chamber intraocular lens, Centered posterior chamber intraocular lens   Anterior Vitreous Normal Small remnant of oil in the anterior hyaloid         Fundus Exam       Right Left   Posterior Vitreous Clear posteriorly Clear posteriorly   Disc 1+ Optic disc atrophy, 1+ Pallor 1+ Optic disc atrophy, 1+ Pallor   C/D Ratio 0.85 0.85   Macula Microaneurysms, no macular thickening Microaneurysms, no macular thickening   Vessels PDR-quiet PDR-quiet   Periphery Good PRP peripherally, room for PRP superiorly OD with small area of NVE Good PRP peripherally, no room for PRP            IMAGING AND PROCEDURES  Imaging and Procedures for 08/12/21  OCT, Retina - OU - Both Eyes       Right Eye Quality was good. Scan locations included subfoveal. Central Foveal Thickness: 207. Progression has improved. Findings include abnormal foveal contour, no SRF, no IRF.   Left Eye Quality was good. Scan locations included subfoveal. Central Foveal Thickness: 298. Progression has worsened. Findings include abnormal foveal contour.   Notes Much improved CSME OD, will observe   CSME OS, increase now nasal to FAZ, new over the last 4 months we will need Intravitreal Avastin the near future intravitreal     Color Fundus Photography Optos - OU - Both Eyes       Right Eye Progression has been stable. Disc findings include normal observations. Macula : microaneurysms.   Left Eye Progression has been stable. Macula : microaneurysms, edema.   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  We will schedule next 2 to 3 weeks     ICD-10-CM   1. Diabetic macular edema of left eye with proliferative retinopathy associated with type 2 diabetes mellitus (HCC)  [id number] OCT, Retina - OU - Both Eyes    Color Fundus Photography Optos - OU - Both Eyes    2. Diabetic macular edema of right eye with proliferative retinopathy associated with type 2 diabetes mellitus ([place])  E11.3511       1.  OU overall vastly improved yet now with recurrence of CSME OS, will schedule in the near future for angiographic evaluation as well as injection Avastin OS  2.  OD will thereafter need completion of PRP the superior quadrant  3.  Ophthalmic Meds Ordered this visit:  No orders of the defined types were placed in this encounter.      Return in about 2 weeks (around 08/26/2021) for DILATE OU, COLOR FP, OPTOS FFA L/R, AVASTIN OCT, OS.  There are no Patient Instructions on file for this visit.   Explained the diagnoses, plan, and follow up with the patient and they expressed understanding.  Patient expressed understanding of the importance of proper follow up care.   Clent Demark Caydyn Sprung M.D. Diseases & Surgery of the Retina and Vitreous [facility] 08/12/21     Abbreviations: M myopia (nearsighted); A astigmatism; H hyperopia (farsighted); P presbyopia; Mrx spectacle prescription;  CTL contact lenses; OD right eye; OS left eye; OU both eyes  XT exotropia; ET esotropia; PEK punctate epithelial keratitis; PEE punctate epithelial erosions; DES dry eye syndrome; MGD meibomian gland dysfunction; ATs artificial tears; PFAT's preservative free artificial tears; [place] nuclear sclerotic cataract; PSC posterior subcapsular cataract; ERM epi-retinal membrane; PVD posterior vitreous detachment; RD retinal detachment; DM diabetes mellitus; DR diabetic retinopathy; NPDR non-proliferative  diabetic retinopathy; PDR proliferative diabetic retinopathy; CSME clinically significant macular edema; DME diabetic macular edema; dbh dot blot hemorrhages; CWS cotton wool spot; POAG primary open angle glaucoma; C/D cup-to-disc ratio; HVF humphrey visual field; GVF goldmann visual field; OCT optical coherence tomography; IOP intraocular pressure; BRVO Branch retinal vein occlusion; CRVO central retinal vein occlusion; CRAO central retinal artery occlusion; BRAO branch retinal artery occlusion; RT retinal tear; SB scleral buckle; PPV pars plana vitrectomy; VH Vitreous hemorrhage; PRP panretinal laser photocoagulation; IVK intravitreal kenalog; VMT vitreomacular traction; MH Macular hole;  NVD neovascularization of the disc; NVE neovascularization elsewhere; AREDS age related eye disease study; ARMD age related macular degeneration; POAG primary open angle glaucoma; EBMD epithelial/anterior basement membrane dystrophy; ACIOL anterior chamber intraocular lens; IOL intraocular lens; PCIOL posterior chamber intraocular lens; Phaco/IOL phacoemulsification with intraocular lens placement; [place] photorefractive keratectomy; LASIK laser assisted in situ keratomileusis; HTN hypertension; DM diabetes mellitus; COPD chronic obstructive pulmonary disease

## 2021-08-20 ENCOUNTER — Other Ambulatory Visit: Payer: Self-pay | Admitting: Internal Medicine

## 2021-08-20 DIAGNOSIS — Z940 Kidney transplant status: Secondary | ICD-10-CM

## 2021-08-25 ENCOUNTER — Encounter (INDEPENDENT_AMBULATORY_CARE_PROVIDER_SITE_OTHER): Payer: Medicaid Other | Admitting: Ophthalmology

## 2021-08-27 ENCOUNTER — Encounter (INDEPENDENT_AMBULATORY_CARE_PROVIDER_SITE_OTHER): Payer: Medicaid Other | Admitting: Ophthalmology

## 2021-08-31 ENCOUNTER — Ambulatory Visit (INDEPENDENT_AMBULATORY_CARE_PROVIDER_SITE_OTHER): Payer: Medicaid Other | Admitting: Ophthalmology

## 2021-08-31 ENCOUNTER — Other Ambulatory Visit: Payer: Self-pay

## 2021-08-31 ENCOUNTER — Encounter (INDEPENDENT_AMBULATORY_CARE_PROVIDER_SITE_OTHER): Payer: Self-pay | Admitting: Ophthalmology

## 2021-08-31 DIAGNOSIS — E113511 Type 2 diabetes mellitus with proliferative diabetic retinopathy with macular edema, right eye: Secondary | ICD-10-CM | POA: Diagnosis not present

## 2021-08-31 DIAGNOSIS — E113512 Type 2 diabetes mellitus with proliferative diabetic retinopathy with macular edema, left eye: Secondary | ICD-10-CM | POA: Diagnosis not present

## 2021-08-31 MED ORDER — BEVACIZUMAB 2.5 MG/0.1ML IZ SOSY
2.5000 mg | PREFILLED_SYRINGE | INTRAVITREAL | Status: AC | PRN
  Administered 2021-08-31: 2.5 mg via INTRAVITREAL

## 2021-08-31 MED ORDER — FLUORESCEIN SODIUM 10 % IV SOLN
500.0000 mg | INTRAVENOUS | Status: AC | PRN
  Administered 2021-08-31: 500 mg via INTRAVENOUS

## 2021-08-31 NOTE — Assessment & Plan Note (Signed)
Quiet PDR OD, no active CSME will observe

## 2021-08-31 NOTE — Progress Notes (Signed)
08/31/2021     CHIEF COMPLAINT Patient presents for  Chief Complaint  Patient presents with   Retina Follow Up      HISTORY OF PRESENT ILLNESS: Perry Thomas is a 60 y.o. male who presents to the clinic today for:   HPI     Retina Follow Up   Patient presents with  Diabetic Retinopathy.  In both eyes.  This started 2 weeks ago.  Severity is mild.  Duration of 2 weeks.  Since onset it is stable.        Comments   2 week fp/ffa l/r oct avastin OS Pt states VA OU stable since last visit. Pt denies FOL, floaters, or ocular pain OU.  LBS: 119 Pt reports using Simbrinza BID OU       Last edited by Kendra Opitz, COA on 08/31/2021  8:10 AM.      Referring physician: Curt Jews, PA-C [id number] [place],  [place] [address]  HISTORICAL INFORMATION:   Selected notes from the MEDICAL RECORD NUMBER    Lab Results  Component Value Date   HGBA1C 6.2 (A) 10/23/2020     CURRENT MEDICATIONS: Current Outpatient Medications (Ophthalmic Drugs)  Medication Sig   SIMBRINZA 1-0.2 % SUSP Place 1 drop into both eyes in the morning and at bedtime.   No current facility-administered medications for this visit. (Ophthalmic Drugs)   Current Outpatient Medications (Other)  Medication Sig   amLODipine (NORVASC) 10 MG tablet Take 1 tablet (10 mg total) by mouth daily.   aspirin 81 MG EC tablet Take 81 mg by mouth daily.   carvedilol (COREG) 12.5 MG tablet Take 1 tablet (12.5 mg total) by mouth 2 (two) times daily with a meal.   clopidogrel (PLAVIX) 75 MG tablet Take 1 tablet (75 mg total) by mouth daily.   Continuous Blood Gluc Sensor (DEXCOM G6 SENSOR) MISC APPLY 1 SENSOR TO THE SKIN EVERY 10 DAYS FOR CONTINUOUS GLUCOSE MONITORING.   Continuous Blood Gluc Transmit (DEXCOM G6 TRANSMITTER) MISC USE AS DIRECTED FOR CONTINUOUS GLUCOSE MONITORING. USE FOR 90 DAYS THEN DISCARD & REPLACE   doxycycline (VIBRA-TABS) 100 MG tablet Take 1 tablet (100 mg total) by mouth 2  (two) times daily.   doxycycline (VIBRA-TABS) 100 MG tablet Take 1 tablet (100 mg total) by mouth 2 (two) times daily.   Dulaglutide (TRULICITY) [id number] [id number] SOPN Inject 0.75 mg into the muscle every Sunday.   furosemide (LASIX) 80 MG tablet Take 40 mg by mouth daily as needed for fluid.   insulin glargine (LANTUS SOLOSTAR) 100 UNIT/ML Solostar Pen Inject 12 Units into the skin at bedtime.   Lancets (ONETOUCH ULTRASOFT) lancets Use to check blood sugar before breakfast and before supper. E11.65   mycophenolate (MYFORTIC) 180 MG EC tablet Take 360 mg by mouth 2 (two) times daily.   pantoprazole (PROTONIX) 40 MG tablet TAKE 1 TABLET BY MOUTH EVERY DAY (Patient taking differently: Take 40 mg by mouth daily.)   predniSONE (DELTASONE) 5 MG tablet Take 5 mg by mouth daily with breakfast.   sulfamethoxazole-trimethoprim (BACTRIM) 400-80 MG tablet Take 1 tablet by mouth every Monday, Wednesday, and Friday.   tacrolimus (PROGRAF) 1 MG capsule Take 3 mg by mouth 2 (two) times daily.   No current facility-administered medications for this visit. (Other)      REVIEW OF SYSTEMS:    ALLERGIES No Known Allergies  PAST MEDICAL HISTORY Past Medical History:  Diagnosis Date   Atypical chest pain 05/30/2019  Diabetes mellitus without complication (HCC)    Elevated PSA, less than 10 ng/ml 03/31/2017   ESRD (end stage renal disease) on dialysis ([place]) 01/30/2016   ESRD on dialysis [facility])    Hypertension    Hypertension associated with diabetes ([place]) 01/30/2016   Nuclear sclerotic cataract of right eye 08/25/2020   PAD (peripheral artery disease) ([place])    Posterior subcapsular age-related cataract, right eye 08/25/2020   Type 2 diabetes mellitus ([place]) 01/30/2016   Past Surgical History:  Procedure Laterality Date   IR ANGIOGRAM EXTREMITY RIGHT  04/22/2021   IR RADIOLOGIST EVAL & MGMT  03/26/2021   IR RADIOLOGIST EVAL & MGMT  05/27/2021   IR TIB-PERO ART ATHEREC INC PTA MOD SED  04/22/2021   IR US GUIDE  VASC ACCESS RIGHT  04/22/2021   IR US GUIDE VASC ACCESS RIGHT  04/22/2021   PTCA     peripheral artery disease    FAMILY HISTORY Family History  Problem Relation Age of Onset   Healthy Mother    Liver cancer Father    Kidney disease Sister     SOCIAL HISTORY Social History   Tobacco Use   Smoking status: Former    Types: Cigarettes    Quit date: 12/13/2009    Years since quitting: 11.7   Smokeless tobacco: Never  Vaping Use   Vaping Use: Never used  Substance Use Topics   Alcohol use: No    Alcohol/week: 0.0 standard drinks   Drug use: No         OPHTHALMIC EXAM:  Base Eye Exam     Visual Acuity (ETDRS)       Right Left   Dist cc 20/25 -1 20/60   Dist ph cc  20/40 -1         Tonometry (Tonopen, 8:14 AM)       Right Left   Pressure 12 10         Pupils       Pupils Dark Light Shape React APD   Right PERRL 5 4 Round Sluggish None   Left PERRL 5 4 Round Sluggish None         Visual Fields (Counting fingers)       Left Right    Full Full         Extraocular Movement       Right Left    Full Full         Neuro/Psych     Oriented x3: Yes   Mood/Affect: Normal         Dilation     Both eyes: 1.0% Mydriacyl, 2.5% Phenylephrine @ 8:14 AM           Slit Lamp and Fundus Exam     External Exam       Right Left   External Normal Normal         Slit Lamp Exam       Right Left   Lids/Lashes Normal Normal   Conjunctiva/Sclera White and quiet White and quiet   Cornea Clear Clear   Anterior Chamber Deep and quiet Deep and quiet   Iris Round and reactive Round and reactive   Lens Well centered multifocal IOL Posterior chamber intraocular lens, Centered posterior chamber intraocular lens   Anterior Vitreous Normal Small remnant of oil in the anterior hyaloid         Fundus Exam       Right Left   Posterior Vitreous Clear posteriorly Clear posteriorly  Disc 1+ Optic disc atrophy, 1+ Pallor 1+ Optic disc atrophy, 1+  Pallor   C/D Ratio 0.85 0.85   Macula Microaneurysms, no macular thickening Microaneurysms, Macular thickening, Mild clinically significant macular edema   Vessels PDR-quiet PDR-quiet   Periphery Good PRP peripherally, room for PRP superiorly OD with small area of NVE Good PRP peripherally, no room for PRP            IMAGING AND PROCEDURES  Imaging and Procedures for 08/31/21  OCT, Retina - OU - Both Eyes       Right Eye Quality was good. Scan locations included subfoveal. Central Foveal Thickness: 207. Progression has been stable. Findings include abnormal foveal contour, no SRF, no IRF.   Left Eye Quality was good. Scan locations included subfoveal. Central Foveal Thickness: 262. Progression has improved. Findings include abnormal foveal contour.   Notes Much improved CSME OD, will observe   CSME OS, increase now nasal to FAZ, much less at 3 weeks post most recent evaluation yet still active nasal to FAZ OD as     Fluorescein Angiography Optos (Transit OS)       Injection: 500 mg Fluorescein Sodium 10 %   Route: Intravenous, Site: Right Arm   NDC: [phone]   Right Eye Mid/Late phase findings include microaneurysm. Choroidal neovascularization is not present.   Left Eye   Early phase findings include leakage, microaneurysm. Mid/Late phase findings include leakage, microaneurysm. Choroidal neovascularization is not present.   Notes CSME OS nasal to FAZ confirmed as well as by OCT.  We will commence with repeat injection Avastin today     Color Fundus Photography Optos - OU - Both Eyes       Right Eye Progression has been stable. Disc findings include normal observations. Macula : microaneurysms.   Left Eye Progression has been stable. Macula : microaneurysms, edema.   Notes Quiet PDR OU yet room for PRP superiorly OD       Intravitreal Injection, Pharmacologic Agent - OS - Left Eye       Time Out 08/31/2021. 8:55 AM. Confirmed correct  patient, procedure, site, and patient consented.   Anesthesia Topical anesthesia was used. Anesthetic medications included [place] 3.5%.   Procedure Preparation included Ofloxacin , Tobramycin 0.3%, 10% betadine to eyelids, 5% betadine to ocular surface. A supplied needle was used.   Injection: 2.5 mg bevacizumab 2.5 MG/0.1ML   Route: Intravitreal, Site: Left Eye   NDC: [phone], Lot: [id number]   Post-op Post injection exam found visual acuity of at least counting fingers. The patient tolerated the procedure well. There were no complications. The patient received written and verbal post procedure care education. Post injection medications included ocuflox.              ASSESSMENT/PLAN:  Diabetic macular edema of left eye with proliferative retinopathy associated with type 2 diabetes mellitus (HCC) OS, with recurrent CSME nasal to FAZ.  Improved from 3 weeks previous but nonetheless we will repeat injection today and maintain improved CSME state.  PDR remains quiet  Diabetic macular edema of right eye with proliferative retinopathy associated with type 2 diabetes mellitus (HCC) Quiet PDR OD, no active CSME will observe      ICD-10-CM   1. Diabetic macular edema of left eye with proliferative retinopathy associated with type 2 diabetes mellitus (HCC)  [id number] OCT, Retina - OU - Both Eyes    Fluorescein Angiography Optos (Transit OS)    Color Fundus Photography Optos - OU - Both Eyes  Intravitreal Injection, Pharmacologic Agent - OS - Left Eye    Intravitreal Injection, Pharmacologic Agent - OS - Left Eye    bevacizumab (AVASTIN) SOSY 2.5 mg    Fluorescein Sodium 10 % injection 500 mg    2. Diabetic macular edema of right eye with proliferative retinopathy associated with type 2 diabetes mellitus (HCC)  [id number] Fluorescein Angiography Optos (Transit OS)    Color Fundus Photography Optos - OU - Both Eyes    Fluorescein Sodium 10 % injection 500 mg      1.  OS with  active CSME confirmed by fluorescein angiography today.  We will repeat injection today.  OU overall improved post focal laser treatment within the last 10 months  2.  PDR OU quiet.  3.  Ophthalmic Meds Ordered this visit:  Meds ordered this encounter  Medications   bevacizumab (AVASTIN) SOSY 2.5 mg   Fluorescein Sodium 10 % injection 500 mg       Return in about 9 weeks (around 11/02/2021) for DILATE OU, OCT, AVASTIN OCT, OS,, possible OS.  There are no Patient Instructions on file for this visit.   Explained the diagnoses, plan, and follow up with the patient and they expressed understanding.  Patient expressed understanding of the importance of proper follow up care.   Clent Demark Rosiland Sen M.D. Diseases & Surgery of the Retina and Vitreous [facility] [place] 08/31/21     Abbreviations: M myopia (nearsighted); A astigmatism; H hyperopia (farsighted); P presbyopia; Mrx spectacle prescription;  CTL contact lenses; OD right eye; OS left eye; OU both eyes  XT exotropia; ET esotropia; PEK punctate epithelial keratitis; PEE punctate epithelial erosions; DES dry eye syndrome; MGD meibomian gland dysfunction; ATs artificial tears; PFAT's preservative free artificial tears; [place] nuclear sclerotic cataract; PSC posterior subcapsular cataract; ERM epi-retinal membrane; PVD posterior vitreous detachment; RD retinal detachment; DM diabetes mellitus; DR diabetic retinopathy; NPDR non-proliferative diabetic retinopathy; PDR proliferative diabetic retinopathy; CSME clinically significant macular edema; DME diabetic macular edema; dbh dot blot hemorrhages; CWS cotton wool spot; POAG primary open angle glaucoma; C/D cup-to-disc ratio; HVF humphrey visual field; GVF goldmann visual field; OCT optical coherence tomography; IOP intraocular pressure; BRVO Branch retinal vein occlusion; CRVO central retinal vein occlusion; CRAO central retinal artery occlusion; BRAO branch retinal artery occlusion; RT  retinal tear; SB scleral buckle; PPV pars plana vitrectomy; VH Vitreous hemorrhage; PRP panretinal laser photocoagulation; IVK intravitreal kenalog; VMT vitreomacular traction; MH Macular hole;  NVD neovascularization of the disc; NVE neovascularization elsewhere; AREDS age related eye disease study; ARMD age related macular degeneration; POAG primary open angle glaucoma; EBMD epithelial/anterior basement membrane dystrophy; ACIOL anterior chamber intraocular lens; IOL intraocular lens; PCIOL posterior chamber intraocular lens; Phaco/IOL phacoemulsification with intraocular lens placement; [place] photorefractive keratectomy; LASIK laser assisted in situ keratomileusis; HTN hypertension; DM diabetes mellitus; COPD chronic obstructive pulmonary disease

## 2021-08-31 NOTE — Assessment & Plan Note (Signed)
OS, with recurrent CSME nasal to FAZ.  Improved from 3 weeks previous but nonetheless we will repeat injection today and maintain improved CSME state.  PDR remains quiet

## 2021-09-01 ENCOUNTER — Encounter (INDEPENDENT_AMBULATORY_CARE_PROVIDER_SITE_OTHER): Payer: Medicaid Other | Admitting: Ophthalmology

## 2021-09-12 IMAGING — US IR ANGIO/EXT/UNI*R*
1 series · 6 of 6 positions shown · non-contrast
Comparison: none

INDICATION: 60-year-old male with a history of right-sided diabetic foot ulcer,
presents for angiogram and possible intervention.

[Series 1: ir angio/ext/uni*right* · 6 of 6 slices shown]
[im 1/6]
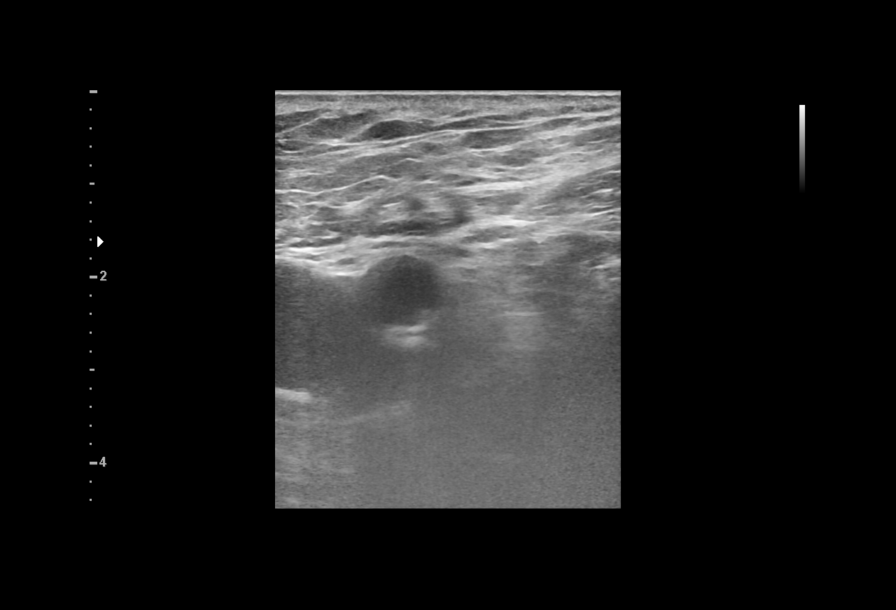
[im 2/6]
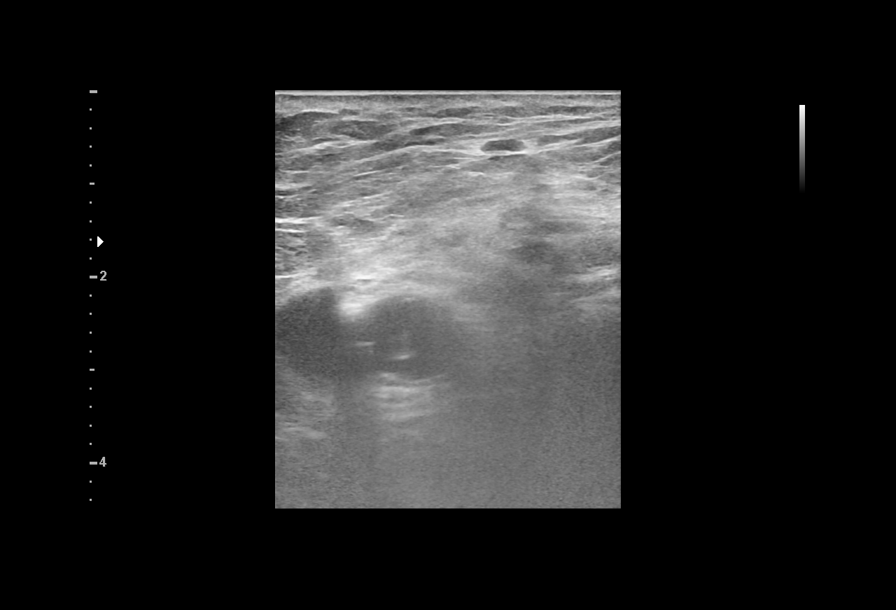
[im 3/6]
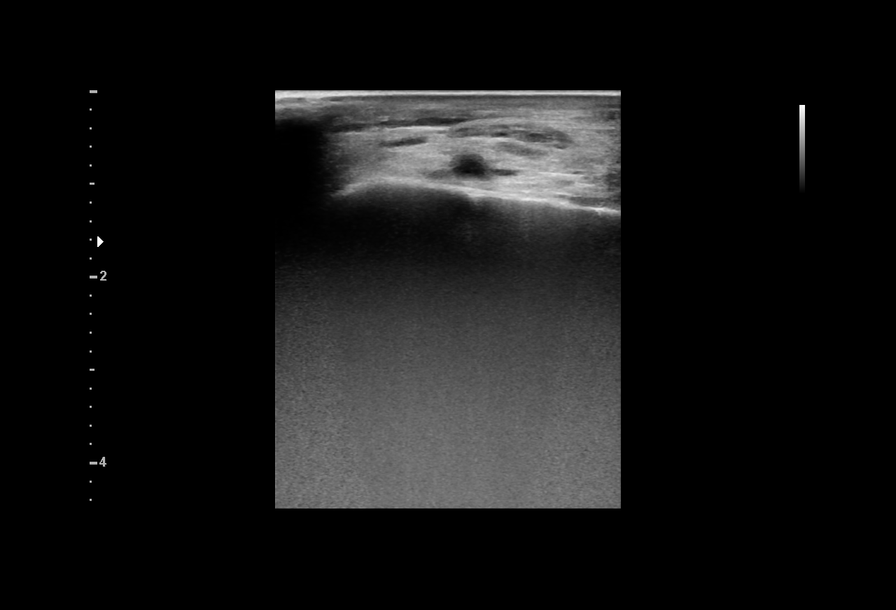
[im 4/6]
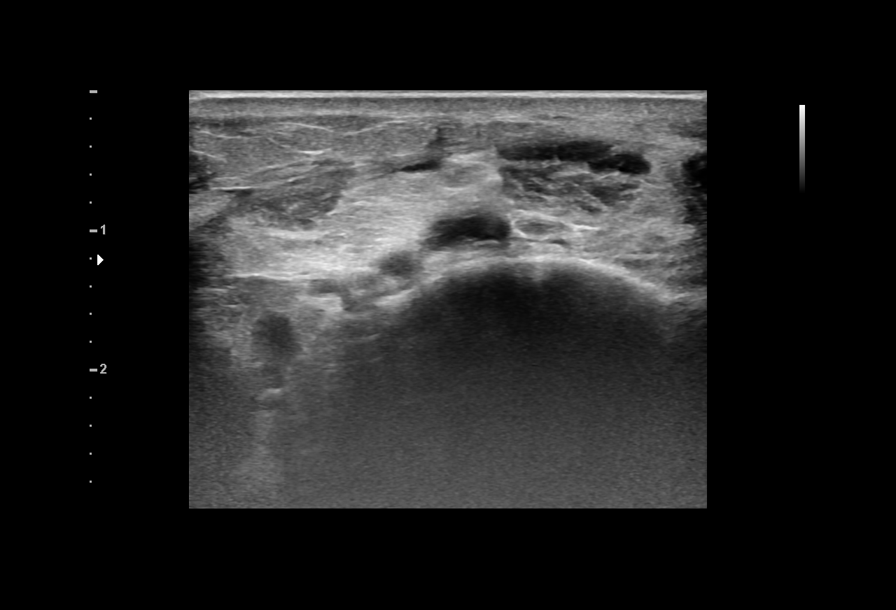
[im 5/6]
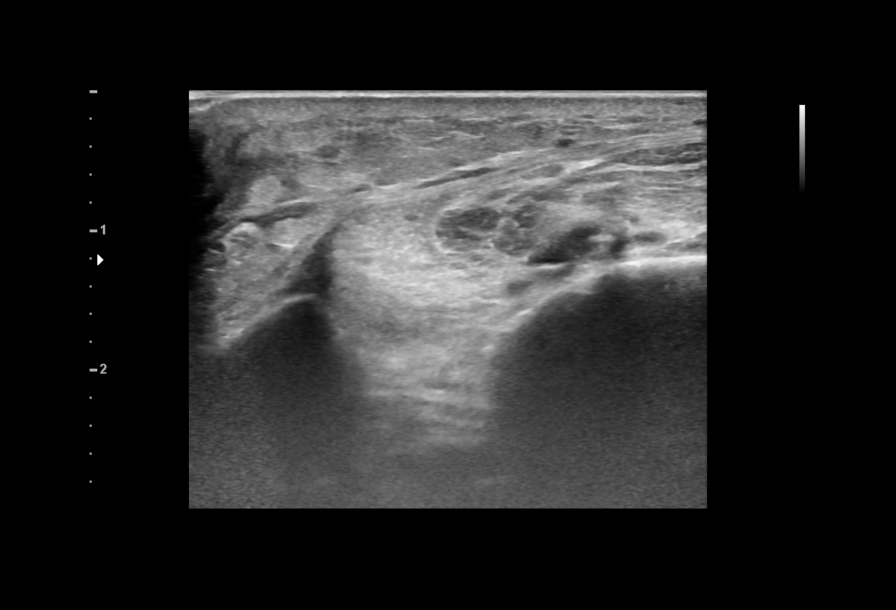
[im 6/6]
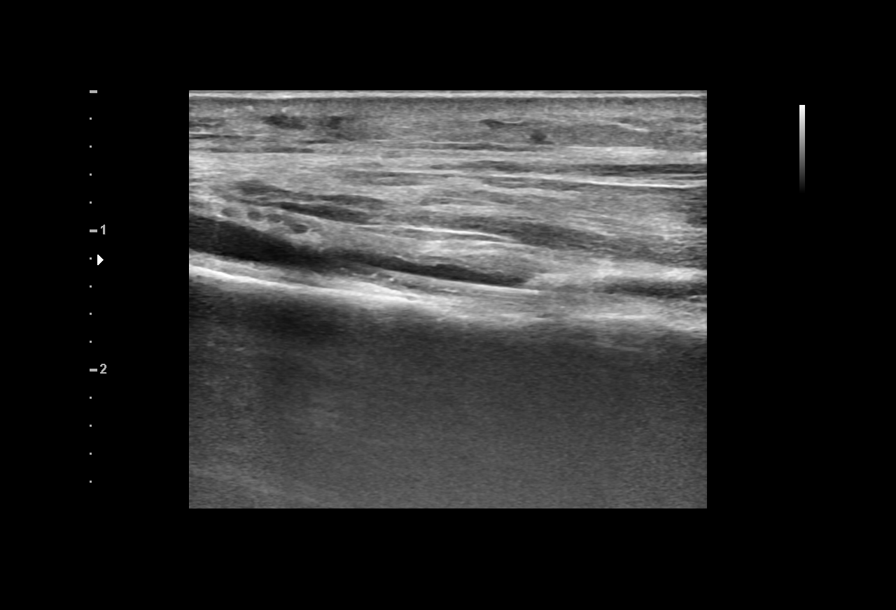

[6 of 6 positions shown; findings below may reference images not displayed]

EXAM:
ULTRASOUND-GUIDED ACCESS RIGHT COMMON FEMORAL ARTERY

ULTRASOUND GUIDED ACCESS RIGHT ANTERIOR TIBIAL ARTERY

LASER ATHERECTOMY RIGHT ANTERIOR TIBIAL ARTERY

BALLOON ANGIOPLASTY RIGHT ANTERIOR TIBIAL ARTERY

MEDICATIONS:
39888 units IV heparin, 25 mg IV protamine sulfate. 4 mg tPA,
loading dose 300 mg Plavix, 650 aspirin

ANESTHESIA/SEDATION:
Moderate (conscious) sedation was employed during this procedure. A
total of Versed 4.5 mg and Fentanyl 225 mcg was administered
intravenously.

Moderate Sedation Time: 206 minutes. The patient's level of
consciousness and vital signs were monitored continuously by
radiology nursing throughout the procedure under my direct
supervision.

CONTRAST:  60 cc

FLUOROSCOPY TIME:  Fluoroscopy Time: 31 minutes 18 seconds (39 mGy).

COMPLICATIONS:
None



Ultrasound survey of the right inguinal region was performed with
images stored and sent to PACs, confirming patency of the vessel.

1% lidocaine was used for local anesthesia. Stab incision was made
with 11 blade scalpel. A micropuncture needle was used access the
common femoral artery under ultrasound. With excellent arterial
blood flow returned, and an .018 micro wire was passed through the
needle, observed enter the SFA under fluoroscopy. The wire that we
used was a Nitrex wire. The needle was removed, and a micropuncture
sheath was placed over the wire. Because of the nature of the
vertical puncture site and the dense calcifications of the common
femoral artery, the initial placement of the micro puncture failed.
The inner dilator from a new micro puncture was introduced over the
wire, and then withdrawn. A Galt, stiff micropuncture set was then
guided over the microwire. The inner dilator and wire were removed,
and an 035 Bentson wire was advanced under fluoroscopy into the SFA.
The sheath was removed and a standard 5 French vascular sheath was
placed. The dilator was removed and the sheath was flushed.

CO2 angiogram was performed of the femoropopliteal segment.

An angled 4 French Kumpe the catheter was then advanced on the
Bentson wire to the distal superficial femoral artery. Completion
CO2 angiogram was then performed.

This identified occluded anterior tibial artery as the target, the
angiosome of distribution to the forefoot where the ulcer is
located.

Amplatz wire was then advanced through the angled catheter into the
distal SFA. The catheter was removed and the short 5 French sheath
was exchanged for 50 cm bright tip 6 French straight sheath. Tip of
the sheath was then positioned in the popliteal artery.

Full heparinization was performed.

The Kumpe the catheter was then used to navigate a 300 cm 014
command wire into the anterior tibial artery. The Kumpe the catheter
was removed and a quick cross 018 was advanced on the crossing wire
to the anterior tibial artery. We attempted to cross the anterior
tibial artery occlusion from a top down approach. The catheter and
wire clearly became extraluminal, given the dense calcifications in
the proximal third of the anterior tibial artery. We then elected to
puncture retrograde and work from below.

1% lidocaine was used for local anesthesia at the distal anterior
tibial artery. Ultrasound guidance was then used to place a micro
puncture needle into the anterior tibial artery. The initial
placement of a retrograde Nitrex wire failed given the
calcifications in the distal anterior tibial artery. This wire was
removed and a V18 wire was successful in passing retrograde in the
anterior tibial artery. A small incision was made on the needle and
the needle was removed. 018 quick cross catheter was then passed
over the V18 wire retrograde, with the catheter and wire combination
achieving a successful passage through the occluded anterior tibial
artery, to the distal SFA.

The wire was then passed into the distal aspect of the 6 French
sheath and retrograde into the angled Kumpe the catheter within the
sheath. This 300 cm V18 wire was then externalized.

The catheter was removed from the wire. The 018 quick cross catheter
was then passed antegrade over the flossing wire, distally into the
anterior tibial artery. The wire was then removed from the catheter
and a 300 cm 014 whisper wire was placed antegrade through the
catheter into the distal anterior tibial artery and the dorsalis
pedis.

We then initiated laser atherectomy. Dense calcifications were
encountered in the mid segment of the anterior tibial artery. The
laser was removed, and 2 mm balloon angioplasty was performed along
the length of the occluded anterior tibial artery. We then completed
the laser atherectomy through the mid and distal thirds and the
laser was removed.

Completion balloon angioplasty was performed with a 3 mm x 220 mm
balloon to the distal anterior tibial artery transition to the
dorsalis pedis.

After balloon angioplasty, repeat angiogram was performed with slow
flow identified through the anterior tibial artery.

Repeat balloon angioplasty along the length up to 3 mm was performed
on 3 occasions, with repeat angiography performed after each
occasion, confirming slow flow through the anterior tibial artery.

2 mm balloon angioplasty was then performed in the dorsalis pedis
towards the deep pedal loop on the forefoot.

Angiogram was again performed confirming slow flow through the
segment.

Ultimately, a pull-back fluoroscopic image was performed along the
length of the anterior tibial artery, which did not identify a
stenotic area or dissection. Hand-held Doppler was performed at the
foot which confirmed no presence of a pulse at the dorsalis pedis at
this time. We then passed a 300 cm command wire through a 2.5 mm
balloon, through the complete segment of the anterior tibial artery.
With a new wire position distally, 2.5 mm balloon angioplasty was
performed along the length of the anterior tibial artery.

At this time, we observed that there was a waist treated in the mid
third of the anterior tibial artery, which presumably represented
the flow limiting lesion that was persisting.

After this 2.5 mm treatment the balloon was withdrawn angiogram was
performed confirming flow, and a hand-held Doppler was performed at
the anterior tibial confirming excellent pulse at the dorsalis
pedis.

We then removed all wires catheters.

25 mg protamine sulfate was administered.

Sheath was removed with manual pressure for hemostasis.

Patient tolerated the procedure well and remained hemodynamically
stable throughout.

No complications were encountered and no significant blood loss.
FINDINGS: Angiogram confirms no significant stenosis of the femoropopliteal
segment. Posterior tibial artery and the peroneal artery demonstrate
slow flow to the foot. There is less than 50% narrowing in the
proximal peroneal artery just after the origin.

Occlusion of the anterior tibial artery in the mid and distal third.

After therapy, there is restoration of the lumen of the anterior
tibial artery with less than 30% narrowing.

Posterior tibial artery and anterior tibial artery pulses present at
the completion.
IMPRESSION: Status post ultrasound guided access right common femoral artery and
right anterior tibial artery for laser atherectomy and balloon
angioplasty of occluded anterior tibial artery restoring in-line
flow to the forefoot.

## 2021-10-14 ENCOUNTER — Encounter (INDEPENDENT_AMBULATORY_CARE_PROVIDER_SITE_OTHER): Payer: Self-pay

## 2021-10-16 ENCOUNTER — Other Ambulatory Visit: Payer: Self-pay | Admitting: Internal Medicine

## 2021-10-16 DIAGNOSIS — I10 Essential (primary) hypertension: Secondary | ICD-10-CM

## 2021-10-21 ENCOUNTER — Other Ambulatory Visit: Payer: Self-pay | Admitting: Internal Medicine

## 2021-10-21 DIAGNOSIS — I10 Essential (primary) hypertension: Secondary | ICD-10-CM

## 2021-10-29 ENCOUNTER — Other Ambulatory Visit: Payer: Self-pay | Admitting: Internal Medicine

## 2021-10-29 DIAGNOSIS — I739 Peripheral vascular disease, unspecified: Secondary | ICD-10-CM

## 2021-10-30 ENCOUNTER — Ambulatory Visit: Payer: Medicaid Other | Admitting: Podiatry

## 2021-11-02 ENCOUNTER — Encounter (INDEPENDENT_AMBULATORY_CARE_PROVIDER_SITE_OTHER): Payer: Self-pay | Admitting: Ophthalmology

## 2021-11-02 ENCOUNTER — Other Ambulatory Visit: Payer: Self-pay

## 2021-11-02 ENCOUNTER — Ambulatory Visit (INDEPENDENT_AMBULATORY_CARE_PROVIDER_SITE_OTHER): Payer: Medicaid Other | Admitting: Ophthalmology

## 2021-11-02 DIAGNOSIS — E113512 Type 2 diabetes mellitus with proliferative diabetic retinopathy with macular edema, left eye: Secondary | ICD-10-CM

## 2021-11-02 DIAGNOSIS — G4733 Obstructive sleep apnea (adult) (pediatric): Secondary | ICD-10-CM

## 2021-11-02 MED ORDER — BEVACIZUMAB 2.5 MG/0.1ML IZ SOSY
2.5000 mg | PREFILLED_SYRINGE | INTRAVITREAL | Status: AC | PRN
  Administered 2021-11-02: 2.5 mg via INTRAVITREAL

## 2021-11-02 NOTE — Progress Notes (Signed)
11/02/2021     CHIEF COMPLAINT Patient presents for  Chief Complaint  Patient presents with   Retina Follow Up      HISTORY OF PRESENT ILLNESS: Perry Thomas is a 60 y.o. male who presents to the clinic today for:   HPI     Retina Follow Up   Patient presents with  Diabetic Retinopathy.  In both eyes.  This started 9 weeks ago.  Duration of 9 weeks.  Since onset it is stable.        Comments   9 week f/u OU with OCT and possible Avastin injection OS  EyeMeds: Simbrinza BID OU      Last edited by Reather Littler, COA on 11/02/2021  8:09 AM.      Referring physician: Curt Jews, PA-C [id number] [place],  [place] [id number]  HISTORICAL INFORMATION:   Selected notes from the MEDICAL RECORD NUMBER    Lab Results  Component Value Date   HGBA1C 6.2 (A) 10/23/2020     CURRENT MEDICATIONS: Current Outpatient Medications (Ophthalmic Drugs)  Medication Sig   SIMBRINZA 1-0.2 % SUSP Place 1 drop into both eyes in the morning and at bedtime.   No current facility-administered medications for this visit. (Ophthalmic Drugs)   Current Outpatient Medications (Other)  Medication Sig   amLODipine (NORVASC) 10 MG tablet Take 1 tablet (10 mg total) by mouth daily.   aspirin 81 MG EC tablet Take 81 mg by mouth daily.   carvedilol (COREG) 12.5 MG tablet Take 1 tablet (12.5 mg total) by mouth 2 (two) times daily with a meal.   clopidogrel (PLAVIX) 75 MG tablet Take 1 tablet (75 mg total) by mouth daily.   Continuous Blood Gluc Sensor (DEXCOM G6 SENSOR) MISC APPLY 1 SENSOR TO THE SKIN EVERY 10 DAYS FOR CONTINUOUS GLUCOSE MONITORING.   Continuous Blood Gluc Transmit (DEXCOM G6 TRANSMITTER) MISC USE AS DIRECTED FOR CONTINUOUS GLUCOSE MONITORING. USE FOR 90 DAYS THEN DISCARD & REPLACE   doxycycline (VIBRA-TABS) 100 MG tablet Take 1 tablet (100 mg total) by mouth 2 (two) times daily.   doxycycline (VIBRA-TABS) 100 MG tablet Take 1 tablet (100 mg total) by mouth 2  (two) times daily.   Dulaglutide (TRULICITY) 4.31 VQ/0.0QQ SOPN Inject 0.75 mg into the muscle every Sunday.   furosemide (LASIX) 80 MG tablet Take 40 mg by mouth daily as needed for fluid.   insulin glargine (LANTUS SOLOSTAR) 100 UNIT/ML Solostar Pen Inject 12 Units into the skin at bedtime.   Lancets (ONETOUCH ULTRASOFT) lancets Use to check blood sugar before breakfast and before supper. E11.65   mycophenolate (MYFORTIC) 180 MG EC tablet Take 360 mg by mouth 2 (two) times daily.   pantoprazole (PROTONIX) 40 MG tablet TAKE 1 TABLET BY MOUTH EVERY DAY (Patient taking differently: Take 40 mg by mouth daily.)   predniSONE (DELTASONE) 5 MG tablet Take 5 mg by mouth daily with breakfast.   sulfamethoxazole-trimethoprim (BACTRIM) 400-80 MG tablet Take 1 tablet by mouth every Monday, Wednesday, and Friday.   tacrolimus (PROGRAF) 1 MG capsule Take 3 mg by mouth 2 (two) times daily.   No current facility-administered medications for this visit. (Other)      REVIEW OF SYSTEMS:    ALLERGIES No Known Allergies  PAST MEDICAL HISTORY Past Medical History:  Diagnosis Date   Atypical chest pain 05/30/2019   Diabetes mellitus without complication (HCC)    Elevated PSA, less than 10 ng/ml 03/31/2017   ESRD (end  stage renal disease) on dialysis ([place]) 01/30/2016   ESRD on dialysis [facility])    Hypertension    Hypertension associated with diabetes ([place]) 01/30/2016   Nuclear sclerotic cataract of right eye 08/25/2020   PAD (peripheral artery disease) ([place])    Posterior subcapsular age-related cataract, right eye 08/25/2020   Type 2 diabetes mellitus ([place]) 01/30/2016   Past Surgical History:  Procedure Laterality Date   IR ANGIOGRAM EXTREMITY RIGHT  04/22/2021   IR RADIOLOGIST EVAL & MGMT  03/26/2021   IR RADIOLOGIST EVAL & MGMT  05/27/2021   IR TIB-PERO ART ATHEREC INC PTA MOD SED  04/22/2021   IR US GUIDE VASC ACCESS RIGHT  04/22/2021   IR US GUIDE VASC ACCESS RIGHT  04/22/2021   PTCA     peripheral  artery disease    FAMILY HISTORY Family History  Problem Relation Age of Onset   Healthy Mother    Liver cancer Father    Kidney disease Sister     SOCIAL HISTORY Social History   Tobacco Use   Smoking status: Former    Types: Cigarettes    Quit date: 12/13/2009    Years since quitting: 11.8   Smokeless tobacco: Never  Vaping Use   Vaping Use: Never used  Substance Use Topics   Alcohol use: No    Alcohol/week: 0.0 standard drinks   Drug use: No         OPHTHALMIC EXAM:  Base Eye Exam     Visual Acuity (ETDRS)       Right Left   Dist cc 20/25 20/100 +1   Dist ph cc  20/60 -1    Correction: Glasses         Tonometry (Tonopen, 8:18 AM)       Right Left   Pressure 10 11         Pupils       Dark Light Shape React APD   Right 5 4 Irregular Sluggish None   Left 5 4 Irregular Sluggish None         Visual Fields (Counting fingers)       Left Right    Full Full         Extraocular Movement       Right Left    Full, Ortho Full, Ortho         Neuro/Psych     Oriented x3: Yes   Mood/Affect: Normal         Dilation     Both eyes: 1.0% Mydriacyl, 2.5% Phenylephrine @ 8:18 AM           Slit Lamp and Fundus Exam     External Exam       Right Left   External Normal Normal         Slit Lamp Exam       Right Left   Lids/Lashes Normal Normal   Conjunctiva/Sclera White and quiet White and quiet   Cornea Clear Clear   Anterior Chamber Deep and quiet Deep and quiet   Iris Round and reactive Round and reactive   Lens Well centered multifocal IOL Posterior chamber intraocular lens, Centered posterior chamber intraocular lens   Anterior Vitreous Normal Small remnant of oil in the anterior hyaloid         Fundus Exam       Right Left   Posterior Vitreous Clear posteriorly Clear posteriorly   Disc 1+ Optic disc atrophy, 1+ Pallor 1+ Optic disc atrophy, 1+ Pallor  C/D Ratio 0.85 0.85   Macula Microaneurysms, no macular  thickening Microaneurysms, Macular thickening, Mild clinically significant macular edema   Vessels PDR-quiet PDR-quiet   Periphery Good PRP peripherally, room for PRP superiorly OD with small area of NVE Good PRP peripherally, no room for PRP            IMAGING AND PROCEDURES  Imaging and Procedures for 11/02/21  OCT, Retina - OU - Both Eyes       Right Eye Quality was good. Scan locations included subfoveal. Central Foveal Thickness: 253. Progression has been stable. Findings include abnormal foveal contour, no SRF, no IRF.   Left Eye Quality was good. Scan locations included subfoveal. Central Foveal Thickness: 305. Progression has improved. Findings include abnormal foveal contour, cystoid macular edema.   Notes Much improved CSME OD, will observe   CSME OS, increase now nasal to FAZ, much less at 9 weeks post most recent evaluation yet still active nasal to FAZ OS, will repeat injection Avastin OS today     Intravitreal Injection, Pharmacologic Agent - OS - Left Eye       Time Out 11/02/2021. 8:43 AM. Confirmed correct patient, procedure, site, and patient consented.   Anesthesia Topical anesthesia was used. Anesthetic medications included Lidocaine 4%.   Procedure Preparation included Ofloxacin , Tobramycin 0.3%, 10% betadine to eyelids, 5% betadine to ocular surface. A supplied needle was used.   Injection: 2.5 mg bevacizumab 2.5 MG/0.1ML   Route: Intravitreal, Site: Left Eye   NDC: [phone], Lot: [id number]   Post-op Post injection exam found visual acuity of at least counting fingers. The patient tolerated the procedure well. There were no complications. The patient received written and verbal post procedure care education. Post injection medications included ocuflox.              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  Repeat injection today to control.  There may be some ongoing role of untreated OSA as patient unable to tolerate CPAP therapy in the past 1 to 2 years previous     ICD-10-CM   1. 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 OSA (obstructive sleep apnea)  G47.33       1.  OS with CSME, recurrent nasally, will need repeat intravitreal Avastin OS today and will and shorten interval follow-up as patient probably has some nightly hypoxic stress to the macula from untreated OSA as noted above  2.  I did suggest that patient consider follow-up with medical doctor in order to make every arrangement possible to comply or to adapt to the use of CPAP if in fact OSA continues  3.  Ophthalmic Meds Ordered this visit:  Meds ordered this encounter  Medications   bevacizumab (AVASTIN) SOSY 2.5 mg       Return in about 7 weeks (around 12/21/2021) for dilate, OS, AVASTIN OCT.  There are no Patient Instructions on file for this visit.  Explained the diagnoses, plan, and follow up with the patient and they expressed understanding.  Patient expressed understanding of the importance of proper follow up care.   Clent Demark Anuradha Chabot M.D. Diseases & Surgery of the Retina and Vitreous [facility]ina [facility] Diabetic [place] 11/02/21     Abbreviations: M myopia (nearsighted); A astigmatism; H hyperopia (farsighted); P presbyopia; Mrx spectacle prescription;  CTL contact lenses; OD right eye; OS left eye; OU both eyes  XT exotropia; ET esotropia; PEK punctate epithelial  keratitis; PEE punctate epithelial erosions; DES dry eye syndrome; MGD meibomian gland dysfunction; ATs artificial tears; PFAT's preservative free artificial tears; [place] nuclear sclerotic cataract; PSC posterior subcapsular cataract; ERM epi-retinal membrane; PVD posterior vitreous detachment; RD retinal detachment; DM diabetes mellitus; DR diabetic retinopathy; NPDR non-proliferative diabetic retinopathy; PDR proliferative diabetic retinopathy; CSME clinically significant macular edema; DME diabetic macular edema; dbh dot blot hemorrhages; CWS cotton wool spot; POAG primary open angle glaucoma; C/D cup-to-disc ratio; HVF humphrey visual field; GVF goldmann visual field; OCT optical coherence tomography; IOP intraocular pressure; BRVO Branch retinal vein occlusion; CRVO central retinal vein occlusion; CRAO central retinal artery occlusion; BRAO branch retinal artery occlusion; RT retinal tear; SB scleral buckle; PPV pars plana vitrectomy; VH Vitreous hemorrhage; PRP panretinal laser photocoagulation; IVK intravitreal kenalog; VMT vitreomacular traction; MH Macular hole;  NVD neovascularization of the disc; NVE neovascularization elsewhere; AREDS age related eye disease study; ARMD age related macular degeneration; POAG primary open angle glaucoma; EBMD epithelial/anterior basement membrane dystrophy; ACIOL anterior chamber intraocular lens; IOL intraocular lens; PCIOL posterior chamber intraocular lens; Phaco/IOL phacoemulsification with intraocular lens placement; [place] photorefractive keratectomy; LASIK laser assisted in situ keratomileusis; HTN hypertension; DM diabetes mellitus; COPD chronic obstructive pulmonary disease

## 2021-11-02 NOTE — Assessment & Plan Note (Signed)
OS with chronic active CSME, only at 9-week follow-up interval post Avastin.  Repeat injection today to control.  There may be some ongoing role of untreated OSA as patient unable to tolerate CPAP therapy in the past 1 to 2 years previous

## 2021-11-02 NOTE — Assessment & Plan Note (Signed)
I explained the patient that untreated sleep apnea triggers nightly low oxygen stress to the macula which can make the treatment of his eye condition more resistant

## 2021-11-09 ENCOUNTER — Ambulatory Visit (INDEPENDENT_AMBULATORY_CARE_PROVIDER_SITE_OTHER): Payer: Medicaid Other | Admitting: Podiatry

## 2021-11-09 ENCOUNTER — Encounter: Payer: Self-pay | Admitting: Podiatry

## 2021-11-09 ENCOUNTER — Other Ambulatory Visit: Payer: Self-pay

## 2021-11-09 DIAGNOSIS — I739 Peripheral vascular disease, unspecified: Secondary | ICD-10-CM

## 2021-11-09 DIAGNOSIS — M79676 Pain in unspecified toe(s): Secondary | ICD-10-CM | POA: Diagnosis not present

## 2021-11-09 DIAGNOSIS — E119 Type 2 diabetes mellitus without complications: Secondary | ICD-10-CM

## 2021-11-09 DIAGNOSIS — N186 End stage renal disease: Secondary | ICD-10-CM

## 2021-11-09 DIAGNOSIS — B351 Tinea unguium: Secondary | ICD-10-CM

## 2021-11-09 DIAGNOSIS — D689 Coagulation defect, unspecified: Secondary | ICD-10-CM

## 2021-11-09 NOTE — Progress Notes (Signed)
Patient ID: Perry Thomas, male   DOB: 1961-01-30, 60 y.o.   MRN: [id number] Complaint:  Visit Type: Patient returns to my office for continued preventative foot care services. Complaint: Patient states" my nails have grown long and thick and become painful to walk and wear shoes" Patient has been diagnosed with DM with no foot complications.  Patient has history of kidney transplant.. The patient presents for preventative foot care services. No changes to ROS.  Patient is taking plavix which causes coagulation defect.    Podiatric Exam: Vascular: dorsalis pedis and posterior tibial pulses are weakly palpable bilateral. Capillary return is immediate. Cold feet. Skin turgor WNL Absent digital hair  B/L. Sensorium: Normal Semmes Weinstein monofilament test. Normal tactile sensation bilaterally. Nail Exam: Pt has thick disfigured discolored nails with subungual debris noted bilateral entire nail hallux through fifth toenails Ulcer Exam: There is no evidence of ulcer or pre-ulcerative changes or infection. Orthopedic Exam: Muscle tone and strength are WNL. No limitations in general ROM. No crepitus or effusions noted. Foot type and digits show no abnormalities. Bony prominences are unremarkable. Skin:  Porokeratosis sub 5 left.. No infection or ulcers.  Healing skin ulcer right forefoot.  Diagnosis:  Onychomycosis, , Pain in right toe, pain in left toes    Treatment & Plan Procedures and Treatment: Consent by patient was obtained for treatment procedures. The patient understood the discussion of treatment and procedures well. All questions were answered thoroughly reviewed. Debridement of mycotic and hypertrophic toenails, 1 through 5 bilateral and clearing of subungual debris. No ulceration, no infection noted.  Return Visit-Office Procedure: Patient instructed to return to the office for a follow up visit 10 weeks  for continued evaluation and treatment.    Gardiner Barefoot DPM

## 2021-11-27 ENCOUNTER — Other Ambulatory Visit: Payer: Self-pay | Admitting: Student

## 2021-12-18 ENCOUNTER — Other Ambulatory Visit: Payer: Self-pay | Admitting: Student

## 2021-12-21 ENCOUNTER — Encounter (INDEPENDENT_AMBULATORY_CARE_PROVIDER_SITE_OTHER): Payer: Medicaid Other | Admitting: Ophthalmology

## 2022-01-06 ENCOUNTER — Encounter (INDEPENDENT_AMBULATORY_CARE_PROVIDER_SITE_OTHER): Payer: Self-pay | Admitting: Ophthalmology

## 2022-01-06 ENCOUNTER — Other Ambulatory Visit: Payer: Self-pay

## 2022-01-06 ENCOUNTER — Ambulatory Visit (INDEPENDENT_AMBULATORY_CARE_PROVIDER_SITE_OTHER): Payer: Medicaid Other | Admitting: Ophthalmology

## 2022-01-06 DIAGNOSIS — E113511 Type 2 diabetes mellitus with proliferative diabetic retinopathy with macular edema, right eye: Secondary | ICD-10-CM | POA: Diagnosis not present

## 2022-01-06 DIAGNOSIS — E113512 Type 2 diabetes mellitus with proliferative diabetic retinopathy with macular edema, left eye: Secondary | ICD-10-CM

## 2022-01-06 MED ORDER — BEVACIZUMAB 2.5 MG/0.1ML IZ SOSY
2.5000 mg | PREFILLED_SYRINGE | INTRAVITREAL | Status: AC | PRN
  Administered 2022-01-06: 10:00:00 2.5 mg via INTRAVITREAL

## 2022-01-06 NOTE — Progress Notes (Signed)
01/06/2022     CHIEF COMPLAINT Patient presents for  Chief Complaint  Patient presents with   Retina Follow Up      HISTORY OF PRESENT ILLNESS: Perry Thomas is a 61 y.o. male who presents to the clinic today for:   HPI     Retina Follow Up           Diagnosis: Other   Laterality: left eye   Onset: 9 weeks ago   Severity: mild   Duration: 9 weeks   Course: stable         Comments   9 weeks fu OS oct Avastin OS (rs from 1/9). Patient states vision is stable and unchanged since last visit. Denies any new floaters or FOL.       Last edited by Laurin Coder on 01/06/2022  9:07 AM.      Referring physician: Curt Jews, PA-C [id number] [place],  [place] [id number]  HISTORICAL INFORMATION:   Selected notes from the MEDICAL RECORD NUMBER    Lab Results  Component Value Date   HGBA1C 6.2 (A) 10/23/2020     CURRENT MEDICATIONS: Current Outpatient Medications (Ophthalmic Drugs)  Medication Sig   SIMBRINZA 1-0.2 % SUSP Place 1 drop into both eyes in the morning and at bedtime.   No current facility-administered medications for this visit. (Ophthalmic Drugs)   Current Outpatient Medications (Other)  Medication Sig   amLODipine (NORVASC) 10 MG tablet Take 1 tablet (10 mg total) by mouth daily.   aspirin 81 MG EC tablet Take 81 mg by mouth daily.   carvedilol (COREG) 12.5 MG tablet Take 1 tablet (12.5 mg total) by mouth 2 (two) times daily with a meal.   clopidogrel (PLAVIX) 75 MG tablet Take 1 tablet (75 mg total) by mouth daily.   Continuous Blood Gluc Sensor (DEXCOM G6 SENSOR) MISC APPLY 1 SENSOR TO THE SKIN EVERY 10 DAYS FOR CONTINUOUS GLUCOSE MONITORING.   Continuous Blood Gluc Transmit (DEXCOM G6 TRANSMITTER) MISC USE AS DIRECTED FOR CONTINUOUS GLUCOSE MONITORING. USE FOR 90 DAYS THEN DISCARD & REPLACE   doxycycline (VIBRA-TABS) 100 MG tablet Take 1 tablet (100 mg total) by mouth 2 (two) times daily.   doxycycline (VIBRA-TABS) 100 MG  tablet Take 1 tablet (100 mg total) by mouth 2 (two) times daily.   Dulaglutide (TRULICITY) 8.50 YD/7.4JO SOPN Inject 0.75 mg into the muscle every Sunday.   furosemide (LASIX) 80 MG tablet Take 40 mg by mouth daily as needed for fluid.   insulin glargine (LANTUS SOLOSTAR) 100 UNIT/ML Solostar Pen Inject 12 Units into the skin at bedtime.   Lancets (ONETOUCH ULTRASOFT) lancets Use to check blood sugar before breakfast and before supper. E11.65   mycophenolate (MYFORTIC) 180 MG EC tablet Take 360 mg by mouth 2 (two) times daily.   pantoprazole (PROTONIX) 40 MG tablet TAKE 1 TABLET BY MOUTH EVERY DAY (Patient taking differently: Take 40 mg by mouth daily.)   predniSONE (DELTASONE) 5 MG tablet Take 5 mg by mouth daily with breakfast.   sulfamethoxazole-trimethoprim (BACTRIM) 400-80 MG tablet Take 1 tablet by mouth every Monday, Wednesday, and Friday.   tacrolimus (PROGRAF) 1 MG capsule Take 3 mg by mouth 2 (two) times daily.   No current facility-administered medications for this visit. (Other)      REVIEW OF SYSTEMS:    ALLERGIES No Known Allergies  PAST MEDICAL HISTORY Past Medical History:  Diagnosis Date   Atypical chest pain 05/30/2019   Diabetes  mellitus without complication (HCC)    Elevated PSA, less than 10 ng/ml 03/31/2017   ESRD (end stage renal disease) on dialysis ([place]) 01/30/2016   ESRD on dialysis [facility]    Hypertension    Hypertension associated with diabetes ([place]) 01/30/2016   Nuclear sclerotic cataract of right eye 08/25/2020   PAD (peripheral artery disease) ([place])    Posterior subcapsular age-related cataract, right eye 08/25/2020   Type 2 diabetes mellitus ([place]) 01/30/2016   Past Surgical History:  Procedure Laterality Date   IR ANGIOGRAM EXTREMITY RIGHT  04/22/2021   IR RADIOLOGIST EVAL & MGMT  03/26/2021   IR RADIOLOGIST EVAL & MGMT  05/27/2021   IR TIB-PERO ART ATHEREC INC PTA MOD SED  04/22/2021   IR US GUIDE VASC ACCESS RIGHT  04/22/2021   IR US GUIDE VASC ACCESS  RIGHT  04/22/2021   PTCA     peripheral artery disease    FAMILY HISTORY Family History  Problem Relation Age of Onset   Healthy Mother    Liver cancer Father    Kidney disease Sister     SOCIAL HISTORY Social History   Tobacco Use   Smoking status: Former    Types: Cigarettes    Quit date: 12/13/2009    Years since quitting: 12.0   Smokeless tobacco: Never  Vaping Use   Vaping Use: Never used  Substance Use Topics   Alcohol use: No    Alcohol/week: 0.0 standard drinks   Drug use: No         OPHTHALMIC EXAM:  Base Eye Exam     Visual Acuity (ETDRS)       Right Left   Dist [place] 20/20 20/60 -2   Dist ph [place]  20/50 -1  Per patient, left eye is distance.        Tonometry (Tonopen, 9:09 AM)       Right Left   Pressure 17 15         Pupils       Shape React APD   Right Irregular Sluggish None   Left Irregular Sluggish None         Extraocular Movement       Right Left    Full Full         Neuro/Psych     Oriented x3: Yes   Mood/Affect: Normal         Dilation     Left eye: 1.0% Mydriacyl, 2.5% Phenylephrine @ 9:09 AM           Slit Lamp and Fundus Exam     External Exam       Right Left   External Normal Normal         Slit Lamp Exam       Right Left   Lids/Lashes Normal Normal   Conjunctiva/Sclera White and quiet White and quiet   Cornea Clear Clear   Anterior Chamber Deep and quiet Deep and quiet   Iris Round and reactive Round and reactive   Lens Well centered multifocal IOL Posterior chamber intraocular lens, Centered posterior chamber intraocular lens   Anterior Vitreous Normal Small remnant of oil in the anterior hyaloid         Fundus Exam       Right Left   Posterior Vitreous  Clear posteriorly   Disc  1+ Optic disc atrophy, 1+ Pallor   C/D Ratio  0.85   Macula  Microaneurysms, Macular thickening, Mild clinically significant macular edema  Vessels  PDR-quiet   Periphery  Good PRP peripherally, no  room for PRP            IMAGING AND PROCEDURES  Imaging and Procedures for 01/06/22  Intravitreal Injection, Pharmacologic Agent - OS - Left Eye       Time Out 01/06/2022. 9:54 AM. Confirmed correct patient, procedure, site, and patient consented.   Anesthesia Topical anesthesia was used. Anesthetic medications included Lidocaine 4%.   Procedure Preparation included Tobramycin 0.3%, 10% betadine to eyelids, 5% betadine to ocular surface. A supplied needle was used.   Injection: 2.5 mg bevacizumab 2.5 MG/0.1ML   Route: Intravitreal, Site: Left Eye   NDC: [phone], Lot: [id number]   Post-op Post injection exam found visual acuity of at least counting fingers. The patient tolerated the procedure well. There were no complications. The patient received written and verbal post procedure care education. Post injection medications included ocuflox.      OCT, Retina - OU - Both Eyes       Right Eye Quality was good. Scan locations included subfoveal. Central Foveal Thickness: 297. Progression has been stable. Findings include abnormal foveal contour, no SRF, no IRF.   Left Eye Quality was good. Scan locations included subfoveal. Central Foveal Thickness: 305. Progression has improved. Findings include abnormal foveal contour, cystoid macular edema.   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  Currently 9-week interval.  Repeat injection today will likely observe henceforth.  PDR quiet  Diabetic macular edema of right eye with proliferative retinopathy associated with type 2 diabetes mellitus ([place]) OD PDR quiet, recurrent CSME  temporally however.  We will need reevaluation by clinical examination likely Avastin next OD in 6 weeks      ICD-10-CM   1. 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 Diabetic macular edema of right eye with proliferative retinopathy associated with type 2 diabetes mellitus (HCC)  E11.3511       1.  OS, chronic active CSME stable though at 9 weeks repeat injection today to maintain  2.  OD, recurrent CSME will need reevaluation clinically in 6 weeks and likely injection Avastin OD  3.  Ophthalmic Meds Ordered this visit:  Meds ordered this encounter  Medications   bevacizumab (AVASTIN) SOSY 2.5 mg       Return in about 6 weeks (around 02/17/2022) for dilate, OD, AVASTIN OCT.  There are no Patient Instructions on file for this visit.   Explained the diagnoses, plan, and follow up with the patient and they expressed understanding.  Patient expressed understanding of the importance of proper follow up care.   Clent Demark Corben Auzenne M.D. Diseases & Surgery of the Retina and Vitreous [facility]ina [facility] Di[facility] [place] 01/06/22     Abbreviations: M myopia (nearsighted); A astigmatism; H hyperopia (farsighted); P presbyopia; Mrx spectacle prescription;  CTL contact lenses; OD right eye; OS left  eye; OU both eyes  XT exotropia; ET esotropia; PEK punctate epithelial keratitis; PEE punctate epithelial erosions; DES dry eye syndrome; MGD meibomian gland dysfunction; ATs artificial tears; PFAT's preservative free artificial tears; [place] nuclear sclerotic cataract; PSC posterior subcapsular cataract; ERM epi-retinal membrane; PVD posterior vitreous detachment; RD retinal detachment; DM diabetes mellitus; DR diabetic retinopathy; NPDR non-proliferative diabetic retinopathy; PDR proliferative  diabetic retinopathy; CSME clinically significant macular edema; DME diabetic macular edema; dbh dot blot hemorrhages; CWS cotton wool spot; POAG primary open angle glaucoma; C/D cup-to-disc ratio; HVF humphrey visual field; GVF goldmann visual field; OCT optical coherence tomography; IOP intraocular pressure; BRVO Branch retinal vein occlusion; CRVO central retinal vein occlusion; CRAO central retinal artery occlusion; BRAO branch retinal artery occlusion; RT retinal tear; SB scleral buckle; PPV pars plana vitrectomy; VH Vitreous hemorrhage; PRP panretinal laser photocoagulation; IVK intravitreal kenalog; VMT vitreomacular traction; MH Macular hole;  NVD neovascularization of the disc; NVE neovascularization elsewhere; AREDS age related eye disease study; ARMD age related macular degeneration; POAG primary open angle glaucoma; EBMD epithelial/anterior basement membrane dystrophy; ACIOL anterior chamber intraocular lens; IOL intraocular lens; PCIOL posterior chamber intraocular lens; Phaco/IOL phacoemulsification with intraocular lens placement; [place] photorefractive keratectomy; LASIK laser assisted in situ keratomileusis; HTN hypertension; DM diabetes mellitus; COPD chronic obstructive pulmonary disease

## 2022-01-06 NOTE — Assessment & Plan Note (Signed)
OD PDR quiet, recurrent CSME temporally however.  We will need reevaluation by clinical examination likely Avastin next OD in 6 weeks

## 2022-01-06 NOTE — Assessment & Plan Note (Signed)
OS, much less CSME yet still active.  Currently 9-week interval.  Repeat injection today will likely observe henceforth.  PDR quiet

## 2022-02-10 ENCOUNTER — Encounter: Payer: Self-pay | Admitting: Podiatry

## 2022-02-10 ENCOUNTER — Encounter (INDEPENDENT_AMBULATORY_CARE_PROVIDER_SITE_OTHER): Payer: Self-pay

## 2022-02-10 ENCOUNTER — Ambulatory Visit (INDEPENDENT_AMBULATORY_CARE_PROVIDER_SITE_OTHER): Payer: Medicaid Other | Admitting: Podiatry

## 2022-02-10 ENCOUNTER — Other Ambulatory Visit: Payer: Self-pay

## 2022-02-10 DIAGNOSIS — M79676 Pain in unspecified toe(s): Secondary | ICD-10-CM

## 2022-02-10 DIAGNOSIS — B351 Tinea unguium: Secondary | ICD-10-CM | POA: Diagnosis not present

## 2022-02-10 DIAGNOSIS — N186 End stage renal disease: Secondary | ICD-10-CM

## 2022-02-10 DIAGNOSIS — E119 Type 2 diabetes mellitus without complications: Secondary | ICD-10-CM

## 2022-02-10 DIAGNOSIS — D689 Coagulation defect, unspecified: Secondary | ICD-10-CM

## 2022-02-10 NOTE — Progress Notes (Signed)
Patient ID: Perry Thomas, male   DOB: Mar 20, 1961, 61 y.o.   MRN: [id number] ?Complaint:  ?Visit Type: Patient returns to my office for continued preventative foot care services. Complaint: Patient states" my nails have grown long and thick and become painful to walk and wear shoes" Patient has been diagnosed with DM with no foot complications.  Patient has history of kidney transplant.. The patient presents for preventative foot care services. No changes to ROS.  Patient is taking plavix which causes coagulation defect.   ? ?Podiatric Exam: ?Vascular: dorsalis pedis and posterior tibial pulses are palpable bilateral. Capillary return is immediate. Cold feet. Skin turgor WNL Absent digital hair  B/L. ?Sensorium: Normal Semmes Weinstein monofilament test. Normal tactile sensation bilaterally. ?Nail Exam: Pt has thick disfigured discolored nails with subungual debris noted bilateral entire nail hallux through fifth toenails ?Ulcer Exam: There is no evidence of ulcer or pre-ulcerative changes or infection. ?Orthopedic Exam: Muscle tone and strength are WNL. No limitations in general ROM. No crepitus or effusions noted. Foot type and digits show no abnormalities. Bony prominences are unremarkable. ?Skin:  Porokeratosis sub 5 left.. No infection or ulcers.  Healing skin ulcer right forefoot. ? ?Diagnosis:  ?Onychomycosis, , Pain in right toe, pain in left toes   ? ?Treatment & Plan ?Procedures and Treatment: Consent by patient was obtained for treatment procedures. The patient understood the discussion of treatment and procedures well. All questions were answered thoroughly reviewed. Debridement of mycotic and hypertrophic toenails, 1 through 5 bilateral and clearing of subungual debris. No ulceration, no infection noted. Checked his pulses since his vascular surgery and the pulses were intact. ?Return Visit-Office Procedure: Patient instructed to return to the office for a follow up visit 10 weeks  for continued evaluation  and treatment. ? ? ? ?Gardiner Barefoot DPM ?

## 2022-02-17 ENCOUNTER — Other Ambulatory Visit: Payer: Self-pay

## 2022-02-17 ENCOUNTER — Ambulatory Visit (INDEPENDENT_AMBULATORY_CARE_PROVIDER_SITE_OTHER): Payer: Medicaid Other | Admitting: Ophthalmology

## 2022-02-17 ENCOUNTER — Encounter (INDEPENDENT_AMBULATORY_CARE_PROVIDER_SITE_OTHER): Payer: Self-pay | Admitting: Ophthalmology

## 2022-02-17 DIAGNOSIS — E113511 Type 2 diabetes mellitus with proliferative diabetic retinopathy with macular edema, right eye: Secondary | ICD-10-CM

## 2022-02-17 DIAGNOSIS — E113512 Type 2 diabetes mellitus with proliferative diabetic retinopathy with macular edema, left eye: Secondary | ICD-10-CM | POA: Diagnosis not present

## 2022-02-17 MED ORDER — BEVACIZUMAB 2.5 MG/0.1ML IZ SOSY
2.5000 mg | PREFILLED_SYRINGE | INTRAVITREAL | Status: AC | PRN
  Administered 2022-02-17: 2.5 mg via INTRAVITREAL

## 2022-02-17 NOTE — Progress Notes (Signed)
02/17/2022     CHIEF COMPLAINT Patient presents for  Chief Complaint  Patient presents with   Diabetic Retinopathy with Macular Edema      HISTORY OF PRESENT ILLNESS: Perry Thomas is a 61 y.o. male who presents to the clinic today for:   HPI   6 weeks for dilate, OD AVASTIN OCT. Pt states no vision changes and medical history. Pt is currently taking SIMBRINZA 2X daily.  Pt states, "After I put in the eye drops, a cobb-webb looking thing appears."  Last edited by Silvestre Moment on 02/17/2022  9:05 AM.      Referring physician: Curt Jews, PA-C [id number] [place],  [place] [address]  HISTORICAL INFORMATION:   Selected notes from the MEDICAL RECORD NUMBER    Lab Results  Component Value Date   HGBA1C 6.2 (A) 10/23/2020     CURRENT MEDICATIONS: Current Outpatient Medications (Ophthalmic Drugs)  Medication Sig   SIMBRINZA 1-0.2 % SUSP Place 1 drop into both eyes in the morning and at bedtime.   No current facility-administered medications for this visit. (Ophthalmic Drugs)   Current Outpatient Medications (Other)  Medication Sig   amLODipine (NORVASC) 10 MG tablet Take 1 tablet (10 mg total) by mouth daily.   aspirin 81 MG EC tablet Take 81 mg by mouth daily.   carvedilol (COREG) 12.5 MG tablet Take 1 tablet (12.5 mg total) by mouth 2 (two) times daily with a meal.   clopidogrel (PLAVIX) 75 MG tablet Take 1 tablet (75 mg total) by mouth daily.   Continuous Blood Gluc Sensor (DEXCOM G6 SENSOR) MISC APPLY 1 SENSOR TO THE SKIN EVERY 10 DAYS FOR CONTINUOUS GLUCOSE MONITORING.   Continuous Blood Gluc Transmit (DEXCOM G6 TRANSMITTER) MISC USE AS DIRECTED FOR CONTINUOUS GLUCOSE MONITORING. USE FOR 90 DAYS THEN DISCARD & REPLACE   doxycycline (VIBRA-TABS) 100 MG tablet Take 1 tablet (100 mg total) by mouth 2 (two) times daily.   doxycycline (VIBRA-TABS) 100 MG tablet Take 1 tablet (100 mg total) by mouth 2 (two) times daily.   Dulaglutide (TRULICITY) 8.25 KN/3.9JQ  SOPN Inject 0.75 mg into the muscle every Sunday.   furosemide (LASIX) 80 MG tablet Take 40 mg by mouth daily as needed for fluid.   insulin glargine (LANTUS SOLOSTAR) 100 UNIT/ML Solostar Pen Inject 12 Units into the skin at bedtime.   Lancets (ONETOUCH ULTRASOFT) lancets Use to check blood sugar before breakfast and before supper. E11.65   mycophenolate (MYFORTIC) 180 MG EC tablet Take 360 mg by mouth 2 (two) times daily.   pantoprazole (PROTONIX) 40 MG tablet TAKE 1 TABLET BY MOUTH EVERY DAY (Patient taking differently: Take 40 mg by mouth daily.)   predniSONE (DELTASONE) 5 MG tablet Take 5 mg by mouth daily with breakfast.   sulfamethoxazole-trimethoprim (BACTRIM) 400-80 MG tablet Take 1 tablet by mouth every Monday, Wednesday, and Friday.   tacrolimus (PROGRAF) 1 MG capsule Take 3 mg by mouth 2 (two) times daily.   No current facility-administered medications for this visit. (Other)      REVIEW OF SYSTEMS: ROS   Negative for: Constitutional, Gastrointestinal, Neurological, Skin, Genitourinary, Musculoskeletal, HENT, Endocrine, Cardiovascular, Eyes, Respiratory, Psychiatric, Allergic/Imm, Heme/Lymph Last edited by Silvestre Moment on 02/17/2022  9:05 AM.       ALLERGIES No Known Allergies  PAST MEDICAL HISTORY Past Medical History:  Diagnosis Date   Atypical chest pain 05/30/2019   Diabetes mellitus without complication (HCC)    Elevated PSA, less than 10 ng/ml  03/31/2017   ESRD (end stage renal disease) on dialysis ([place]) 01/30/2016   ESRD on dialysis [facility])    Hypertension    Hypertension associated with diabetes ([place]) 01/30/2016   Nuclear sclerotic cataract of right eye 08/25/2020   PAD (peripheral artery disease) ([place])    Posterior subcapsular age-related cataract, right eye 08/25/2020   Type 2 diabetes mellitus ([place]) 01/30/2016   Past Surgical History:  Procedure Laterality Date   IR ANGIOGRAM EXTREMITY RIGHT  04/22/2021   IR RADIOLOGIST EVAL & MGMT  03/26/2021   IR RADIOLOGIST EVAL &  MGMT  05/27/2021   IR TIB-PERO ART ATHEREC INC PTA MOD SED  04/22/2021   IR US GUIDE VASC ACCESS RIGHT  04/22/2021   IR US GUIDE VASC ACCESS RIGHT  04/22/2021   PTCA     peripheral artery disease    FAMILY HISTORY Family History  Problem Relation Age of Onset   Healthy Mother    Liver cancer Father    Kidney disease Sister     SOCIAL HISTORY Social History   Tobacco Use   Smoking status: Former    Types: Cigarettes    Quit date: 12/13/2009    Years since quitting: 12.1   Smokeless tobacco: Never  Vaping Use   Vaping Use: Never used  Substance Use Topics   Alcohol use: No    Alcohol/week: 0.0 standard drinks   Drug use: No         OPHTHALMIC EXAM:  Base Eye Exam     Visual Acuity (ETDRS)       Right Left   Dist [place] 20/20 20/60   Dist ph [place]  20/40 -2         Tonometry (Tonopen, 9:13 AM)       Right Left   Pressure 8 8         Pupils       Pupils Dark Light Shape React APD   Right PERRL 4 4 Irregular Sluggish None   Left PERRL 4 4 Irregular Sluggish None         Visual Fields       Left Right    Full Full         Extraocular Movement       Right Left    Full Full         Neuro/Psych     Oriented x3: Yes   Mood/Affect: Normal         Dilation     Right eye: 1.0% Mydriacyl, 2.5% Phenylephrine @ 9:13 AM           Slit Lamp and Fundus Exam     External Exam       Right Left   External Normal Normal         Slit Lamp Exam       Right Left   Lids/Lashes Normal Normal   Conjunctiva/Sclera White and quiet White and quiet   Cornea Clear Clear   Anterior Chamber Deep and quiet Deep and quiet   Iris Round and reactive Round and reactive   Lens Well centered multifocal IOL Posterior chamber intraocular lens, Centered posterior chamber intraocular lens   Anterior Vitreous Normal Small remnant of oil in the anterior hyaloid         Fundus Exam       Right Left   Posterior Vitreous Clear posteriorly Clear posteriorly    Disc 1+ Optic disc atrophy, 1+ Pallor 1+ Optic disc atrophy, 1+ Pallor  C/D Ratio 0.85 0.85   Macula Microaneurysms, no macular thickening centrally, temporal less thickening Microaneurysms, Macular thickening, Mild clinically significant macular edema   Vessels PDR-quiet PDR-quiet   Periphery Good PRP peripherally, room for PRP superiorly OD with small area of NVE Good PRP peripherally, no room for PRP            IMAGING AND PROCEDURES  Imaging and Procedures for 02/17/22  OCT, Retina - OU - Both Eyes       Right Eye Quality was good. Scan locations included subfoveal. Central Foveal Thickness: 264. Progression has been stable. Findings include abnormal foveal contour, no SRF, no IRF.   Left Eye Quality was good. Scan locations included subfoveal. Central Foveal Thickness: 281. Progression has improved. Findings include abnormal foveal contour, cystoid macular edema.   Notes RECurrent CSME OD, temporal to fovea, improved contralateral eye a benefit from previous injection 6 weeks previous   CSME OS, improved OS nasal to FAZ, post recent injection, 6 weeks previous     Intravitreal Injection, Pharmacologic Agent - OD - Right Eye       Time Out 02/17/2022. 10:12 AM. Confirmed correct patient, procedure, site, and patient consented.   Anesthesia Topical anesthesia was used. Anesthetic medications included Proparacaine 0.5%.   Procedure Preparation included 10% betadine to eyelids, 5% betadine to ocular surface. A 30 gauge needle was used.   Injection: 2.5 mg bevacizumab 2.5 MG/0.1ML   Route: Intravitreal, Site: Right Eye   NDC: [phone], Lot: [id number]   Post-op Post injection exam found visual acuity of at least counting fingers. The patient tolerated the procedure well. There were no complications. The patient received written and verbal post procedure care education. Post injection medications included ocuflox.              ASSESSMENT/PLAN:  Diabetic  macular edema of right eye with proliferative retinopathy associated with type 2 diabetes mellitus (HCC) Improved CSME temporal to FAZ, as a contralateral effect from the other eye treatment 6 weeks previous.  Still active  We will treat today maintain long interval of evaluation for the right eye  Diabetic macular edema of left eye with proliferative retinopathy associated with type 2 diabetes mellitus (HCC) OS, improved 6 weeks post most recent injection, will observe for now     ICD-10-CM   1. Diabetic macular edema of right eye with proliferative retinopathy associated with type 2 diabetes mellitus (HCC)  E11.3511 OCT, Retina - OU - Both Eyes    Intravitreal Injection, Pharmacologic Agent - OD - Right Eye    bevacizumab (AVASTIN) SOSY 2.5 mg    2. Diabetic macular edema of left eye with proliferative retinopathy associated with type 2 diabetes mellitus ([place])  K02.5427       1.  OD vastly improved macular finding as a contralateral effective treatment left eye some 6 weeks previous.  Nonetheless still active CSME temporally, repeat injection today  2.  OS has improved as well 6 weeks post most recent injection we will monitor and observe  3.  Ophthalmic Meds Ordered this visit:  Meds ordered this encounter  Medications   bevacizumab (AVASTIN) SOSY 2.5 mg       Return in about 6 weeks (around 03/31/2022) for DILATE OU, AVASTIN OCT, OD.  There are no Patient Instructions on file for this visit.   Explained the diagnoses, plan, and follow up with the patient and they expressed understanding.  Patient expressed understanding of the importance of proper follow up care.  Clent Demark Lajuana Patchell M.D. Diseases & Surgery of the Retina and Vitreous Retina & Diabetic [place] 02/17/22     Abbreviations: M myopia (nearsighted); A astigmatism; H hyperopia (farsighted); P presbyopia; Mrx spectacle prescription;  CTL contact lenses; OD right eye; OS left eye; OU both eyes  XT exotropia;  ET esotropia; PEK punctate epithelial keratitis; PEE punctate epithelial erosions; DES dry eye syndrome; MGD meibomian gland dysfunction; ATs artificial tears; PFAT's preservative free artificial tears; [place] nuclear sclerotic cataract; PSC posterior subcapsular cataract; ERM epi-retinal membrane; PVD posterior vitreous detachment; RD retinal detachment; DM diabetes mellitus; DR diabetic retinopathy; NPDR non-proliferative diabetic retinopathy; PDR proliferative diabetic retinopathy; CSME clinically significant macular edema; DME diabetic macular edema; dbh dot blot hemorrhages; CWS cotton wool spot; POAG primary open angle glaucoma; C/D cup-to-disc ratio; HVF humphrey visual field; GVF goldmann visual field; OCT optical coherence tomography; IOP intraocular pressure; BRVO Branch retinal vein occlusion; CRVO central retinal vein occlusion; CRAO central retinal artery occlusion; BRAO branch retinal artery occlusion; RT retinal tear; SB scleral buckle; PPV pars plana vitrectomy; VH Vitreous hemorrhage; PRP panretinal laser photocoagulation; IVK intravitreal kenalog; VMT vitreomacular traction; MH Macular hole;  NVD neovascularization of the disc; NVE neovascularization elsewhere; AREDS age related eye disease study; ARMD age related macular degeneration; POAG primary open angle glaucoma; EBMD epithelial/anterior basement membrane dystrophy; ACIOL anterior chamber intraocular lens; IOL intraocular lens; PCIOL posterior chamber intraocular lens; Phaco/IOL phacoemulsification with intraocular lens placement; [place] photorefractive keratectomy; LASIK laser assisted in situ keratomileusis; HTN hypertension; DM diabetes mellitus; COPD chronic obstructive pulmonary disease

## 2022-02-17 NOTE — Assessment & Plan Note (Signed)
Improved CSME temporal to FAZ, as a contralateral effect from the other eye treatment 6 weeks previous.  Still active ? ?We will treat today maintain long interval of evaluation for the right eye ?

## 2022-02-17 NOTE — Assessment & Plan Note (Signed)
OS, improved 6 weeks post most recent injection, will observe for now ?

## 2022-03-31 ENCOUNTER — Encounter (INDEPENDENT_AMBULATORY_CARE_PROVIDER_SITE_OTHER): Payer: Medicaid Other | Admitting: Ophthalmology

## 2022-04-05 ENCOUNTER — Encounter (INDEPENDENT_AMBULATORY_CARE_PROVIDER_SITE_OTHER): Payer: Medicaid Other | Admitting: Ophthalmology

## 2022-04-12 ENCOUNTER — Encounter (INDEPENDENT_AMBULATORY_CARE_PROVIDER_SITE_OTHER): Payer: Medicaid Other | Admitting: Ophthalmology

## 2022-04-15 ENCOUNTER — Encounter (INDEPENDENT_AMBULATORY_CARE_PROVIDER_SITE_OTHER): Payer: Medicaid Other | Admitting: Ophthalmology

## 2022-04-15 ENCOUNTER — Encounter (INDEPENDENT_AMBULATORY_CARE_PROVIDER_SITE_OTHER): Payer: Self-pay | Admitting: Ophthalmology

## 2022-04-15 ENCOUNTER — Ambulatory Visit (INDEPENDENT_AMBULATORY_CARE_PROVIDER_SITE_OTHER): Payer: Medicaid Other | Admitting: Ophthalmology

## 2022-04-15 DIAGNOSIS — E113511 Type 2 diabetes mellitus with proliferative diabetic retinopathy with macular edema, right eye: Secondary | ICD-10-CM | POA: Diagnosis not present

## 2022-04-15 DIAGNOSIS — G4733 Obstructive sleep apnea (adult) (pediatric): Secondary | ICD-10-CM

## 2022-04-15 DIAGNOSIS — E113512 Type 2 diabetes mellitus with proliferative diabetic retinopathy with macular edema, left eye: Secondary | ICD-10-CM

## 2022-04-15 MED ORDER — BEVACIZUMAB 2.5 MG/0.1ML IZ SOSY
2.5000 mg | PREFILLED_SYRINGE | INTRAVITREAL | Status: AC | PRN
  Administered 2022-04-15: 2.5 mg via INTRAVITREAL

## 2022-04-15 NOTE — Assessment & Plan Note (Signed)
OD with a region temporal to FAZ with persistence of thickening despite control with Avastin.  We will repeat injection today to maintain and prevent encroachment on center of the [place].  May need focal laser treatment to this region for long-term control ?

## 2022-04-15 NOTE — Assessment & Plan Note (Signed)
Recurrent CSME has developed in the left eye at 34-month follow-up interval.  Will need follow-up soon for injection intravitreal Avastin OS. ? ?Bilateral macular perfusion likely affected by untreated nightly sleep apnea with its attendant macular hypoxia and episodic hypertension during the night. ?

## 2022-04-15 NOTE — Assessment & Plan Note (Addendum)
I explained the patient he was intolerant of CPAP mask but if he can find a new mask or some other way to make it work this would prevent the nightly hypoxic damage from untreated sleep apnea worsening his macular and retinal condition ?He indicated his plan to recontact with his primary care physician ?

## 2022-04-15 NOTE — Progress Notes (Signed)
? ? ?04/15/2022 ? ?  ? ?CHIEF COMPLAINT ?Patient presents for  ?Chief Complaint  ?Patient presents with  ? Diabetic Retinopathy with Macular Edema  ? ? ? ? ?HISTORY OF PRESENT ILLNESS: ?Perry Thomas is a 61 y.o. male who presents to the clinic today for:  ? ?HPI   ?6 weeks dilate OU, Avastin OCT OD. ?Denies any new floaters or FOL. ?Patient reports BS this morning before eating:188. ?Patient states vision has been a little more blurred and eyes have been watery and itchy sometimes, he thinks it might be allergies. ?Patient uses Simbrinza BID OU, patient reports he uses the drop "most of the time" twice a day. ?Last edited by Laurin Coder on 04/15/2022  2:20 PM.  ?  ? ? ?Referring physician: ?Warden Fillers, MD ?[address] ?[address] ?[place],  [place] [id number] ? ?HISTORICAL INFORMATION:  ? ?Selected notes from the [facility] ?  ? ?Lab Results  ?Component Value Date  ? HGBA1C 6.2 (A) 10/23/2020  ?  ? ?CURRENT MEDICATIONS: ?Current Outpatient Medications (Ophthalmic Drugs)  ?Medication Sig  ? SIMBRINZA 1-0.2 % SUSP Place 1 drop into both eyes in the morning and at bedtime.  ? ?No current facility-administered medications for this visit. (Ophthalmic Drugs)  ? ?Current Outpatient Medications (Other)  ?Medication Sig  ? amLODipine (NORVASC) 10 MG tablet Take 1 tablet (10 mg total) by mouth daily.  ? aspirin 81 MG EC tablet Take 81 mg by mouth daily.  ? carvedilol (COREG) 12.5 MG tablet Take 1 tablet (12.5 mg total) by mouth 2 (two) times daily with a meal.  ? clopidogrel (PLAVIX) 75 MG tablet Take 1 tablet (75 mg total) by mouth daily.  ? Continuous Blood Gluc Sensor (DEXCOM G6 SENSOR) MISC APPLY 1 SENSOR TO THE SKIN EVERY 10 DAYS FOR CONTINUOUS GLUCOSE MONITORING.  ? Continuous Blood Gluc Transmit (DEXCOM G6 TRANSMITTER) MISC USE AS DIRECTED FOR CONTINUOUS GLUCOSE MONITORING. USE FOR 90 DAYS THEN DISCARD & REPLACE  ? doxycycline (VIBRA-TABS) 100 MG tablet Take 1 tablet (100 mg total) by mouth 2 (two) times  daily.  ? doxycycline (VIBRA-TABS) 100 MG tablet Take 1 tablet (100 mg total) by mouth 2 (two) times daily.  ? Dulaglutide (TRULICITY) 2.39 RV/2.0EB SOPN Inject 0.75 mg into the muscle every Sunday.  ? furosemide (LASIX) 80 MG tablet Take 40 mg by mouth daily as needed for fluid.  ? insulin glargine (LANTUS SOLOSTAR) 100 UNIT/ML Solostar Pen Inject 12 Units into the skin at bedtime.  ? Lancets (ONETOUCH ULTRASOFT) lancets Use to check blood sugar before breakfast and before supper. E11.65  ? mycophenolate (MYFORTIC) 180 MG EC tablet Take 360 mg by mouth 2 (two) times daily.  ? pantoprazole (PROTONIX) 40 MG tablet TAKE 1 TABLET BY MOUTH EVERY DAY (Patient taking differently: Take 40 mg by mouth daily.)  ? predniSONE (DELTASONE) 5 MG tablet Take 5 mg by mouth daily with breakfast.  ? sulfamethoxazole-trimethoprim (BACTRIM) 400-80 MG tablet Take 1 tablet by mouth every Monday, Wednesday, and Friday.  ? tacrolimus (PROGRAF) 1 MG capsule Take 3 mg by mouth 2 (two) times daily.  ? ?No current facility-administered medications for this visit. (Other)  ? ? ? ? ?REVIEW OF SYSTEMS: ?ROS   ?Negative for: Constitutional, Gastrointestinal, Neurological, Skin, Genitourinary, Musculoskeletal, HENT, Endocrine, Cardiovascular, Eyes, Respiratory, Psychiatric, Allergic/Imm, Heme/Lymph ?Last edited by Hurman Horn, MD on 04/15/2022  3:09 PM.  ?  ? ? ? ?ALLERGIES ?No Known Allergies ? ?PAST MEDICAL HISTORY ?Past Medical History:  ?  Diagnosis Date  ? Atypical chest pain 05/30/2019  ? Diabetes mellitus without complication ([place])   ? Elevated PSA, less than 10 ng/ml 03/31/2017  ? ESRD (end stage renal disease) on dialysis ([place]) 01/30/2016  ? ESRD on dialysis [facility])   ? Hypertension   ? Hypertension associated with diabetes ([place]) 01/30/2016  ? Nuclear sclerotic cataract of right eye 08/25/2020  ? PAD (peripheral artery disease) ([place])   ? Posterior subcapsular age-related cataract, right eye 08/25/2020  ? Type 2 diabetes mellitus ([place]) 01/30/2016   ? ?Past Surgical History:  ?Procedure Laterality Date  ? IR ANGIOGRAM EXTREMITY RIGHT  04/22/2021  ? IR RADIOLOGIST EVAL & MGMT  03/26/2021  ? IR RADIOLOGIST EVAL & MGMT  05/27/2021  ? IR TIB-PERO ART ATHEREC INC PTA MOD SED  04/22/2021  ? IR US GUIDE VASC ACCESS RIGHT  04/22/2021  ? IR US GUIDE VASC ACCESS RIGHT  04/22/2021  ? PTCA    ? peripheral artery disease  ? ? ?FAMILY HISTORY ?Family History  ?Problem Relation Age of Onset  ? Healthy Mother   ? Liver cancer Father   ? Kidney disease Sister   ? ? ?SOCIAL HISTORY ?Social History  ? ?Tobacco Use  ? Smoking status: Former  ?  Types: Cigarettes  ?  Quit date: 12/13/2009  ?  Years since quitting: 12.3  ? Smokeless tobacco: Never  ?Vaping Use  ? Vaping Use: Never used  ?Substance Use Topics  ? Alcohol use: No  ?  Alcohol/week: 0.0 standard drinks  ? Drug use: No  ? ?  ? ?  ? ?OPHTHALMIC EXAM: ? ?Base Eye Exam   ? ? Visual Acuity (ETDRS)   ? ?   Right Left  ? Dist [place] 20/20 -1 20/50 -2  ? Dist ph [place]  20/40 -2  ? ?  ?  ? ? Tonometry (Tonopen, 2:24 PM)   ? ?   Right Left  ? Pressure 9 20  ? ?  ?  ? ? Pupils   ? ?   APD  ? Right None  ? Left None  ? ?  ?  ? ? Extraocular Movement   ? ?   Right Left  ?  Full Full  ? ?  ?  ? ? Neuro/Psych   ? ? Oriented x3: Yes  ? Mood/Affect: Normal  ? ?  ?  ? ? Dilation   ? ? Both eyes:   ? ?  ?  ? ?  ? ?Slit Lamp and Fundus Exam   ? ? External Exam   ? ?   Right Left  ? External Normal Normal  ? ?  ?  ? ? Slit Lamp Exam   ? ?   Right Left  ? Lids/Lashes Normal Normal  ? Conjunctiva/Sclera White and quiet White and quiet  ? Cornea Clear Clear  ? Anterior Chamber Deep and quiet Deep and quiet  ? Iris Round and reactive Round and reactive  ? Lens Well centered multifocal IOL Posterior chamber intraocular lens, Centered posterior chamber intraocular lens  ? Anterior Vitreous Normal Small remnant of oil in the anterior hyaloid  ? ?  ?  ? ? Fundus Exam   ? ?   Right Left  ? Posterior Vitreous Clear posteriorly Clear posteriorly  ? Disc 1+ Optic  disc atrophy, 1+ Pallor 1+ Optic disc atrophy, 1+ Pallor  ? C/D Ratio 0.85 0.85  ? Macula Microaneurysms, no macular thickening centrally, temporal less thickening Microaneurysms, Macular  thickening, Mild clinically significant macular edema  ? Vessels PDR-quiet PDR-quiet  ? Periphery Good PRP peripherally, room for PRP superiorly OD with small area of NVE Good PRP peripherally, no room for PRP  ? ?  ?  ? ?  ? ? ?IMAGING AND PROCEDURES  ?Imaging and Procedures for 04/15/22 ? ?OCT, Retina - OU - Both Eyes   ? ?   ?Right Eye ?Quality was good. Scan locations included subfoveal. Central Foveal Thickness: 264. Progression has been stable. Findings include abnormal foveal contour, no SRF, no IRF.  ? ?Left Eye ?Quality was good. Scan locations included subfoveal. Central Foveal Thickness: 300. Progression has worsened. Findings include abnormal foveal contour, cystoid macular edema.  ? ?Notes ?RECurrent CSME OD, temporal to fovea, improved contralateral eye a benefit from previous injection 8 weeks previous stable OD may need additional focal laser treatment right eye ? ? ?CSME OS, nasal to FAZ, worse today at follow-up interval post recent treatment 3 months prior OS. ? ?  ? ?Intravitreal Injection, Pharmacologic Agent - OD - Right Eye   ? ?   ?Time Out ?04/15/2022. 3:16 PM. Confirmed correct patient, procedure, site, and patient consented.  ? ?Anesthesia ?Topical anesthesia was used. Anesthetic medications included Lidocaine 4%.  ? ?Procedure ?Preparation included 10% betadine to eyelids, 5% betadine to ocular surface. A 30 gauge needle was used.  ? ?Injection: ?2.5 mg bevacizumab 2.5 MG/0.1ML ?  Route: Intravitreal, Site: Right Eye ?  [place]: 46568-127-51, Lot: [id number]  ? ?Post-op ?Post injection exam found visual acuity of at least counting fingers. The patient tolerated the procedure well. There were no complications. The patient received written and verbal post procedure care education. Post injection medications  included ocuflox.  ? ?  ? ? ?  ?  ? ?  ?ASSESSMENT/PLAN: ? ?OSA (obstructive sleep apnea) ?I explained the patient he was intolerant of CPAP mask but if he can find a new mask or some other way to make it work this

## 2022-04-21 ENCOUNTER — Ambulatory Visit: Payer: Medicaid Other | Admitting: Podiatry

## 2022-04-26 ENCOUNTER — Ambulatory Visit: Payer: Medicaid Other | Admitting: Podiatry

## 2022-04-29 ENCOUNTER — Encounter (INDEPENDENT_AMBULATORY_CARE_PROVIDER_SITE_OTHER): Payer: Medicaid Other | Admitting: Ophthalmology

## 2022-05-06 ENCOUNTER — Ambulatory Visit (INDEPENDENT_AMBULATORY_CARE_PROVIDER_SITE_OTHER): Payer: Medicaid Other | Admitting: Ophthalmology

## 2022-05-06 ENCOUNTER — Encounter (INDEPENDENT_AMBULATORY_CARE_PROVIDER_SITE_OTHER): Payer: Self-pay | Admitting: Ophthalmology

## 2022-05-06 DIAGNOSIS — G4733 Obstructive sleep apnea (adult) (pediatric): Secondary | ICD-10-CM

## 2022-05-06 DIAGNOSIS — E113511 Type 2 diabetes mellitus with proliferative diabetic retinopathy with macular edema, right eye: Secondary | ICD-10-CM

## 2022-05-06 NOTE — Assessment & Plan Note (Signed)
Focal laser applied today temporally

## 2022-05-06 NOTE — Assessment & Plan Note (Signed)
Patient recently reevaluated per my suggestion and sleep study has been scheduled soon.

## 2022-05-06 NOTE — Progress Notes (Addendum)
05/06/2022     CHIEF COMPLAINT Patient presents for  Chief Complaint  Patient presents with   Diabetic Retinopathy with Macular Edema      HISTORY OF PRESENT ILLNESS: Perry Thomas is a 61 y.o. male who presents to the clinic today for:   HPI   3 weeks for FOCAL LASER RIGHT EYE, DILATE OD. Pt stated vision has been stable since last visit. Pt denies FOL but still sees floaters in OD. Pt is still taking SIMBRINZA- 1 drop in both eyes in the morning and bedtime.   Last edited by Silvestre Moment on 05/06/2022  3:07 PM.      Referring physician: Curt Jews, PA-C [id number] [place],  [place] [id number]  HISTORICAL INFORMATION:   Selected notes from the MEDICAL RECORD NUMBER    Lab Results  Component Value Date   HGBA1C 6.2 (A) 10/23/2020     CURRENT MEDICATIONS: Current Outpatient Medications (Ophthalmic Drugs)  Medication Sig   SIMBRINZA 1-0.2 % SUSP Place 1 drop into both eyes in the morning and at bedtime.   No current facility-administered medications for this visit. (Ophthalmic Drugs)   Current Outpatient Medications (Other)  Medication Sig   amLODipine (NORVASC) 10 MG tablet Take 1 tablet (10 mg total) by mouth daily.   aspirin 81 MG EC tablet Take 81 mg by mouth daily.   carvedilol (COREG) 12.5 MG tablet Take 1 tablet (12.5 mg total) by mouth 2 (two) times daily with a meal.   clopidogrel (PLAVIX) 75 MG tablet Take 1 tablet (75 mg total) by mouth daily.   Continuous Blood Gluc Sensor (DEXCOM G6 SENSOR) MISC APPLY 1 SENSOR TO THE SKIN EVERY 10 DAYS FOR CONTINUOUS GLUCOSE MONITORING.   Continuous Blood Gluc Transmit (DEXCOM G6 TRANSMITTER) MISC USE AS DIRECTED FOR CONTINUOUS GLUCOSE MONITORING. USE FOR 90 DAYS THEN DISCARD & REPLACE   doxycycline (VIBRA-TABS) 100 MG tablet Take 1 tablet (100 mg total) by mouth 2 (two) times daily.   doxycycline (VIBRA-TABS) 100 MG tablet Take 1 tablet (100 mg total) by mouth 2 (two) times daily.   Dulaglutide  (TRULICITY) 1.24 PY/0.9XI SOPN Inject 0.75 mg into the muscle every Sunday.   furosemide (LASIX) 80 MG tablet Take 40 mg by mouth daily as needed for fluid.   insulin glargine (LANTUS SOLOSTAR) 100 UNIT/ML Solostar Pen Inject 12 Units into the skin at bedtime.   Lancets (ONETOUCH ULTRASOFT) lancets Use to check blood sugar before breakfast and before supper. E11.65   mycophenolate (MYFORTIC) 180 MG EC tablet Take 360 mg by mouth 2 (two) times daily.   pantoprazole (PROTONIX) 40 MG tablet TAKE 1 TABLET BY MOUTH EVERY DAY (Patient taking differently: Take 40 mg by mouth daily.)   predniSONE (DELTASONE) 5 MG tablet Take 5 mg by mouth daily with breakfast.   sulfamethoxazole-trimethoprim (BACTRIM) 400-80 MG tablet Take 1 tablet by mouth every Monday, Wednesday, and Friday.   tacrolimus (PROGRAF) 1 MG capsule Take 3 mg by mouth 2 (two) times daily.   No current facility-administered medications for this visit. (Other)      REVIEW OF SYSTEMS: ROS   Negative for: Constitutional, Gastrointestinal, Neurological, Skin, Genitourinary, Musculoskeletal, HENT, Endocrine, Cardiovascular, Eyes, Respiratory, Psychiatric, Allergic/Imm, Heme/Lymph Last edited by Silvestre Moment on 05/06/2022  3:07 PM.       ALLERGIES No Known Allergies  PAST MEDICAL HISTORY Past Medical History:  Diagnosis Date   Atypical chest pain 05/30/2019   Diabetes mellitus without complication ([place])  Elevated PSA, less than 10 ng/ml 03/31/2017   ESRD (end stage renal disease) on dialysis ([place]) 01/30/2016   ESRD on dialysis [facility])    Hypertension    Hypertension associated with diabetes ([place]) 01/30/2016   Nuclear sclerotic cataract of right eye 08/25/2020   PAD (peripheral artery disease) ([place])    Posterior subcapsular age-related cataract, right eye 08/25/2020   Type 2 diabetes mellitus ([place]) 01/30/2016   Past Surgical History:  Procedure Laterality Date   IR ANGIOGRAM EXTREMITY RIGHT  04/22/2021   IR RADIOLOGIST EVAL & MGMT   03/26/2021   IR RADIOLOGIST EVAL & MGMT  05/27/2021   IR TIB-PERO ART ATHEREC INC PTA MOD SED  04/22/2021   IR US GUIDE VASC ACCESS RIGHT  04/22/2021   IR US GUIDE VASC ACCESS RIGHT  04/22/2021   PTCA     peripheral artery disease    FAMILY HISTORY Family History  Problem Relation Age of Onset   Healthy Mother    Liver cancer Father    Kidney disease Sister     SOCIAL HISTORY Social History   Tobacco Use   Smoking status: Former    Types: Cigarettes    Quit date: 12/13/2009    Years since quitting: 12.4   Smokeless tobacco: Never  Vaping Use   Vaping Use: Never used  Substance Use Topics   Alcohol use: No    Alcohol/week: 0.0 standard drinks   Drug use: No         OPHTHALMIC EXAM:  Base Eye Exam     Visual Acuity (ETDRS)       Right Left   Dist [place] 20/30 20/60   Dist ph [place] 20/20 20/40 +1         Tonometry (Tonopen, 3:16 PM)       Right Left   Pressure 11 15         Pupils       Pupils APD   Right PERRL None   Left PERRL None         Visual Fields       Left Right    Full Full         Extraocular Movement       Right Left    Full Full         Neuro/Psych     Oriented x3: Yes   Mood/Affect: Normal         Dilation     Right eye: 2.5% Phenylephrine, 1.0% Mydriacyl @ 3:16 PM           Slit Lamp and Fundus Exam     External Exam       Right Left   External Normal Normal         Slit Lamp Exam       Right Left   Lids/Lashes Normal Normal   Conjunctiva/Sclera White and quiet White and quiet   Cornea Clear Clear   Anterior Chamber Deep and quiet Deep and quiet   Iris Round and reactive Round and reactive   Lens Well centered multifocal IOL Posterior chamber intraocular lens, Centered posterior chamber intraocular lens   Anterior Vitreous Normal Small remnant of oil in the anterior hyaloid         Fundus Exam       Right Left   Posterior Vitreous Clear posteriorly Clear posteriorly   Disc 1+ Optic disc  atrophy, 1+ Pallor 1+ Optic disc atrophy, 1+ Pallor   C/D Ratio 0.85 0.85  Macula Microaneurysms, no macular thickening centrally, temporal less thickening Microaneurysms, Macular thickening, Mild clinically significant macular edema   Vessels PDR-quiet PDR-quiet   Periphery Good PRP peripherally, room for PRP superiorly OD with small area of NVE Good PRP peripherally, no room for PRP            IMAGING AND PROCEDURES  Imaging and Procedures for 05/06/22  Focal Laser - OD - Right Eye       Time Out Confirmed correct patient, procedure, site, and patient consented.   Anesthesia Topical anesthesia was used. Anesthetic medications included Proparacaine 0.5%.   Laser Information The type of laser was diode. Color was yellow. The duration in seconds was 0.1. The spot size was 100 microns. Laser power was 50. Total spots was 84.   Post-op The patient tolerated the procedure well. There were no complications. The patient received written and verbal post procedure care education.   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     ICD-10-CM   1. 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 OSA (obstructive sleep apnea)  G47.33       1.  Focal laser applied temporally OD today no complications  2.  3.  Ophthalmic Meds Ordered this visit:  No orders of the defined types were placed in this encounter.      Return for As scheduled, dilate, OS, AVASTIN OCT.  There are no Patient Instructions on file for this visit.   Explained the diagnoses, plan, and follow  up with the patient and they expressed understanding.  Patient expressed understanding of the importance of proper follow up care.   Clent Demark Mairead Schwarzkopf M.D. Diseases & Surgery of the Retina and Vitreous Retina & Diabetic [place] 05/06/22     Abbreviations: M myopia (nearsighted); A astigmatism; H hyperopia (farsighted); P presbyopia; Mrx spectacle prescription;  CTL contact lenses; OD right eye; OS left eye; OU both eyes  XT exotropia; ET esotropia; PEK punctate epithelial keratitis; PEE punctate epithelial erosions; DES dry eye syndrome; MGD meibomian gland dysfunction; ATs artificial tears; PFAT's preservative free artificial tears; [place] nuclear sclerotic cataract; PSC posterior subcapsular cataract; ERM epi-retinal membrane; PVD posterior vitreous detachment; RD retinal detachment; DM diabetes mellitus; DR diabetic retinopathy; NPDR non-proliferative diabetic retinopathy; PDR proliferative diabetic retinopathy; CSME clinically significant macular edema; DME diabetic macular edema; dbh dot blot hemorrhages; CWS cotton wool spot; POAG primary open angle glaucoma; C/D cup-to-disc ratio; HVF humphrey visual field; GVF goldmann visual field; OCT optical coherence tomography; IOP intraocular pressure; BRVO Branch retinal vein occlusion; CRVO central retinal vein occlusion; CRAO central retinal artery occlusion; BRAO branch retinal artery occlusion; RT retinal tear; SB scleral buckle; PPV pars plana vitrectomy; VH Vitreous hemorrhage; PRP panretinal laser photocoagulation; IVK intravitreal kenalog; VMT vitreomacular traction; MH Macular hole;  NVD neovascularization of the disc; NVE neovascularization elsewhere; AREDS age related eye disease study; ARMD age related macular degeneration; POAG primary open angle glaucoma; EBMD epithelial/anterior basement membrane dystrophy; ACIOL anterior chamber intraocular lens; IOL intraocular lens; PCIOL posterior chamber intraocular lens; Phaco/IOL phacoemulsification with  intraocular lens placement; [place]  photorefractive keratectomy; LASIK laser assisted in situ keratomileusis; HTN hypertension; DM diabetes mellitus; COPD chronic obstructive pulmonary disease

## 2022-05-12 ENCOUNTER — Ambulatory Visit: Payer: Medicaid Other | Admitting: Podiatry

## 2022-05-17 ENCOUNTER — Encounter: Payer: Self-pay | Admitting: Podiatry

## 2022-05-17 ENCOUNTER — Ambulatory Visit (INDEPENDENT_AMBULATORY_CARE_PROVIDER_SITE_OTHER): Payer: Medicaid Other | Admitting: Podiatry

## 2022-05-17 DIAGNOSIS — B351 Tinea unguium: Secondary | ICD-10-CM

## 2022-05-17 DIAGNOSIS — M79676 Pain in unspecified toe(s): Secondary | ICD-10-CM | POA: Diagnosis not present

## 2022-05-17 DIAGNOSIS — D689 Coagulation defect, unspecified: Secondary | ICD-10-CM

## 2022-05-17 DIAGNOSIS — E119 Type 2 diabetes mellitus without complications: Secondary | ICD-10-CM

## 2022-05-17 DIAGNOSIS — N186 End stage renal disease: Secondary | ICD-10-CM

## 2022-05-17 DIAGNOSIS — I739 Peripheral vascular disease, unspecified: Secondary | ICD-10-CM

## 2022-05-18 ENCOUNTER — Encounter: Payer: Self-pay | Admitting: Podiatry

## 2022-05-18 NOTE — Progress Notes (Signed)
Patient ID: Perry Thomas, male   DOB: 1960/12/22, 61 y.o.   MRN: [id number] Complaint:  Visit Type: Patient returns to my office for continued preventative foot care services. Complaint: Patient states" my nails have grown long and thick and become painful to walk and wear shoes" Patient has been diagnosed with DM with no foot complications.  Patient has history of kidney transplant.. The patient presents for preventative foot care services. No changes to ROS.  Patient is taking plavix which causes coagulation defect.    Podiatric Exam: Vascular: dorsalis pedis and posterior tibial pulses are weakly palpable bilateral. Capillary return is immediate. Cold feet. Skin turgor WNL Absent digital hair  B/L. Sensorium: Normal Semmes Weinstein monofilament test. Normal tactile sensation bilaterally. Nail Exam: Pt has thick disfigured discolored nails with subungual debris noted bilateral entire nail hallux through fifth toenails Ulcer Exam: There is no evidence of ulcer or pre-ulcerative changes or infection. Orthopedic Exam: Muscle tone and strength are WNL. No limitations in general ROM. No crepitus or effusions noted. Foot type and digits show no abnormalities. Bony prominences are unremarkable. Skin:  Porokeratosis sub 5 left.. No infection or ulcers.  Healing skin ulcer right forefoot.  Diagnosis:  Onychomycosis, , Pain in right toe, pain in left toes    Treatment & Plan Procedures and Treatment: Consent by patient was obtained for treatment procedures. The patient understood the discussion of treatment and procedures well. All questions were answered thoroughly reviewed. Debridement of mycotic and hypertrophic toenails, 1 through 5 bilateral and clearing of subungual debris. No ulceration, no infection noted. Checked his pulses since his vascular surgery and the pulses were intact. Return Visit-Office Procedure: Patient instructed to return to the office for a follow up visit 10 weeks  for continued  evaluation and treatment.    Gardiner Barefoot DPM

## 2022-05-25 ENCOUNTER — Encounter (INDEPENDENT_AMBULATORY_CARE_PROVIDER_SITE_OTHER): Payer: Medicaid Other | Admitting: Ophthalmology

## 2022-06-10 ENCOUNTER — Encounter (INDEPENDENT_AMBULATORY_CARE_PROVIDER_SITE_OTHER): Payer: Medicaid Other | Admitting: Ophthalmology

## 2022-07-06 ENCOUNTER — Encounter (INDEPENDENT_AMBULATORY_CARE_PROVIDER_SITE_OTHER): Payer: Medicaid Other | Admitting: Ophthalmology

## 2022-07-06 ENCOUNTER — Ambulatory Visit (INDEPENDENT_AMBULATORY_CARE_PROVIDER_SITE_OTHER): Payer: Medicaid Other | Admitting: Ophthalmology

## 2022-07-06 ENCOUNTER — Encounter (INDEPENDENT_AMBULATORY_CARE_PROVIDER_SITE_OTHER): Payer: Self-pay | Admitting: Ophthalmology

## 2022-07-06 DIAGNOSIS — G4733 Obstructive sleep apnea (adult) (pediatric): Secondary | ICD-10-CM

## 2022-07-06 DIAGNOSIS — E113511 Type 2 diabetes mellitus with proliferative diabetic retinopathy with macular edema, right eye: Secondary | ICD-10-CM

## 2022-07-06 DIAGNOSIS — E113512 Type 2 diabetes mellitus with proliferative diabetic retinopathy with macular edema, left eye: Secondary | ICD-10-CM | POA: Diagnosis not present

## 2022-07-06 MED ORDER — BEVACIZUMAB 2.5 MG/0.1ML IZ SOSY
2.5000 mg | PREFILLED_SYRINGE | INTRAVITREAL | Status: AC | PRN
  Administered 2022-07-06: 2.5 mg via INTRAVITREAL

## 2022-07-06 NOTE — Assessment & Plan Note (Signed)
OS 4 repeat intravitreal Avastin today for center threatening CSME nasally.

## 2022-07-06 NOTE — Progress Notes (Signed)
07/06/2022     CHIEF COMPLAINT Patient presents for  Chief Complaint  Patient presents with   Diabetic Retinopathy with Macular Edema      HISTORY OF PRESENT ILLNESS: Perry Thomas is a 61 y.o. male who presents to the clinic today for:   HPI   2 weeks dilate OS AVASTIN OCT  Pt states his vision has been stable Pt denies any new floaters or FOL  Last edited by Morene Rankins, CMA on 07/06/2022  9:21 AM.      Referring physician: Curt Jews, PA-C [address] [place],  [place] [address]  HISTORICAL INFORMATION:   Selected notes from the MEDICAL RECORD NUMBER    Lab Results  Component Value Date   HGBA1C 6.2 (A) 10/23/2020     CURRENT MEDICATIONS: Current Outpatient Medications (Ophthalmic Drugs)  Medication Sig   SIMBRINZA 1-0.2 % SUSP Place 1 drop into both eyes in the morning and at bedtime.   No current facility-administered medications for this visit. (Ophthalmic Drugs)   Current Outpatient Medications (Other)  Medication Sig   amLODipine (NORVASC) 10 MG tablet Take 1 tablet (10 mg total) by mouth daily.   aspirin 81 MG EC tablet Take 81 mg by mouth daily.   carvedilol (COREG) 12.5 MG tablet Take 1 tablet (12.5 mg total) by mouth 2 (two) times daily with a meal.   clopidogrel (PLAVIX) 75 MG tablet Take 1 tablet (75 mg total) by mouth daily.   Continuous Blood Gluc Sensor (DEXCOM G6 SENSOR) MISC APPLY 1 SENSOR TO THE SKIN EVERY 10 DAYS FOR CONTINUOUS GLUCOSE MONITORING.   Continuous Blood Gluc Transmit (DEXCOM G6 TRANSMITTER) MISC USE AS DIRECTED FOR CONTINUOUS GLUCOSE MONITORING. USE FOR 90 DAYS THEN DISCARD & REPLACE   doxycycline (VIBRA-TABS) 100 MG tablet Take 1 tablet (100 mg total) by mouth 2 (two) times daily.   doxycycline (VIBRA-TABS) 100 MG tablet Take 1 tablet (100 mg total) by mouth 2 (two) times daily.   Dulaglutide (TRULICITY) 3.00 TM/2.2QJ SOPN Inject 0.75 mg into the muscle every Sunday.   furosemide (LASIX) 80 MG tablet Take  40 mg by mouth daily as needed for fluid.   insulin glargine (LANTUS SOLOSTAR) 100 UNIT/ML Solostar Pen Inject 12 Units into the skin at bedtime.   Lancets (ONETOUCH ULTRASOFT) lancets Use to check blood sugar before breakfast and before supper. E11.65   mycophenolate (MYFORTIC) 180 MG EC tablet Take 360 mg by mouth 2 (two) times daily.   pantoprazole (PROTONIX) 40 MG tablet TAKE 1 TABLET BY MOUTH EVERY DAY (Patient taking differently: Take 40 mg by mouth daily.)   predniSONE (DELTASONE) 5 MG tablet Take 5 mg by mouth daily with breakfast.   sulfamethoxazole-trimethoprim (BACTRIM) 400-80 MG tablet Take 1 tablet by mouth every Monday, Wednesday, and Friday.   tacrolimus (PROGRAF) 1 MG capsule Take 3 mg by mouth 2 (two) times daily.   No current facility-administered medications for this visit. (Other)      REVIEW OF SYSTEMS: ROS   Negative for: Constitutional, Gastrointestinal, Neurological, Skin, Genitourinary, Musculoskeletal, HENT, Endocrine, Cardiovascular, Eyes, Respiratory, Psychiatric, Allergic/Imm, Heme/Lymph Last edited by Hurman Horn, MD on 07/06/2022 10:01 AM.       ALLERGIES No Known Allergies  PAST MEDICAL HISTORY Past Medical History:  Diagnosis Date   Atypical chest pain 05/30/2019   Diabetes mellitus without complication (HCC)    Elevated PSA, less than 10 ng/ml 03/31/2017   ESRD (end stage renal disease) on dialysis ([place]) 01/30/2016  ESRD on dialysis [facility])    Hypertension    Hypertension associated with diabetes ([place]) 01/30/2016   Nuclear sclerotic cataract of right eye 08/25/2020   PAD (peripheral artery disease) ([place])    Posterior subcapsular age-related cataract, right eye 08/25/2020   Type 2 diabetes mellitus ([place]) 01/30/2016   Past Surgical History:  Procedure Laterality Date   IR ANGIOGRAM EXTREMITY RIGHT  04/22/2021   IR RADIOLOGIST EVAL & MGMT  03/26/2021   IR RADIOLOGIST EVAL & MGMT  05/27/2021   IR TIB-PERO ART ATHEREC INC PTA MOD SED  04/22/2021   IR  US GUIDE VASC ACCESS RIGHT  04/22/2021   IR US GUIDE VASC ACCESS RIGHT  04/22/2021   PTCA     peripheral artery disease    FAMILY HISTORY Family History  Problem Relation Age of Onset   Healthy Mother    Liver cancer Father    Kidney disease Sister     SOCIAL HISTORY Social History   Tobacco Use   Smoking status: Former    Types: Cigarettes    Quit date: 12/13/2009    Years since quitting: 12.5   Smokeless tobacco: Never  Vaping Use   Vaping Use: Never used  Substance Use Topics   Alcohol use: No    Alcohol/week: 0.0 standard drinks of alcohol   Drug use: No         OPHTHALMIC EXAM:  Base Eye Exam     Visual Acuity (ETDRS)       Right Left   Dist [place] 20/20 20/40 +3         Tonometry (Tonopen, 9:28 AM)       Right Left   Pressure 6 14         Neuro/Psych     Oriented x3: Yes   Mood/Affect: Normal         Dilation     Left eye: 2.5% Phenylephrine, 1.0% Mydriacyl @ 9:25 AM           Slit Lamp and Fundus Exam     External Exam       Right Left   External Normal Normal         Slit Lamp Exam       Right Left   Lids/Lashes Normal Normal   Conjunctiva/Sclera White and quiet White and quiet   Cornea Clear Clear   Anterior Chamber Deep and quiet Deep and quiet   Iris Round and reactive Round and reactive   Lens Well centered multifocal IOL Posterior chamber intraocular lens, Centered posterior chamber intraocular lens   Anterior Vitreous Normal Small remnant of oil in the anterior hyaloid         Fundus Exam       Right Left   Posterior Vitreous  Clear posteriorly   Disc  1+ Optic disc atrophy, 1+ Pallor   C/D Ratio  0.85   Macula  Microaneurysms, Macular thickening, Mild clinically significant macular edema   Vessels  PDR-quiet   Periphery  Good PRP peripherally, no room for PRP            IMAGING AND PROCEDURES  Imaging and Procedures for 07/06/22  OCT, Retina - OU - Both Eyes       Right Eye Quality was good.  Scan locations included subfoveal. Central Foveal Thickness: 264. Progression has been stable. Findings include no IRF, no SRF, abnormal foveal contour.   Left Eye Quality was good. Scan locations included subfoveal. Central Foveal Thickness: 276. Progression has  improved. Findings include abnormal foveal contour, cystoid macular edema.   Notes RECurrent CSME OD, temporal to fovea, overall improved today currently at 12 weeks post most recent injection observe  CSME OS, nasal to FAZ,  active today at follow-up interval post recent treatment 6 months prior OS.      Intravitreal Injection, Pharmacologic Agent - OS - Left Eye       Time Out 07/06/2022. 10:05 AM. Confirmed correct patient, procedure, site, and patient consented.   Anesthesia Topical anesthesia was used. Anesthetic medications included Lidocaine 4%.   Procedure Preparation included 5% betadine to ocular surface, 10% betadine to eyelids, Tobramycin 0.3%. A supplied needle was used.   Injection: 2.5 mg bevacizumab 2.5 MG/0.1ML   Route: Intravitreal, Site: Left Eye   NDC: [phone], Lot: [id number], Expiration date: 08/22/2022, Waste: 0 mL   Post-op Post injection exam found visual acuity of at least counting fingers. The patient tolerated the procedure well. There were no complications. The patient received written and verbal post procedure care education. Post injection medications included ocuflox.              ASSESSMENT/PLAN:  OSA (obstructive sleep apnea) Treatment with CPAP machine upon arrival commenced on 07-05-2022  Diabetic macular edema of left eye with proliferative retinopathy associated with type 2 diabetes mellitus ([place]) OS 4 repeat intravitreal Avastin today for center threatening CSME nasally.  Diabetic macular edema of right eye with proliferative retinopathy associated with type 2 diabetes mellitus ([place]) OD stable and improving post recent focal laser     ICD-10-CM   1. Proliferative  diabetic retinopathy of left eye with macular edema associated with type 2 diabetes mellitus (HCC)  Y50.3546 Intravitreal Injection, Pharmacologic Agent - OS - Left Eye    bevacizumab (AVASTIN) SOSY 2.5 mg    2. Diabetic macular edema of left eye with proliferative retinopathy associated with type 2 diabetes mellitus (HCC)  F68.1275 OCT, Retina - OU - Both Eyes    Intravitreal Injection, Pharmacologic Agent - OS - Left Eye    bevacizumab (AVASTIN) SOSY 2.5 mg    3. OSA (obstructive sleep apnea)  G47.33     4. Diabetic macular edema of right eye with proliferative retinopathy associated with type 2 diabetes mellitus (HCC)  E11.3511       1.  OD, quiescent PDR and improving CSME post recent focal laser treatment continue to observe  2.  OS 4 active CSME requiring antivegF, Avastin delivered today.  3.  Dilate OU in 3 months  Ophthalmic Meds Ordered this visit:  Meds ordered this encounter  Medications   bevacizumab (AVASTIN) SOSY 2.5 mg       Return in about 3 months (around 10/06/2022) for DILATE OU, COLOR FP,, possible Avastin OS.  There are no Patient Instructions on file for this visit.   Explained the diagnoses, plan, and follow up with the patient and they expressed understanding.  Patient expressed understanding of the importance of proper follow up care.   Clent Demark Jailynn Lavalais M.D. Diseases & Surgery of the Retina and Vitreous Retina & Diabetic [place] 07/06/22     Abbreviations: M myopia (nearsighted); A astigmatism; H hyperopia (farsighted); P presbyopia; Mrx spectacle prescription;  CTL contact lenses; OD right eye; OS left eye; OU both eyes  XT exotropia; ET esotropia; PEK punctate epithelial keratitis; PEE punctate epithelial erosions; DES dry eye syndrome; MGD meibomian gland dysfunction; ATs artificial tears; PFAT's preservative free artificial tears; [place] nuclear sclerotic cataract; PSC posterior subcapsular cataract; ERM epi-retinal  membrane; PVD posterior  vitreous detachment; RD retinal detachment; DM diabetes mellitus; DR diabetic retinopathy; NPDR non-proliferative diabetic retinopathy; PDR proliferative diabetic retinopathy; CSME clinically significant macular edema; DME diabetic macular edema; dbh dot blot hemorrhages; CWS cotton wool spot; POAG primary open angle glaucoma; C/D cup-to-disc ratio; HVF humphrey visual field; GVF goldmann visual field; OCT optical coherence tomography; IOP intraocular pressure; BRVO Branch retinal vein occlusion; CRVO central retinal vein occlusion; CRAO central retinal artery occlusion; BRAO branch retinal artery occlusion; RT retinal tear; SB scleral buckle; PPV pars plana vitrectomy; VH Vitreous hemorrhage; PRP panretinal laser photocoagulation; IVK intravitreal kenalog; VMT vitreomacular traction; MH Macular hole;  NVD neovascularization of the disc; NVE neovascularization elsewhere; AREDS age related eye disease study; ARMD age related macular degeneration; POAG primary open angle glaucoma; EBMD epithelial/anterior basement membrane dystrophy; ACIOL anterior chamber intraocular lens; IOL intraocular lens; PCIOL posterior chamber intraocular lens; Phaco/IOL phacoemulsification with intraocular lens placement; [place] photorefractive keratectomy; LASIK laser assisted in situ keratomileusis; HTN hypertension; DM diabetes mellitus; COPD chronic obstructive pulmonary disease

## 2022-07-06 NOTE — Assessment & Plan Note (Signed)
Treatment with CPAP machine upon arrival commenced on 07-05-2022

## 2022-07-06 NOTE — Assessment & Plan Note (Signed)
OD stable and improving post recent focal laser

## 2022-09-01 ENCOUNTER — Encounter: Payer: Self-pay | Admitting: Podiatry

## 2022-09-01 ENCOUNTER — Ambulatory Visit (INDEPENDENT_AMBULATORY_CARE_PROVIDER_SITE_OTHER): Payer: Medicaid Other | Admitting: Podiatry

## 2022-09-01 DIAGNOSIS — D689 Coagulation defect, unspecified: Secondary | ICD-10-CM

## 2022-09-01 DIAGNOSIS — E119 Type 2 diabetes mellitus without complications: Secondary | ICD-10-CM | POA: Diagnosis not present

## 2022-09-01 DIAGNOSIS — N186 End stage renal disease: Secondary | ICD-10-CM

## 2022-09-01 DIAGNOSIS — I739 Peripheral vascular disease, unspecified: Secondary | ICD-10-CM

## 2022-09-01 DIAGNOSIS — B351 Tinea unguium: Secondary | ICD-10-CM

## 2022-09-01 DIAGNOSIS — M79676 Pain in unspecified toe(s): Secondary | ICD-10-CM

## 2022-09-01 NOTE — Progress Notes (Signed)
Patient ID: Perry Thomas, male   DOB: [birth year], 61 y.o.   MRN: [id number] Complaint:  Visit Type: Patient returns to my office for continued preventative foot care services. Complaint: Patient states" my nails have grown long and thick and become painful to walk and wear shoes" Patient has been diagnosed with DM with no foot complications.  Patient has history of kidney transplant.. The patient presents for preventative foot care services. No changes to ROS.  Patient is taking plavix which causes coagulation defect.    Podiatric Exam: Vascular: dorsalis pedis and posterior tibial pulses are weakly palpable bilateral. Capillary return is immediate. Cold feet. Skin turgor WNL Absent digital hair  B/L. Sensorium: Normal Semmes Weinstein monofilament test. Normal tactile sensation bilaterally. Nail Exam: Pt has thick disfigured discolored nails with subungual debris noted bilateral entire nail hallux through fifth toenails Ulcer Exam: There is no evidence of ulcer or pre-ulcerative changes or infection. Orthopedic Exam: Muscle tone and strength are WNL. No limitations in general ROM. No crepitus or effusions noted. Foot type and digits show no abnormalities. Bony prominences are unremarkable. Skin:  Porokeratosis sub 5 left.. No infection or ulcers.  Healing skin ulcer right forefoot.  Diagnosis:  Onychomycosis, , Pain in right toe, pain in left toes    Treatment & Plan Procedures and Treatment: Consent by patient was obtained for treatment procedures. The patient understood the discussion of treatment and procedures well. All questions were answered thoroughly reviewed. Debridement of mycotic and hypertrophic toenails, 1 through 5 bilateral and clearing of subungual debris. No ulceration, no infection noted. Return Visit-Office Procedure: Patient instructed to return to the office for a follow up visit 10 weeks  for continued evaluation and treatment.    Gardiner Barefoot DPM

## 2022-10-06 ENCOUNTER — Encounter (INDEPENDENT_AMBULATORY_CARE_PROVIDER_SITE_OTHER): Payer: Medicaid Other | Admitting: Ophthalmology

## 2022-11-09 ENCOUNTER — Ambulatory Visit: Payer: Medicaid Other | Admitting: Podiatry

## 2022-11-10 ENCOUNTER — Ambulatory Visit: Payer: Medicaid Other | Admitting: Podiatry
